# Patient Record
Sex: Female | Born: 1940 | Race: White | Hispanic: No | Marital: Single | Smoking: Former smoker
Health system: Southern US, Community
[De-identification: ages and names within clinical notes are randomized; demographics above are authoritative.]

## PROBLEM LIST (undated history)

## (undated) DIAGNOSIS — C801 Malignant (primary) neoplasm, unspecified: Secondary | ICD-10-CM

## (undated) DIAGNOSIS — J449 Chronic obstructive pulmonary disease, unspecified: Secondary | ICD-10-CM

## (undated) DIAGNOSIS — I10 Essential (primary) hypertension: Secondary | ICD-10-CM

## (undated) DIAGNOSIS — I509 Heart failure, unspecified: Secondary | ICD-10-CM

## (undated) DIAGNOSIS — F32A Depression, unspecified: Secondary | ICD-10-CM

## (undated) DIAGNOSIS — H9190 Unspecified hearing loss, unspecified ear: Secondary | ICD-10-CM

## (undated) DIAGNOSIS — F329 Major depressive disorder, single episode, unspecified: Secondary | ICD-10-CM

## (undated) DIAGNOSIS — E119 Type 2 diabetes mellitus without complications: Secondary | ICD-10-CM

## (undated) HISTORY — DX: Heart failure, unspecified: I50.9

## (undated) HISTORY — DX: Depression, unspecified: F32.A

## (undated) HISTORY — DX: Major depressive disorder, single episode, unspecified: F32.9

## (undated) HISTORY — DX: Unspecified hearing loss, unspecified ear: H91.90

---

## 2004-04-17 ENCOUNTER — Inpatient Hospital Stay: Payer: Self-pay | Admitting: Internal Medicine

## 2004-04-17 ENCOUNTER — Other Ambulatory Visit: Payer: Self-pay

## 2004-04-18 ENCOUNTER — Other Ambulatory Visit: Payer: Self-pay

## 2004-04-20 ENCOUNTER — Other Ambulatory Visit: Payer: Self-pay

## 2004-05-06 ENCOUNTER — Inpatient Hospital Stay: Payer: Self-pay | Admitting: Surgery

## 2008-09-02 ENCOUNTER — Emergency Department: Payer: Self-pay | Admitting: Emergency Medicine

## 2009-12-23 ENCOUNTER — Ambulatory Visit: Payer: Self-pay

## 2010-04-25 ENCOUNTER — Emergency Department: Payer: Self-pay | Admitting: Emergency Medicine

## 2010-04-27 ENCOUNTER — Inpatient Hospital Stay: Payer: Self-pay | Admitting: Internal Medicine

## 2010-06-10 ENCOUNTER — Ambulatory Visit: Payer: Self-pay | Admitting: Gastroenterology

## 2010-06-14 LAB — PATHOLOGY REPORT

## 2010-07-05 ENCOUNTER — Inpatient Hospital Stay: Payer: Self-pay | Admitting: Internal Medicine

## 2010-09-09 ENCOUNTER — Inpatient Hospital Stay: Payer: Self-pay | Admitting: Internal Medicine

## 2010-10-05 ENCOUNTER — Ambulatory Visit: Payer: Self-pay | Admitting: Internal Medicine

## 2011-03-22 ENCOUNTER — Emergency Department: Payer: Self-pay | Admitting: Emergency Medicine

## 2011-03-22 LAB — CBC
HCT: 35.4 % (ref 35.0–47.0)
HGB: 11.3 g/dL — ABNORMAL LOW (ref 12.0–16.0)
MCH: 29.2 pg (ref 26.0–34.0)
RBC: 3.87 10*6/uL (ref 3.80–5.20)
RDW: 15.4 % — ABNORMAL HIGH (ref 11.5–14.5)

## 2011-03-22 LAB — BASIC METABOLIC PANEL
BUN: 17 mg/dL (ref 7–18)
Calcium, Total: 9.4 mg/dL (ref 8.5–10.1)
Creatinine: 1.05 mg/dL (ref 0.60–1.30)
EGFR (African American): >60
EGFR (Non-African Amer.): 55 — ABNORMAL LOW
Glucose: 137 mg/dL — ABNORMAL HIGH (ref 65–99)
Osmolality: 291 (ref 275–301)
Potassium: 3.9 mmol/L (ref 3.5–5.1)

## 2012-01-04 ENCOUNTER — Ambulatory Visit: Payer: Self-pay | Admitting: Ophthalmology

## 2013-04-07 ENCOUNTER — Emergency Department: Payer: Self-pay | Admitting: Emergency Medicine

## 2014-01-29 ENCOUNTER — Emergency Department: Payer: Self-pay | Admitting: Internal Medicine

## 2014-01-29 LAB — CBC
HCT: 39.5 % (ref 35.0–47.0)
HGB: 12.4 g/dL (ref 12.0–16.0)
MCH: 29.7 pg (ref 26.0–34.0)
MCHC: 31.4 g/dL — AB (ref 32.0–36.0)
MCV: 95 fL (ref 80–100)
PLATELETS: 209 10*3/uL (ref 150–440)
RBC: 4.17 10*6/uL (ref 3.80–5.20)
RDW: 14.1 % (ref 11.5–14.5)
WBC: 6.3 10*3/uL (ref 3.6–11.0)

## 2014-01-29 LAB — COMPREHENSIVE METABOLIC PANEL
ALT: 36 U/L
Albumin: 3.6 g/dL (ref 3.4–5.0)
Alkaline Phosphatase: 105 U/L
Anion Gap: 5 — ABNORMAL LOW (ref 7–16)
BILIRUBIN TOTAL: 0.2 mg/dL (ref 0.2–1.0)
BUN: 10 mg/dL (ref 7–18)
Calcium, Total: 9.2 mg/dL (ref 8.5–10.1)
Chloride: 103 mmol/L (ref 98–107)
Co2: 33 mmol/L — ABNORMAL HIGH (ref 21–32)
Creatinine: 0.88 mg/dL (ref 0.60–1.30)
EGFR (Non-African Amer.): >60
Glucose: 132 mg/dL — ABNORMAL HIGH (ref 65–99)
Osmolality: 282 (ref 275–301)
Potassium: 3.8 mmol/L (ref 3.5–5.1)
SGOT(AST): 22 U/L (ref 15–37)
Sodium: 141 mmol/L (ref 136–145)
Total Protein: 7.3 g/dL (ref 6.4–8.2)

## 2014-01-29 LAB — TROPONIN I: Troponin-I: <0.02 ng/mL

## 2014-03-09 DIAGNOSIS — J449 Chronic obstructive pulmonary disease, unspecified: Secondary | ICD-10-CM | POA: Diagnosis not present

## 2014-03-09 DIAGNOSIS — J9610 Chronic respiratory failure, unspecified whether with hypoxia or hypercapnia: Secondary | ICD-10-CM | POA: Diagnosis not present

## 2014-03-09 DIAGNOSIS — M7989 Other specified soft tissue disorders: Secondary | ICD-10-CM | POA: Diagnosis not present

## 2014-03-09 DIAGNOSIS — R0902 Hypoxemia: Secondary | ICD-10-CM | POA: Diagnosis not present

## 2014-03-11 DIAGNOSIS — G4733 Obstructive sleep apnea (adult) (pediatric): Secondary | ICD-10-CM | POA: Diagnosis not present

## 2014-03-18 DIAGNOSIS — J449 Chronic obstructive pulmonary disease, unspecified: Secondary | ICD-10-CM | POA: Diagnosis not present

## 2014-03-18 DIAGNOSIS — J9610 Chronic respiratory failure, unspecified whether with hypoxia or hypercapnia: Secondary | ICD-10-CM | POA: Diagnosis not present

## 2014-03-18 DIAGNOSIS — R0902 Hypoxemia: Secondary | ICD-10-CM | POA: Diagnosis not present

## 2014-03-18 DIAGNOSIS — M7989 Other specified soft tissue disorders: Secondary | ICD-10-CM | POA: Diagnosis not present

## 2014-03-18 DIAGNOSIS — G4733 Obstructive sleep apnea (adult) (pediatric): Secondary | ICD-10-CM | POA: Diagnosis not present

## 2014-03-26 DIAGNOSIS — E039 Hypothyroidism, unspecified: Secondary | ICD-10-CM | POA: Diagnosis not present

## 2014-03-26 DIAGNOSIS — F411 Generalized anxiety disorder: Secondary | ICD-10-CM | POA: Diagnosis not present

## 2014-03-26 DIAGNOSIS — E782 Mixed hyperlipidemia: Secondary | ICD-10-CM | POA: Diagnosis not present

## 2014-03-26 DIAGNOSIS — N390 Urinary tract infection, site not specified: Secondary | ICD-10-CM | POA: Diagnosis not present

## 2014-03-26 DIAGNOSIS — E1165 Type 2 diabetes mellitus with hyperglycemia: Secondary | ICD-10-CM | POA: Diagnosis not present

## 2014-04-09 DIAGNOSIS — M7989 Other specified soft tissue disorders: Secondary | ICD-10-CM | POA: Diagnosis not present

## 2014-04-09 DIAGNOSIS — J9610 Chronic respiratory failure, unspecified whether with hypoxia or hypercapnia: Secondary | ICD-10-CM | POA: Diagnosis not present

## 2014-04-09 DIAGNOSIS — R0902 Hypoxemia: Secondary | ICD-10-CM | POA: Diagnosis not present

## 2014-04-09 DIAGNOSIS — J449 Chronic obstructive pulmonary disease, unspecified: Secondary | ICD-10-CM | POA: Diagnosis not present

## 2014-04-11 DIAGNOSIS — G4733 Obstructive sleep apnea (adult) (pediatric): Secondary | ICD-10-CM | POA: Diagnosis not present

## 2014-05-08 DIAGNOSIS — M7989 Other specified soft tissue disorders: Secondary | ICD-10-CM | POA: Diagnosis not present

## 2014-05-08 DIAGNOSIS — J449 Chronic obstructive pulmonary disease, unspecified: Secondary | ICD-10-CM | POA: Diagnosis not present

## 2014-05-08 DIAGNOSIS — R0902 Hypoxemia: Secondary | ICD-10-CM | POA: Diagnosis not present

## 2014-05-08 DIAGNOSIS — J9610 Chronic respiratory failure, unspecified whether with hypoxia or hypercapnia: Secondary | ICD-10-CM | POA: Diagnosis not present

## 2014-05-10 DIAGNOSIS — G4733 Obstructive sleep apnea (adult) (pediatric): Secondary | ICD-10-CM | POA: Diagnosis not present

## 2014-06-08 DIAGNOSIS — R0902 Hypoxemia: Secondary | ICD-10-CM | POA: Diagnosis not present

## 2014-06-08 DIAGNOSIS — J9610 Chronic respiratory failure, unspecified whether with hypoxia or hypercapnia: Secondary | ICD-10-CM | POA: Diagnosis not present

## 2014-06-08 DIAGNOSIS — J449 Chronic obstructive pulmonary disease, unspecified: Secondary | ICD-10-CM | POA: Diagnosis not present

## 2014-06-08 DIAGNOSIS — M7989 Other specified soft tissue disorders: Secondary | ICD-10-CM | POA: Diagnosis not present

## 2014-06-10 DIAGNOSIS — G4733 Obstructive sleep apnea (adult) (pediatric): Secondary | ICD-10-CM | POA: Diagnosis not present

## 2014-07-05 DIAGNOSIS — G4733 Obstructive sleep apnea (adult) (pediatric): Secondary | ICD-10-CM | POA: Diagnosis not present

## 2014-07-08 DIAGNOSIS — J9610 Chronic respiratory failure, unspecified whether with hypoxia or hypercapnia: Secondary | ICD-10-CM | POA: Diagnosis not present

## 2014-07-08 DIAGNOSIS — M7989 Other specified soft tissue disorders: Secondary | ICD-10-CM | POA: Diagnosis not present

## 2014-07-08 DIAGNOSIS — J449 Chronic obstructive pulmonary disease, unspecified: Secondary | ICD-10-CM | POA: Diagnosis not present

## 2014-07-08 DIAGNOSIS — R0902 Hypoxemia: Secondary | ICD-10-CM | POA: Diagnosis not present

## 2014-07-10 DIAGNOSIS — G4733 Obstructive sleep apnea (adult) (pediatric): Secondary | ICD-10-CM | POA: Diagnosis not present

## 2014-07-16 DIAGNOSIS — E119 Type 2 diabetes mellitus without complications: Secondary | ICD-10-CM | POA: Diagnosis not present

## 2014-07-16 DIAGNOSIS — Z0001 Encounter for general adult medical examination with abnormal findings: Secondary | ICD-10-CM | POA: Diagnosis not present

## 2014-07-16 DIAGNOSIS — E782 Mixed hyperlipidemia: Secondary | ICD-10-CM | POA: Diagnosis not present

## 2014-07-16 DIAGNOSIS — I10 Essential (primary) hypertension: Secondary | ICD-10-CM | POA: Diagnosis not present

## 2014-07-16 DIAGNOSIS — R300 Dysuria: Secondary | ICD-10-CM | POA: Diagnosis not present

## 2014-07-16 DIAGNOSIS — E039 Hypothyroidism, unspecified: Secondary | ICD-10-CM | POA: Diagnosis not present

## 2014-07-16 DIAGNOSIS — M150 Primary generalized (osteo)arthritis: Secondary | ICD-10-CM | POA: Diagnosis not present

## 2014-08-08 DIAGNOSIS — J449 Chronic obstructive pulmonary disease, unspecified: Secondary | ICD-10-CM | POA: Diagnosis not present

## 2014-08-08 DIAGNOSIS — J9610 Chronic respiratory failure, unspecified whether with hypoxia or hypercapnia: Secondary | ICD-10-CM | POA: Diagnosis not present

## 2014-08-08 DIAGNOSIS — R0902 Hypoxemia: Secondary | ICD-10-CM | POA: Diagnosis not present

## 2014-08-08 DIAGNOSIS — M7989 Other specified soft tissue disorders: Secondary | ICD-10-CM | POA: Diagnosis not present

## 2014-08-10 DIAGNOSIS — G4733 Obstructive sleep apnea (adult) (pediatric): Secondary | ICD-10-CM | POA: Diagnosis not present

## 2014-09-07 DIAGNOSIS — J9610 Chronic respiratory failure, unspecified whether with hypoxia or hypercapnia: Secondary | ICD-10-CM | POA: Diagnosis not present

## 2014-09-07 DIAGNOSIS — M7989 Other specified soft tissue disorders: Secondary | ICD-10-CM | POA: Diagnosis not present

## 2014-09-07 DIAGNOSIS — R0902 Hypoxemia: Secondary | ICD-10-CM | POA: Diagnosis not present

## 2014-09-07 DIAGNOSIS — J449 Chronic obstructive pulmonary disease, unspecified: Secondary | ICD-10-CM | POA: Diagnosis not present

## 2014-09-09 DIAGNOSIS — G4733 Obstructive sleep apnea (adult) (pediatric): Secondary | ICD-10-CM | POA: Diagnosis not present

## 2014-10-08 DIAGNOSIS — J449 Chronic obstructive pulmonary disease, unspecified: Secondary | ICD-10-CM | POA: Diagnosis not present

## 2014-10-08 DIAGNOSIS — M7989 Other specified soft tissue disorders: Secondary | ICD-10-CM | POA: Diagnosis not present

## 2014-10-08 DIAGNOSIS — J9610 Chronic respiratory failure, unspecified whether with hypoxia or hypercapnia: Secondary | ICD-10-CM | POA: Diagnosis not present

## 2014-10-08 DIAGNOSIS — R0902 Hypoxemia: Secondary | ICD-10-CM | POA: Diagnosis not present

## 2014-10-10 DIAGNOSIS — G4733 Obstructive sleep apnea (adult) (pediatric): Secondary | ICD-10-CM | POA: Diagnosis not present

## 2014-11-08 DIAGNOSIS — J449 Chronic obstructive pulmonary disease, unspecified: Secondary | ICD-10-CM | POA: Diagnosis not present

## 2014-11-08 DIAGNOSIS — R0902 Hypoxemia: Secondary | ICD-10-CM | POA: Diagnosis not present

## 2014-11-08 DIAGNOSIS — M7989 Other specified soft tissue disorders: Secondary | ICD-10-CM | POA: Diagnosis not present

## 2014-11-08 DIAGNOSIS — J9610 Chronic respiratory failure, unspecified whether with hypoxia or hypercapnia: Secondary | ICD-10-CM | POA: Diagnosis not present

## 2014-11-10 DIAGNOSIS — G4733 Obstructive sleep apnea (adult) (pediatric): Secondary | ICD-10-CM | POA: Diagnosis not present

## 2014-11-13 DIAGNOSIS — J309 Allergic rhinitis, unspecified: Secondary | ICD-10-CM | POA: Diagnosis not present

## 2014-11-13 DIAGNOSIS — I10 Essential (primary) hypertension: Secondary | ICD-10-CM | POA: Diagnosis not present

## 2014-11-13 DIAGNOSIS — H6693 Otitis media, unspecified, bilateral: Secondary | ICD-10-CM | POA: Diagnosis not present

## 2014-11-13 DIAGNOSIS — E119 Type 2 diabetes mellitus without complications: Secondary | ICD-10-CM | POA: Diagnosis not present

## 2014-11-13 DIAGNOSIS — J449 Chronic obstructive pulmonary disease, unspecified: Secondary | ICD-10-CM | POA: Diagnosis not present

## 2014-12-08 DIAGNOSIS — J449 Chronic obstructive pulmonary disease, unspecified: Secondary | ICD-10-CM | POA: Diagnosis not present

## 2014-12-08 DIAGNOSIS — M7989 Other specified soft tissue disorders: Secondary | ICD-10-CM | POA: Diagnosis not present

## 2014-12-08 DIAGNOSIS — R0902 Hypoxemia: Secondary | ICD-10-CM | POA: Diagnosis not present

## 2014-12-08 DIAGNOSIS — J9610 Chronic respiratory failure, unspecified whether with hypoxia or hypercapnia: Secondary | ICD-10-CM | POA: Diagnosis not present

## 2014-12-10 DIAGNOSIS — G4733 Obstructive sleep apnea (adult) (pediatric): Secondary | ICD-10-CM | POA: Diagnosis not present

## 2014-12-11 ENCOUNTER — Emergency Department
Admission: EM | Admit: 2014-12-11 | Discharge: 2014-12-11 | Disposition: A | Discharge status: Home / Self Care | Payer: Medicare Other | Attending: Emergency Medicine | Admitting: Emergency Medicine

## 2014-12-11 DIAGNOSIS — Z760 Encounter for issue of repeat prescription: Secondary | ICD-10-CM | POA: Diagnosis not present

## 2014-12-11 DIAGNOSIS — Z87891 Personal history of nicotine dependence: Secondary | ICD-10-CM | POA: Diagnosis not present

## 2014-12-11 DIAGNOSIS — F419 Anxiety disorder, unspecified: Secondary | ICD-10-CM | POA: Insufficient documentation

## 2014-12-11 DIAGNOSIS — R4182 Altered mental status, unspecified: Secondary | ICD-10-CM | POA: Diagnosis not present

## 2014-12-11 MED ORDER — CLONAZEPAM 1 MG PO TABS: 1.0000 mg | ORAL_TABLET | Freq: Two times a day (BID) | ORAL | Status: DC | PRN

## 2014-12-11 MED ORDER — LORAZEPAM 2 MG/ML IJ SOLN
1.0000 mg | Freq: Once | INTRAMUSCULAR | Status: AC
  Administered 2014-12-11: 1 mg via INTRAVENOUS
  Filled 2014-12-11: qty 1

## 2014-12-11 NOTE — ED Notes (Signed)
BEHAVIORAL HEALTH ROUNDING Patient sleeping: No. Patient alert and oriented: yes Behavior appropriate: Yes.  ; If no, describe:  Nutrition and fluids offered: yes Toileting and hygiene offered: Yes  Sitter present: q15 minute observations and security camera monitoring Law enforcement present: Yes  ODS  

## 2014-12-11 NOTE — ED Provider Notes (Signed)
[facility] Emergency Department Provider Note  ____________________________________________   I have reviewed the triage vital signs and the nursing notes.   HISTORY  Chief Complaint Medication Refill    HPI Lynn Robbins is a 74 y.o. female with a history of anxiety he states that she is at the end of her 90 day course of clonazepam. She cannot get it refilled until the 30th. Today's 26. She did not have her dose this morning. She did not have it last night. She states that she did not take extra, she states that sometimes she has difficulty with her pills and they called the floor. She believed her son may have actually vacuumed some up. The patient has had no withdrawal symptoms, she feels anxious but she does not feel tremulous or markedly tachycardic. No history of seizures.  No past medical history on file.  There are no active problems to display for this patient.   No past surgical history on file.  No current outpatient prescriptions on file.  Allergies Review of patient's allergies indicates not on file.  No family history on file.  Social History Social History  Substance Use Topics  . Smoking status: Former Research scientist (life sciences)  . Smokeless tobacco: Not on file  . Alcohol Use: Not on file    Review of Systems Constitutional: No fever/chills Eyes: No visual changes. ENT: No sore throat. No stiff neck no neck pain Cardiovascular: Denies chest pain. Respiratory: Denies shortness of breath. Gastrointestinal:   no vomiting.  No diarrhea.  No constipation. Genitourinary: Negative for dysuria. Musculoskeletal: Negative lower extremity swelling Skin: Negative for rash. Neurological: Negative for headaches, focal weakness or numbness. 10-point ROS otherwise negative.  ____________________________________________   PHYSICAL EXAM:  VITAL SIGNS: ED Triage Vitals  Enc Vitals Group     BP 12/11/14 1345 123/89 mmHg     Pulse Rate 12/11/14 1345 105      Resp 12/11/14 1345 20     Temp 12/11/14 1345 98.6 F (37 C)     Temp Source 12/11/14 1345 Oral     SpO2 12/11/14 1345 97 %     Weight 12/11/14 1340 196 lb (88.905 kg)     Height 12/11/14 1340 '5\' 2"'$  (1.575 m)     Head Cir --      Peak Flow --      Pain Score --      Pain Loc --      Pain Edu? --      Excl. in [place]? --     Constitutional: Alert and oriented. Well appearing and in no acute distress. Eyes: Conjunctivae are normal. PERRL. EOMI. Head: Atraumatic. Nose: No congestion/rhinnorhea. Mouth/Throat: Mucous membranes are moist.  Oropharynx non-erythematous. Neck: No stridor.   Nontender with no meningismus Cardiovascular: Normal rate, regular rhythm. Grossly normal heart sounds.  Good peripheral circulation. Respiratory: Normal respiratory effort.  No retractions. Lungs CTAB. Gastrointestinal: Soft and nontender. No distention. No guarding no rebound Back:  There is no focal tenderness or step off there is no midline tenderness there are no lesions noted. there is no CVA tenderness Musculoskeletal: No lower extremity tenderness. No joint effusions, no DVT signs strong distal pulses no edema Neurologic:  Normal speech and language. No gross focal neurologic deficits are appreciated. No tremor noted Skin:  Skin is warm, dry and intact. No rash noted. Psychiatric: Mood and affect are normal. Speech and behavior are normal.  ____________________________________________   LABS (all labs ordered are listed, but only abnormal results  are displayed)  Labs Reviewed - No data to display ____________________________________________  EKG   ____________________________________________  RADIOLOGY   ____________________________________________   PROCEDURES  Procedure(s) performed: None  Critical Care performed: None  ____________________________________________   INITIAL IMPRESSION / ASSESSMENT AND PLAN / ED COURSE  Pertinent labs & imaging results that were  available during my care of the patient were reviewed by me and considered in my medical decision making (see chart for details).  Patient here with a few days without her Klonopin. She is at risk for withdrawal, heart rate was initially 105 at this time it is in the high 90s, and she is quite anxious. There is no tremor or anything to suggest that the patient is hypertonic or in benzodiazepine withdrawal. We will give her a dose of Ativan here, and I will see if I can send her home with a replacement prescription for that which she is missing. Advised her that this is not a routine thing that we do in the emergency room, however, I do not do it there is a risk of withdrawal. ____________________________________________   FINAL CLINICAL IMPRESSION(S) / ED DIAGNOSES  Final diagnoses:  None     Schuyler Amor, MD 12/11/14 1443

## 2014-12-11 NOTE — Discharge Instructions (Signed)
Medicine Refill at the Emergency Department We have refilled your medicine today, but it is best for you to get refills through your primary health care provider's office. In the future, please plan ahead so you do not need to get refills from the emergency department. If the medicine we refilled was a maintenance medicine, you may have received only enough to get you by until you are able to see your regular health care provider.   This information is not intended to replace advice given to you by your health care provider. Make sure you discuss any questions you have with your health care provider.  If you have shakes or chills or tremors or you feel otherwise unwell including vomiting return to the emergency room   Document Released: 05/21/2003 Document Revised: 02/22/2014 Document Reviewed: 05/11/2013 Elsevier Interactive Patient Education 2016 [employer].

## 2014-12-11 NOTE — ED Notes (Signed)

## 2014-12-11 NOTE — ED Notes (Signed)
Pt here via EMS from home  Pt reports that she is out of clonazepam and she has been out for two days  "i need something to make me feel better - I have high anxiety - I can't get my medicine refilled until the 30th and my insurance will not pay - so i came here."   Pt with loud speech throughout triage

## 2015-01-08 DIAGNOSIS — J449 Chronic obstructive pulmonary disease, unspecified: Secondary | ICD-10-CM | POA: Diagnosis not present

## 2015-01-08 DIAGNOSIS — R0902 Hypoxemia: Secondary | ICD-10-CM | POA: Diagnosis not present

## 2015-01-08 DIAGNOSIS — M7989 Other specified soft tissue disorders: Secondary | ICD-10-CM | POA: Diagnosis not present

## 2015-01-08 DIAGNOSIS — J9610 Chronic respiratory failure, unspecified whether with hypoxia or hypercapnia: Secondary | ICD-10-CM | POA: Diagnosis not present

## 2015-01-10 DIAGNOSIS — G4733 Obstructive sleep apnea (adult) (pediatric): Secondary | ICD-10-CM | POA: Diagnosis not present

## 2015-02-07 DIAGNOSIS — R0902 Hypoxemia: Secondary | ICD-10-CM | POA: Diagnosis not present

## 2015-02-07 DIAGNOSIS — J9610 Chronic respiratory failure, unspecified whether with hypoxia or hypercapnia: Secondary | ICD-10-CM | POA: Diagnosis not present

## 2015-02-07 DIAGNOSIS — J449 Chronic obstructive pulmonary disease, unspecified: Secondary | ICD-10-CM | POA: Diagnosis not present

## 2015-02-07 DIAGNOSIS — M7989 Other specified soft tissue disorders: Secondary | ICD-10-CM | POA: Diagnosis not present

## 2015-02-09 DIAGNOSIS — G4733 Obstructive sleep apnea (adult) (pediatric): Secondary | ICD-10-CM | POA: Diagnosis not present

## 2015-02-13 DIAGNOSIS — F411 Generalized anxiety disorder: Secondary | ICD-10-CM | POA: Diagnosis not present

## 2015-02-13 DIAGNOSIS — I10 Essential (primary) hypertension: Secondary | ICD-10-CM | POA: Diagnosis not present

## 2015-02-13 DIAGNOSIS — M545 Low back pain: Secondary | ICD-10-CM | POA: Diagnosis not present

## 2015-02-13 DIAGNOSIS — G2581 Restless legs syndrome: Secondary | ICD-10-CM | POA: Diagnosis not present

## 2015-02-13 DIAGNOSIS — E1165 Type 2 diabetes mellitus with hyperglycemia: Secondary | ICD-10-CM | POA: Diagnosis not present

## 2015-02-24 DIAGNOSIS — G4733 Obstructive sleep apnea (adult) (pediatric): Secondary | ICD-10-CM | POA: Diagnosis not present

## 2015-03-10 DIAGNOSIS — M7989 Other specified soft tissue disorders: Secondary | ICD-10-CM | POA: Diagnosis not present

## 2015-03-10 DIAGNOSIS — J9610 Chronic respiratory failure, unspecified whether with hypoxia or hypercapnia: Secondary | ICD-10-CM | POA: Diagnosis not present

## 2015-03-10 DIAGNOSIS — R0902 Hypoxemia: Secondary | ICD-10-CM | POA: Diagnosis not present

## 2015-03-10 DIAGNOSIS — J449 Chronic obstructive pulmonary disease, unspecified: Secondary | ICD-10-CM | POA: Diagnosis not present

## 2015-03-12 DIAGNOSIS — G4733 Obstructive sleep apnea (adult) (pediatric): Secondary | ICD-10-CM | POA: Diagnosis not present

## 2015-04-10 DIAGNOSIS — J449 Chronic obstructive pulmonary disease, unspecified: Secondary | ICD-10-CM | POA: Diagnosis not present

## 2015-04-10 DIAGNOSIS — R0902 Hypoxemia: Secondary | ICD-10-CM | POA: Diagnosis not present

## 2015-04-10 DIAGNOSIS — M7989 Other specified soft tissue disorders: Secondary | ICD-10-CM | POA: Diagnosis not present

## 2015-04-10 DIAGNOSIS — J9610 Chronic respiratory failure, unspecified whether with hypoxia or hypercapnia: Secondary | ICD-10-CM | POA: Diagnosis not present

## 2015-04-12 DIAGNOSIS — G4733 Obstructive sleep apnea (adult) (pediatric): Secondary | ICD-10-CM | POA: Diagnosis not present

## 2015-05-08 DIAGNOSIS — R0902 Hypoxemia: Secondary | ICD-10-CM | POA: Diagnosis not present

## 2015-05-08 DIAGNOSIS — M7989 Other specified soft tissue disorders: Secondary | ICD-10-CM | POA: Diagnosis not present

## 2015-05-08 DIAGNOSIS — J449 Chronic obstructive pulmonary disease, unspecified: Secondary | ICD-10-CM | POA: Diagnosis not present

## 2015-05-08 DIAGNOSIS — J9610 Chronic respiratory failure, unspecified whether with hypoxia or hypercapnia: Secondary | ICD-10-CM | POA: Diagnosis not present

## 2015-05-10 DIAGNOSIS — G4733 Obstructive sleep apnea (adult) (pediatric): Secondary | ICD-10-CM | POA: Diagnosis not present

## 2015-05-14 DIAGNOSIS — E1165 Type 2 diabetes mellitus with hyperglycemia: Secondary | ICD-10-CM | POA: Diagnosis not present

## 2015-05-28 DIAGNOSIS — G4733 Obstructive sleep apnea (adult) (pediatric): Secondary | ICD-10-CM | POA: Diagnosis not present

## 2015-06-08 DIAGNOSIS — R0902 Hypoxemia: Secondary | ICD-10-CM | POA: Diagnosis not present

## 2015-06-08 DIAGNOSIS — J9610 Chronic respiratory failure, unspecified whether with hypoxia or hypercapnia: Secondary | ICD-10-CM | POA: Diagnosis not present

## 2015-06-08 DIAGNOSIS — M7989 Other specified soft tissue disorders: Secondary | ICD-10-CM | POA: Diagnosis not present

## 2015-06-08 DIAGNOSIS — J449 Chronic obstructive pulmonary disease, unspecified: Secondary | ICD-10-CM | POA: Diagnosis not present

## 2015-06-10 DIAGNOSIS — G4733 Obstructive sleep apnea (adult) (pediatric): Secondary | ICD-10-CM | POA: Diagnosis not present

## 2015-07-08 DIAGNOSIS — M7989 Other specified soft tissue disorders: Secondary | ICD-10-CM | POA: Diagnosis not present

## 2015-07-08 DIAGNOSIS — R0902 Hypoxemia: Secondary | ICD-10-CM | POA: Diagnosis not present

## 2015-07-08 DIAGNOSIS — J449 Chronic obstructive pulmonary disease, unspecified: Secondary | ICD-10-CM | POA: Diagnosis not present

## 2015-07-08 DIAGNOSIS — J9610 Chronic respiratory failure, unspecified whether with hypoxia or hypercapnia: Secondary | ICD-10-CM | POA: Diagnosis not present

## 2015-07-10 DIAGNOSIS — G4733 Obstructive sleep apnea (adult) (pediatric): Secondary | ICD-10-CM | POA: Diagnosis not present

## 2015-07-18 DIAGNOSIS — I10 Essential (primary) hypertension: Secondary | ICD-10-CM | POA: Diagnosis not present

## 2015-07-18 DIAGNOSIS — E119 Type 2 diabetes mellitus without complications: Secondary | ICD-10-CM | POA: Diagnosis not present

## 2015-07-18 DIAGNOSIS — M545 Low back pain: Secondary | ICD-10-CM | POA: Diagnosis not present

## 2015-07-18 DIAGNOSIS — Z0001 Encounter for general adult medical examination with abnormal findings: Secondary | ICD-10-CM | POA: Diagnosis not present

## 2015-07-18 DIAGNOSIS — J449 Chronic obstructive pulmonary disease, unspecified: Secondary | ICD-10-CM | POA: Diagnosis not present

## 2015-07-18 DIAGNOSIS — Z0000 Encounter for general adult medical examination without abnormal findings: Secondary | ICD-10-CM | POA: Diagnosis not present

## 2015-07-18 DIAGNOSIS — Z9981 Dependence on supplemental oxygen: Secondary | ICD-10-CM | POA: Diagnosis not present

## 2015-08-08 DIAGNOSIS — J449 Chronic obstructive pulmonary disease, unspecified: Secondary | ICD-10-CM | POA: Diagnosis not present

## 2015-08-08 DIAGNOSIS — J9610 Chronic respiratory failure, unspecified whether with hypoxia or hypercapnia: Secondary | ICD-10-CM | POA: Diagnosis not present

## 2015-08-08 DIAGNOSIS — M7989 Other specified soft tissue disorders: Secondary | ICD-10-CM | POA: Diagnosis not present

## 2015-08-08 DIAGNOSIS — R0902 Hypoxemia: Secondary | ICD-10-CM | POA: Diagnosis not present

## 2015-08-10 DIAGNOSIS — G4733 Obstructive sleep apnea (adult) (pediatric): Secondary | ICD-10-CM | POA: Diagnosis not present

## 2015-08-12 DIAGNOSIS — J449 Chronic obstructive pulmonary disease, unspecified: Secondary | ICD-10-CM | POA: Diagnosis not present

## 2015-08-12 DIAGNOSIS — E119 Type 2 diabetes mellitus without complications: Secondary | ICD-10-CM | POA: Diagnosis not present

## 2015-08-12 DIAGNOSIS — Z9981 Dependence on supplemental oxygen: Secondary | ICD-10-CM | POA: Diagnosis not present

## 2015-08-12 DIAGNOSIS — I10 Essential (primary) hypertension: Secondary | ICD-10-CM | POA: Diagnosis not present

## 2015-08-12 DIAGNOSIS — M150 Primary generalized (osteo)arthritis: Secondary | ICD-10-CM | POA: Diagnosis not present

## 2015-08-15 ENCOUNTER — Encounter: Payer: Self-pay | Admitting: Emergency Medicine

## 2015-08-15 ENCOUNTER — Emergency Department
Admission: EM | Admit: 2015-08-15 | Discharge: 2015-08-15 | Disposition: A | Discharge status: Home / Self Care | Payer: Medicare Other | Attending: Emergency Medicine | Admitting: Emergency Medicine

## 2015-08-15 ENCOUNTER — Emergency Department: Payer: Medicare Other

## 2015-08-15 DIAGNOSIS — J449 Chronic obstructive pulmonary disease, unspecified: Secondary | ICD-10-CM | POA: Insufficient documentation

## 2015-08-15 DIAGNOSIS — E119 Type 2 diabetes mellitus without complications: Secondary | ICD-10-CM | POA: Diagnosis not present

## 2015-08-15 DIAGNOSIS — Z87891 Personal history of nicotine dependence: Secondary | ICD-10-CM | POA: Insufficient documentation

## 2015-08-15 DIAGNOSIS — M79602 Pain in left arm: Secondary | ICD-10-CM

## 2015-08-15 DIAGNOSIS — R079 Chest pain, unspecified: Secondary | ICD-10-CM

## 2015-08-15 DIAGNOSIS — R0789 Other chest pain: Secondary | ICD-10-CM | POA: Diagnosis not present

## 2015-08-15 DIAGNOSIS — M79622 Pain in left upper arm: Secondary | ICD-10-CM | POA: Diagnosis not present

## 2015-08-15 DIAGNOSIS — I10 Essential (primary) hypertension: Secondary | ICD-10-CM | POA: Diagnosis not present

## 2015-08-15 HISTORY — DX: Type 2 diabetes mellitus without complications: E11.9

## 2015-08-15 HISTORY — DX: Chronic obstructive pulmonary disease, unspecified: J44.9

## 2015-08-15 HISTORY — DX: Essential (primary) hypertension: I10

## 2015-08-15 LAB — BASIC METABOLIC PANEL
ANION GAP: 7 (ref 5–15)
BUN: 19 mg/dL (ref 6–20)
CALCIUM: 9.3 mg/dL (ref 8.9–10.3)
CO2: 35 mmol/L — AB (ref 22–32)
Chloride: 99 mmol/L — ABNORMAL LOW (ref 101–111)
Creatinine, Ser: 0.76 mg/dL (ref 0.44–1.00)
GFR calc Af Amer: >60 mL/min (ref >60)
GLUCOSE: 121 mg/dL — AB (ref 65–99)
Potassium: 3.5 mmol/L (ref 3.5–5.1)
Sodium: 141 mmol/L (ref 135–145)

## 2015-08-15 LAB — CBC
HEMATOCRIT: 36 % (ref 35.0–47.0)
HEMOGLOBIN: 11.7 g/dL — AB (ref 12.0–16.0)
MCH: 30.2 pg (ref 26.0–34.0)
MCHC: 32.4 g/dL (ref 32.0–36.0)
MCV: 93.1 fL (ref 80.0–100.0)
Platelets: 217 10*3/uL (ref 150–440)
RBC: 3.87 MIL/uL (ref 3.80–5.20)
RDW: 14.3 % (ref 11.5–14.5)
WBC: 9 10*3/uL (ref 3.6–11.0)

## 2015-08-15 LAB — TROPONIN I: Troponin I: <0.03 ng/mL (ref <0.03)

## 2015-08-15 MED ORDER — OXYCODONE-ACETAMINOPHEN 5-325 MG PO TABS
1.0000 | ORAL_TABLET | Freq: Once | ORAL | Status: AC
  Administered 2015-08-15: 1 via ORAL
  Filled 2015-08-15: qty 1

## 2015-08-15 NOTE — ED Provider Notes (Addendum)
[facility] Emergency Department Provider Note  ____________________________________________   I have reviewed the triage vital signs and the nursing notes.   HISTORY  Chief Complaint Chest Pain    HPI Lynn Robbins is a 75 y.o. female with a history of hypertension COPD home oxygen diabetes, patient has left arm pain which is been there for 2 weeks. Worse when she moves the arm when she touches ER. Is in the left upper humerus region. Patient has also some shoulder pain. This is also reproducible with touch and motion. She states she's had pain before in this area multiple times with this time is lasted for 2 weeks. His been there on intractably. Nothing makes it better. It's worse when she moves her arm or lies on that side. She denies any numbness or weakness. She denies any neck pain or other radicular symptoms. Patient also says that she sometimes has pain in her left chest wall and she can very precisely designate the area of discomfort. There is about a 2 cm area of pain, is worse when she moves her arm and worse when she touches that area. It is nonradiating. Nonexertional. She denies any calf pain or leg swelling. She is at her baseline as far as her shortness of breath is concerned, she is not having a cough or any other acute complaints. Given that she has been having these 2 pains for the last 2 weeks she was sent into the emergency room for further evaluation. She denies any history of PE or DVT leg swelling or recent travel.     Past Medical History  Diagnosis Date  . Diabetes mellitus without complication ([place])   . Hypertension   . COPD (chronic obstructive pulmonary disease) (HCC)     There are no active problems to display for this patient.   Past Surgical History  Procedure Laterality Date  . Cesarean section      No current outpatient prescriptions on file.  Allergies Review of patient's allergies indicates no known allergies.  No  family history on file.  Social History Social History  Substance Use Topics  . Smoking status: Former Research scientist (life sciences)  . Smokeless tobacco: None  . Alcohol Use: No    Review of Systems Constitutional: No fever/chills Eyes: No visual changes. ENT: No sore throat. No stiff neck no neck pain Cardiovascular: Positive chest pain. Respiratory: Denies shortness of breath. Gastrointestinal:   no vomiting.  No diarrhea.  No constipation. Genitourinary: Negative for dysuria. Musculoskeletal: Negative lower extremity swelling Skin: Negative for rash. Neurological: Negative for headaches, focal weakness or numbness. 10-point ROS otherwise negative.  ____________________________________________   PHYSICAL EXAM:  VITAL SIGNS: ED Triage Vitals  Enc Vitals Group     BP 08/15/15 1129 138/69 mmHg     Pulse Rate 08/15/15 1127 62     Resp 08/15/15 1127 16     Temp --      Temp src --      SpO2 08/15/15 1122 98 %     Weight 08/15/15 1127 215 lb 4.8 oz (97.659 kg)     Height 08/15/15 1127 '5\' 1"'$  (1.549 m)     Head Cir --      Peak Flow --      Pain Score 08/15/15 1128 9     Pain Loc --      Pain Edu? --      Excl. in [place]? --     Constitutional: Alert and oriented. Well appearing and in no acute  distress. Eyes: Conjunctivae are normal. PERRL. EOMI. Head: Atraumatic. Nose: No congestion/rhinnorhea. Mouth/Throat: Mucous membranes are moist.  Oropharynx non-erythematous. Neck: No stridor.   Nontender with no meningismus Cardiovascular: Normal rate, regular rhythm. Grossly normal heart sounds.  Good peripheral circulation. Chest: Tender to palpation left chest wall costochondral margin and also on the left pectoralis muscle. Palpation of this area reproduces the patient's pain. Patient states "ouch that's the pain right there" and pulls back. Also discomfort when I raise the arm. There is no shingles there is no erythema there is no obvious lesion there is no abscess or cellulitic changes. No flail  chest crepitus. Respiratory: Normal respiratory effort.  No retractions. Lungs CTAB. Abdominal: Soft and nontender. No distention. No guarding no rebound Back:  There is no focal tenderness or step off there is no midline tenderness there are no lesions noted. there is no CVA tenderness Musculoskeletal: Mellitus palpation in the left upper shoulder region, pain when I range the arm which reproduces all of her discomfort. Strong distal pulses compartment soft. Neurovascular intact. No lower extremity tenderness. No joint effusions, no DVT signs strong distal pulses no edema Neurologic:  Normal speech and language. No gross focal neurologic deficits are appreciated.  Skin:  Skin is warm, dry and intact. No rash noted. Psychiatric: Mood and affect are normal. Speech and behavior are normal.  ____________________________________________   LABS (all labs ordered are listed, but only abnormal results are displayed)  Labs Reviewed  BASIC METABOLIC PANEL - Abnormal; Notable for the following:    Chloride 99 (*)    CO2 35 (*)    Glucose, Bld 121 (*)    All other components within normal limits  CBC - Abnormal; Notable for the following:    Hemoglobin 11.7 (*)    All other components within normal limits  TROPONIN I   ____________________________________________  EKG  I personally interpreted any EKGs ordered by me or triage Sinus rhythm rate 65 beats per min no acute ST elevation or acute ST depression normal axis unremarkable EKG ____________________________________________  RADIOLOGY  I reviewed any imaging ordered by me or triage that were performed during my shift and, if possible, patient and/or family made aware of any abnormal findings. ____________________________________________   PROCEDURES  Procedure(s) performed: None  Critical Care performed: None  ____________________________________________   INITIAL IMPRESSION / ASSESSMENT AND PLAN / ED COURSE  Pertinent labs  & imaging results that were available during my care of the patient were reviewed by me and considered in my medical decision making (see chart for details).  Patient with very reproducible arm and chest pain, this is been there for 2 weeks. Troponin is negative EKG is reassuring vital signs are reassuring.  Cardiac enzyme is her negative despite 2 weeks of discomfort. At this time, there does not appear to be clinical evidence to support the diagnosis of pulmonary embolus, dissection, myocarditis, endocarditis, pericarditis, pericardial tamponade, acute coronary syndrome, pneumothorax, pneumonia, or any other acute intrathoracic pathology that will require admission or acute intervention. Nor is there evidence of any significant intra-abdominal pathology causing this discomfort. We have discussed also the possibility of cervical radiculopathy. At this time there is no numbness or weakness however and this will need to be worked up further as an outpatient. We'll give the patient pain medication send a second cardiac enzyme given her age and risk factors as a precaution, although again very low suspicion reassess.  ----------------------------------------- 4:27 PM on 08/15/2015 -----------------------------------------  Chest x-ray, or EKG, vital signs,  serial cardiac enzymes serial abdominal exams all reassuring. Patient pain free after a single Percocet has had uninterrupted the reproducible pain for the last 2 weeks. We will refer her back to her primary care doctor but this time she does not meet criteria for emergent admission to the hospital and she very much prefer to go home. We did discuss admission however patient prefers discharge which is not unreasonable. She will follow up close with her outpatient physician and come back for new or worrisome symptoms. ____________________________________________   FINAL CLINICAL IMPRESSION(S) / ED DIAGNOSES  Final diagnoses:  Chest pain      This  chart was dictated using voice recognition software.  Despite best efforts to proofread,  errors can occur which can change meaning.     Schuyler Amor, MD 08/15/15 [place], MD 08/15/15 2105

## 2015-08-15 NOTE — Discharge Instructions (Signed)
Chest Wall Pain °Chest wall pain is pain in or around the bones and muscles of your chest. Sometimes, an injury causes this pain. Sometimes, the cause may not be known. This pain may take several weeks or longer to get better. °HOME CARE °Pay attention to any changes in your symptoms. Take these actions to help with your pain: °· Rest as told by your doctor. °· Avoid activities that cause pain. Try not to use your chest, belly (abdominal), or side muscles to lift heavy things. °· If directed, apply ice to the painful area: °¨ Put ice in a plastic bag. °¨ Place a towel between your skin and the bag. °¨ Leave the ice on for 20 minutes, 2-3 times per day. °· Take over-the-counter and prescription medicines only as told by your doctor. °· Do not use tobacco products, including cigarettes, chewing tobacco, and e-cigarettes. If you need help quitting, ask your doctor. °· Keep all follow-up visits as told by your doctor. This is important. °GET HELP IF: °· You have a fever. °· Your chest pain gets worse. °· You have new symptoms. °GET HELP RIGHT AWAY IF: °· You feel sick to your stomach (nauseous) or you throw up (vomit). °· You feel sweaty or light-headed. °· You have a cough with phlegm (sputum) or you cough up blood. °· You are short of breath. °  °This information is not intended to replace advice given to you by your health care provider. Make sure you discuss any questions you have with your health care provider. °  °Document Released: 07/21/2007 Document Revised: 10/23/2014 Document Reviewed: 04/29/2014 °Elsevier Interactive Patient Education ©2016 Else[employer] Inc[employer] ° °

## 2015-08-15 NOTE — ED Notes (Signed)
Patient brought in by [facility] from home for chest pain that radiates into the left arm for the past 2 weeks. Patient was seen by her PCP and was given medication but does not remember what it was. Patient is not currently in any distress but appears to be anxious.

## 2015-08-29 DIAGNOSIS — G4733 Obstructive sleep apnea (adult) (pediatric): Secondary | ICD-10-CM | POA: Diagnosis not present

## 2015-09-07 DIAGNOSIS — J9610 Chronic respiratory failure, unspecified whether with hypoxia or hypercapnia: Secondary | ICD-10-CM | POA: Diagnosis not present

## 2015-09-07 DIAGNOSIS — J449 Chronic obstructive pulmonary disease, unspecified: Secondary | ICD-10-CM | POA: Diagnosis not present

## 2015-09-07 DIAGNOSIS — R0902 Hypoxemia: Secondary | ICD-10-CM | POA: Diagnosis not present

## 2015-09-07 DIAGNOSIS — M7989 Other specified soft tissue disorders: Secondary | ICD-10-CM | POA: Diagnosis not present

## 2015-09-09 DIAGNOSIS — G4733 Obstructive sleep apnea (adult) (pediatric): Secondary | ICD-10-CM | POA: Diagnosis not present

## 2015-10-08 DIAGNOSIS — J9610 Chronic respiratory failure, unspecified whether with hypoxia or hypercapnia: Secondary | ICD-10-CM | POA: Diagnosis not present

## 2015-10-08 DIAGNOSIS — J449 Chronic obstructive pulmonary disease, unspecified: Secondary | ICD-10-CM | POA: Diagnosis not present

## 2015-10-08 DIAGNOSIS — M7989 Other specified soft tissue disorders: Secondary | ICD-10-CM | POA: Diagnosis not present

## 2015-10-08 DIAGNOSIS — R0902 Hypoxemia: Secondary | ICD-10-CM | POA: Diagnosis not present

## 2015-10-10 DIAGNOSIS — G4733 Obstructive sleep apnea (adult) (pediatric): Secondary | ICD-10-CM | POA: Diagnosis not present

## 2015-10-24 DIAGNOSIS — J449 Chronic obstructive pulmonary disease, unspecified: Secondary | ICD-10-CM | POA: Diagnosis not present

## 2015-10-24 DIAGNOSIS — I10 Essential (primary) hypertension: Secondary | ICD-10-CM | POA: Diagnosis not present

## 2015-10-24 DIAGNOSIS — E119 Type 2 diabetes mellitus without complications: Secondary | ICD-10-CM | POA: Diagnosis not present

## 2015-10-24 DIAGNOSIS — M150 Primary generalized (osteo)arthritis: Secondary | ICD-10-CM | POA: Diagnosis not present

## 2015-11-08 DIAGNOSIS — J449 Chronic obstructive pulmonary disease, unspecified: Secondary | ICD-10-CM | POA: Diagnosis not present

## 2015-11-08 DIAGNOSIS — R0902 Hypoxemia: Secondary | ICD-10-CM | POA: Diagnosis not present

## 2015-11-08 DIAGNOSIS — M7989 Other specified soft tissue disorders: Secondary | ICD-10-CM | POA: Diagnosis not present

## 2015-11-08 DIAGNOSIS — J9610 Chronic respiratory failure, unspecified whether with hypoxia or hypercapnia: Secondary | ICD-10-CM | POA: Diagnosis not present

## 2015-11-10 DIAGNOSIS — G4733 Obstructive sleep apnea (adult) (pediatric): Secondary | ICD-10-CM | POA: Diagnosis not present

## 2015-12-06 DIAGNOSIS — G4733 Obstructive sleep apnea (adult) (pediatric): Secondary | ICD-10-CM | POA: Diagnosis not present

## 2015-12-08 DIAGNOSIS — R0902 Hypoxemia: Secondary | ICD-10-CM | POA: Diagnosis not present

## 2015-12-08 DIAGNOSIS — M7989 Other specified soft tissue disorders: Secondary | ICD-10-CM | POA: Diagnosis not present

## 2015-12-08 DIAGNOSIS — J449 Chronic obstructive pulmonary disease, unspecified: Secondary | ICD-10-CM | POA: Diagnosis not present

## 2015-12-08 DIAGNOSIS — J9610 Chronic respiratory failure, unspecified whether with hypoxia or hypercapnia: Secondary | ICD-10-CM | POA: Diagnosis not present

## 2015-12-10 DIAGNOSIS — G4733 Obstructive sleep apnea (adult) (pediatric): Secondary | ICD-10-CM | POA: Diagnosis not present

## 2015-12-29 DIAGNOSIS — E1165 Type 2 diabetes mellitus with hyperglycemia: Secondary | ICD-10-CM | POA: Diagnosis not present

## 2015-12-29 DIAGNOSIS — I10 Essential (primary) hypertension: Secondary | ICD-10-CM | POA: Diagnosis not present

## 2015-12-29 DIAGNOSIS — Z23 Encounter for immunization: Secondary | ICD-10-CM | POA: Diagnosis not present

## 2015-12-29 DIAGNOSIS — E039 Hypothyroidism, unspecified: Secondary | ICD-10-CM | POA: Diagnosis not present

## 2015-12-29 DIAGNOSIS — E782 Mixed hyperlipidemia: Secondary | ICD-10-CM | POA: Diagnosis not present

## 2016-01-05 ENCOUNTER — Other Ambulatory Visit: Payer: Self-pay

## 2016-01-05 ENCOUNTER — Ambulatory Visit: Payer: Self-pay | Admitting: Surgery

## 2016-01-08 DIAGNOSIS — R0902 Hypoxemia: Secondary | ICD-10-CM | POA: Diagnosis not present

## 2016-01-08 DIAGNOSIS — J9610 Chronic respiratory failure, unspecified whether with hypoxia or hypercapnia: Secondary | ICD-10-CM | POA: Diagnosis not present

## 2016-01-08 DIAGNOSIS — M7989 Other specified soft tissue disorders: Secondary | ICD-10-CM | POA: Diagnosis not present

## 2016-01-08 DIAGNOSIS — J449 Chronic obstructive pulmonary disease, unspecified: Secondary | ICD-10-CM | POA: Diagnosis not present

## 2016-01-10 DIAGNOSIS — G4733 Obstructive sleep apnea (adult) (pediatric): Secondary | ICD-10-CM | POA: Diagnosis not present

## 2016-02-07 DIAGNOSIS — J449 Chronic obstructive pulmonary disease, unspecified: Secondary | ICD-10-CM | POA: Diagnosis not present

## 2016-02-07 DIAGNOSIS — M7989 Other specified soft tissue disorders: Secondary | ICD-10-CM | POA: Diagnosis not present

## 2016-02-07 DIAGNOSIS — R0902 Hypoxemia: Secondary | ICD-10-CM | POA: Diagnosis not present

## 2016-02-07 DIAGNOSIS — J9610 Chronic respiratory failure, unspecified whether with hypoxia or hypercapnia: Secondary | ICD-10-CM | POA: Diagnosis not present

## 2016-02-09 DIAGNOSIS — G4733 Obstructive sleep apnea (adult) (pediatric): Secondary | ICD-10-CM | POA: Diagnosis not present

## 2016-03-08 DIAGNOSIS — G4733 Obstructive sleep apnea (adult) (pediatric): Secondary | ICD-10-CM | POA: Diagnosis not present

## 2016-03-09 DIAGNOSIS — J449 Chronic obstructive pulmonary disease, unspecified: Secondary | ICD-10-CM | POA: Diagnosis not present

## 2016-03-09 DIAGNOSIS — R0902 Hypoxemia: Secondary | ICD-10-CM | POA: Diagnosis not present

## 2016-03-09 DIAGNOSIS — M7989 Other specified soft tissue disorders: Secondary | ICD-10-CM | POA: Diagnosis not present

## 2016-03-09 DIAGNOSIS — J9610 Chronic respiratory failure, unspecified whether with hypoxia or hypercapnia: Secondary | ICD-10-CM | POA: Diagnosis not present

## 2016-03-11 DIAGNOSIS — G4733 Obstructive sleep apnea (adult) (pediatric): Secondary | ICD-10-CM | POA: Diagnosis not present

## 2016-04-09 DIAGNOSIS — R0902 Hypoxemia: Secondary | ICD-10-CM | POA: Diagnosis not present

## 2016-04-09 DIAGNOSIS — M7989 Other specified soft tissue disorders: Secondary | ICD-10-CM | POA: Diagnosis not present

## 2016-04-09 DIAGNOSIS — J9610 Chronic respiratory failure, unspecified whether with hypoxia or hypercapnia: Secondary | ICD-10-CM | POA: Diagnosis not present

## 2016-04-09 DIAGNOSIS — J449 Chronic obstructive pulmonary disease, unspecified: Secondary | ICD-10-CM | POA: Diagnosis not present

## 2016-04-11 DIAGNOSIS — G4733 Obstructive sleep apnea (adult) (pediatric): Secondary | ICD-10-CM | POA: Diagnosis not present

## 2016-05-07 DIAGNOSIS — J9610 Chronic respiratory failure, unspecified whether with hypoxia or hypercapnia: Secondary | ICD-10-CM | POA: Diagnosis not present

## 2016-05-07 DIAGNOSIS — R0902 Hypoxemia: Secondary | ICD-10-CM | POA: Diagnosis not present

## 2016-05-07 DIAGNOSIS — M7989 Other specified soft tissue disorders: Secondary | ICD-10-CM | POA: Diagnosis not present

## 2016-05-07 DIAGNOSIS — J449 Chronic obstructive pulmonary disease, unspecified: Secondary | ICD-10-CM | POA: Diagnosis not present

## 2016-05-09 DIAGNOSIS — G4733 Obstructive sleep apnea (adult) (pediatric): Secondary | ICD-10-CM | POA: Diagnosis not present

## 2016-05-18 DIAGNOSIS — M545 Low back pain: Secondary | ICD-10-CM | POA: Diagnosis not present

## 2016-05-18 DIAGNOSIS — E1165 Type 2 diabetes mellitus with hyperglycemia: Secondary | ICD-10-CM | POA: Diagnosis not present

## 2016-05-18 DIAGNOSIS — J449 Chronic obstructive pulmonary disease, unspecified: Secondary | ICD-10-CM | POA: Diagnosis not present

## 2016-05-18 DIAGNOSIS — Z9981 Dependence on supplemental oxygen: Secondary | ICD-10-CM | POA: Diagnosis not present

## 2016-05-18 DIAGNOSIS — I10 Essential (primary) hypertension: Secondary | ICD-10-CM | POA: Diagnosis not present

## 2016-06-07 DIAGNOSIS — R0902 Hypoxemia: Secondary | ICD-10-CM | POA: Diagnosis not present

## 2016-06-07 DIAGNOSIS — J449 Chronic obstructive pulmonary disease, unspecified: Secondary | ICD-10-CM | POA: Diagnosis not present

## 2016-06-07 DIAGNOSIS — J9610 Chronic respiratory failure, unspecified whether with hypoxia or hypercapnia: Secondary | ICD-10-CM | POA: Diagnosis not present

## 2016-06-07 DIAGNOSIS — M7989 Other specified soft tissue disorders: Secondary | ICD-10-CM | POA: Diagnosis not present

## 2016-06-09 DIAGNOSIS — G4733 Obstructive sleep apnea (adult) (pediatric): Secondary | ICD-10-CM | POA: Diagnosis not present

## 2016-07-07 DIAGNOSIS — R0902 Hypoxemia: Secondary | ICD-10-CM | POA: Diagnosis not present

## 2016-07-07 DIAGNOSIS — J9610 Chronic respiratory failure, unspecified whether with hypoxia or hypercapnia: Secondary | ICD-10-CM | POA: Diagnosis not present

## 2016-07-07 DIAGNOSIS — J449 Chronic obstructive pulmonary disease, unspecified: Secondary | ICD-10-CM | POA: Diagnosis not present

## 2016-07-07 DIAGNOSIS — M7989 Other specified soft tissue disorders: Secondary | ICD-10-CM | POA: Diagnosis not present

## 2016-07-09 DIAGNOSIS — G4733 Obstructive sleep apnea (adult) (pediatric): Secondary | ICD-10-CM | POA: Diagnosis not present

## 2016-08-03 DIAGNOSIS — J449 Chronic obstructive pulmonary disease, unspecified: Secondary | ICD-10-CM | POA: Diagnosis not present

## 2016-08-03 DIAGNOSIS — G2581 Restless legs syndrome: Secondary | ICD-10-CM | POA: Diagnosis not present

## 2016-08-03 DIAGNOSIS — Z0001 Encounter for general adult medical examination with abnormal findings: Secondary | ICD-10-CM | POA: Diagnosis not present

## 2016-08-03 DIAGNOSIS — E1165 Type 2 diabetes mellitus with hyperglycemia: Secondary | ICD-10-CM | POA: Diagnosis not present

## 2016-08-03 DIAGNOSIS — I10 Essential (primary) hypertension: Secondary | ICD-10-CM | POA: Diagnosis not present

## 2016-08-03 DIAGNOSIS — Z9981 Dependence on supplemental oxygen: Secondary | ICD-10-CM | POA: Diagnosis not present

## 2016-08-07 DIAGNOSIS — J449 Chronic obstructive pulmonary disease, unspecified: Secondary | ICD-10-CM | POA: Diagnosis not present

## 2016-08-07 DIAGNOSIS — R0902 Hypoxemia: Secondary | ICD-10-CM | POA: Diagnosis not present

## 2016-08-07 DIAGNOSIS — M7989 Other specified soft tissue disorders: Secondary | ICD-10-CM | POA: Diagnosis not present

## 2016-08-07 DIAGNOSIS — J9610 Chronic respiratory failure, unspecified whether with hypoxia or hypercapnia: Secondary | ICD-10-CM | POA: Diagnosis not present

## 2016-08-09 DIAGNOSIS — G4733 Obstructive sleep apnea (adult) (pediatric): Secondary | ICD-10-CM | POA: Diagnosis not present

## 2016-09-06 DIAGNOSIS — J9610 Chronic respiratory failure, unspecified whether with hypoxia or hypercapnia: Secondary | ICD-10-CM | POA: Diagnosis not present

## 2016-09-06 DIAGNOSIS — J449 Chronic obstructive pulmonary disease, unspecified: Secondary | ICD-10-CM | POA: Diagnosis not present

## 2016-09-06 DIAGNOSIS — M7989 Other specified soft tissue disorders: Secondary | ICD-10-CM | POA: Diagnosis not present

## 2016-09-06 DIAGNOSIS — R0902 Hypoxemia: Secondary | ICD-10-CM | POA: Diagnosis not present

## 2016-09-08 DIAGNOSIS — G4733 Obstructive sleep apnea (adult) (pediatric): Secondary | ICD-10-CM | POA: Diagnosis not present

## 2016-09-10 DIAGNOSIS — G4733 Obstructive sleep apnea (adult) (pediatric): Secondary | ICD-10-CM | POA: Diagnosis not present

## 2016-10-07 DIAGNOSIS — R0902 Hypoxemia: Secondary | ICD-10-CM | POA: Diagnosis not present

## 2016-10-07 DIAGNOSIS — J9610 Chronic respiratory failure, unspecified whether with hypoxia or hypercapnia: Secondary | ICD-10-CM | POA: Diagnosis not present

## 2016-10-07 DIAGNOSIS — J449 Chronic obstructive pulmonary disease, unspecified: Secondary | ICD-10-CM | POA: Diagnosis not present

## 2016-10-07 DIAGNOSIS — M7989 Other specified soft tissue disorders: Secondary | ICD-10-CM | POA: Diagnosis not present

## 2016-10-09 DIAGNOSIS — G4733 Obstructive sleep apnea (adult) (pediatric): Secondary | ICD-10-CM | POA: Diagnosis not present

## 2016-11-05 DIAGNOSIS — Z9981 Dependence on supplemental oxygen: Secondary | ICD-10-CM | POA: Diagnosis not present

## 2016-11-05 DIAGNOSIS — J449 Chronic obstructive pulmonary disease, unspecified: Secondary | ICD-10-CM | POA: Diagnosis not present

## 2016-11-05 DIAGNOSIS — I10 Essential (primary) hypertension: Secondary | ICD-10-CM | POA: Diagnosis not present

## 2016-11-05 DIAGNOSIS — E119 Type 2 diabetes mellitus without complications: Secondary | ICD-10-CM | POA: Diagnosis not present

## 2016-11-07 DIAGNOSIS — J449 Chronic obstructive pulmonary disease, unspecified: Secondary | ICD-10-CM | POA: Diagnosis not present

## 2016-11-07 DIAGNOSIS — R0902 Hypoxemia: Secondary | ICD-10-CM | POA: Diagnosis not present

## 2016-11-07 DIAGNOSIS — M7989 Other specified soft tissue disorders: Secondary | ICD-10-CM | POA: Diagnosis not present

## 2016-11-07 DIAGNOSIS — J9610 Chronic respiratory failure, unspecified whether with hypoxia or hypercapnia: Secondary | ICD-10-CM | POA: Diagnosis not present

## 2016-11-09 DIAGNOSIS — G4733 Obstructive sleep apnea (adult) (pediatric): Secondary | ICD-10-CM | POA: Diagnosis not present

## 2016-11-25 DIAGNOSIS — R0600 Dyspnea, unspecified: Secondary | ICD-10-CM | POA: Diagnosis not present

## 2016-11-25 DIAGNOSIS — J449 Chronic obstructive pulmonary disease, unspecified: Secondary | ICD-10-CM | POA: Diagnosis not present

## 2016-12-07 DIAGNOSIS — R0902 Hypoxemia: Secondary | ICD-10-CM | POA: Diagnosis not present

## 2016-12-07 DIAGNOSIS — J449 Chronic obstructive pulmonary disease, unspecified: Secondary | ICD-10-CM | POA: Diagnosis not present

## 2016-12-07 DIAGNOSIS — J9610 Chronic respiratory failure, unspecified whether with hypoxia or hypercapnia: Secondary | ICD-10-CM | POA: Diagnosis not present

## 2016-12-07 DIAGNOSIS — M7989 Other specified soft tissue disorders: Secondary | ICD-10-CM | POA: Diagnosis not present

## 2016-12-09 DIAGNOSIS — G4733 Obstructive sleep apnea (adult) (pediatric): Secondary | ICD-10-CM | POA: Diagnosis not present

## 2016-12-14 DIAGNOSIS — G4733 Obstructive sleep apnea (adult) (pediatric): Secondary | ICD-10-CM | POA: Diagnosis not present

## 2017-01-07 DIAGNOSIS — R0902 Hypoxemia: Secondary | ICD-10-CM | POA: Diagnosis not present

## 2017-01-07 DIAGNOSIS — M7989 Other specified soft tissue disorders: Secondary | ICD-10-CM | POA: Diagnosis not present

## 2017-01-07 DIAGNOSIS — J449 Chronic obstructive pulmonary disease, unspecified: Secondary | ICD-10-CM | POA: Diagnosis not present

## 2017-01-07 DIAGNOSIS — J9610 Chronic respiratory failure, unspecified whether with hypoxia or hypercapnia: Secondary | ICD-10-CM | POA: Diagnosis not present

## 2017-01-09 DIAGNOSIS — G4733 Obstructive sleep apnea (adult) (pediatric): Secondary | ICD-10-CM | POA: Diagnosis not present

## 2017-02-03 ENCOUNTER — Ambulatory Visit (INDEPENDENT_AMBULATORY_CARE_PROVIDER_SITE_OTHER): Payer: Medicare Other | Admitting: Nurse Practitioner

## 2017-02-03 ENCOUNTER — Encounter: Payer: Self-pay | Admitting: Nurse Practitioner

## 2017-02-03 VITALS — BP 148/91 | HR 80 | Ht 60.0 in | Wt 194.0 lb

## 2017-02-03 DIAGNOSIS — E782 Mixed hyperlipidemia: Secondary | ICD-10-CM

## 2017-02-03 DIAGNOSIS — E039 Hypothyroidism, unspecified: Secondary | ICD-10-CM | POA: Diagnosis not present

## 2017-02-03 DIAGNOSIS — G894 Chronic pain syndrome: Secondary | ICD-10-CM

## 2017-02-03 DIAGNOSIS — E1165 Type 2 diabetes mellitus with hyperglycemia: Secondary | ICD-10-CM

## 2017-02-03 DIAGNOSIS — F411 Generalized anxiety disorder: Secondary | ICD-10-CM

## 2017-02-03 DIAGNOSIS — R300 Dysuria: Secondary | ICD-10-CM | POA: Insufficient documentation

## 2017-02-03 LAB — POCT GLYCOSYLATED HEMOGLOBIN (HGB A1C): Hemoglobin A1C: 6.3

## 2017-02-03 MED ORDER — HYDROCODONE-ACETAMINOPHEN 5-325 MG PO TABS: 1.0000 | ORAL_TABLET | Freq: Two times a day (BID) | ORAL | 0 refills | Status: DC

## 2017-02-03 MED ORDER — CLONAZEPAM 1 MG PO TABS: 1.0000 mg | ORAL_TABLET | Freq: Two times a day (BID) | ORAL | 2 refills | Status: DC | PRN

## 2017-02-03 NOTE — Progress Notes (Signed)
Subjective:     Patient ID: Lynn Robbins, female   DOB: 1940-09-23, 76 y.o.   MRN: [id number]  Skin lesion to right outer ear due to oxygen cannula rbbing on skin. Tried to use a band-aid to help, but this prevented her from being able to tighten the cannula. Tried to put pandaid on the ear to relieve pressure, but this made her cannula more uncomfortable.  Blood sugars doing well.  She suffers from chronic low back and hip pain. For this she does take hydrocodone/APAP 5/325mg  tablets, which improves her pain from 8/10 in severity to 4/10 in severity. This allows her to continue to participate in normal daily activities.    Current Outpatient Medications:  .  bisoprolol (ZEBETA) 5 MG tablet, Take 1 tablet by mouth daily., Disp: , Rfl: 2 .  glimepiride (AMARYL) 2 MG tablet, Take 2 mg by mouth daily with breakfast., Disp: , Rfl: 2 .  HYDROcodone-acetaminophen (NORCO/VICODIN) 5-325 MG tablet, Take 1 tablet by mouth 2 (two) times daily., Disp: 60 tablet, Rfl: 0 .  JANUMET XR 50-500 MG TB24, Take 1 tablet by mouth daily., Disp: , Rfl: 3 .  losartan-hydrochlorothiazide (HYZAAR) 50-12.5 MG tablet, Take 1 tablet by mouth daily., Disp: , Rfl: 0 .  naproxen (NAPROSYN) 500 MG tablet, Take 1 tablet by mouth 2 (two) times daily., Disp: , Rfl: 1 .  PROAIR HFA 108 (90 Base) MCG/ACT inhaler, Inhale 2 puffs into the lungs every 4 (four) hours as needed., Disp: , Rfl: 2 .  SPIRIVA HANDIHALER 18 MCG inhalation capsule, Take 1 capsule by mouth daily., Disp: , Rfl: 2 .  clonazePAM (KLONOPIN) 1 MG tablet, Take 1 tablet (1 mg total) by mouth 2 (two) times daily as needed for anxiety., Disp: 60 tablet, Rfl: 2   Review of Systems  Constitutional: Negative.   HENT: Negative.   Respiratory: Positive for shortness of breath.        Uses nasal cannula oxygen at all times   Cardiovascular: Negative for chest pain and palpitations.  Gastrointestinal: Negative.  Negative for abdominal pain, constipation and diarrhea.    Endocrine:       Blood sugars are doing well.   Genitourinary: Negative.   Musculoskeletal: Positive for arthralgias and back pain.  Skin:       Skin lesion along the upper right ear, she thinks due to he ocygen cannula rubbing against skin.   Allergic/Immunologic: Negative.   Neurological: Negative.   Hematological: Negative.   Psychiatric/Behavioral: The patient is nervous/anxious.    Today's Vitals   02/03/17 1143  BP: (!) 148/91  Pulse: 80  SpO2: 92%  Weight: 194 lb (88 kg)  Height: 5' (1.524 m)      Objective:   Physical Exam  Constitutional: She is oriented to person, place, and time. Vital signs are normal. She appears well-developed and well-nourished.  HENT:  Head: Normocephalic.  Right Ear: Right ear exhibits lacerations.  Ears:  Eyes: Pupils are equal, round, and reactive to light.  Neck: Normal range of motion. Neck supple.  Cardiovascular: Normal rate and regular rhythm.  Pulmonary/Chest: Effort normal and breath sounds normal. No respiratory distress. She has no wheezes.  She is using 2 liters ocygen via nasal cannula at all times.   Abdominal: Soft. There is no tenderness.  Musculoskeletal:  She has chronic lower back pain, which radiates into both hips. Pain is more severe when changing positions, especially when going from seated to standing position. There are no visible or palpable  abnormalities noted at this time. She uses a walker to help with ambulating.   Neurological: She is alert and oriented to person, place, and time.  Skin: Skin is warm and dry.  Psychiatric: Her speech is normal. Judgment and thought content normal. Her mood appears anxious. She is hyperactive. Cognition and memory are normal.  Nursing note and vitals reviewed.      Assessment:     Type 2 diabetes mellitus with hyperglycemia, without long-term current use of insulin (HCC) - Plan: POCT HgB A1C  Chronic pain disorder - Plan: HYDROcodone-acetaminophen (NORCO/VICODIN) 5-325 MG  tablet, DISCONTINUED: HYDROcodone-acetaminophen (NORCO/VICODIN) 5-325 MG tablet, DISCONTINUED: HYDROcodone-acetaminophen (NORCO/VICODIN) 5-325 MG tablet  Hypothyroidism, unspecified type  Generalized anxiety disorder - Plan: clonazePAM (KLONOPIN) 1 MG tablet  Mixed hyperlipidemia     Plan:     1. DM2 - HgbA1c 6.3 today. Continue all diabetic medications as prescribed.  2. Chronic pain - renew hydrocodone/APAP 5/325mg  tablets twice daily prn pain. Three 30 day prescriptions provided. Dates were 02/19/2017, 03/20/2017, and 04/17/2017 3. Hypothyroid - stable. Continue levothyroxine as prescribed  4. GAD - continue clonazepam as prescribed. New prescription sent to pharmacy.  5. hyperlipideima - continue cholesterol medication as prescribed   Follow up 3-4 months and sooner if needed

## 2017-02-06 DIAGNOSIS — J9610 Chronic respiratory failure, unspecified whether with hypoxia or hypercapnia: Secondary | ICD-10-CM | POA: Diagnosis not present

## 2017-02-06 DIAGNOSIS — R0902 Hypoxemia: Secondary | ICD-10-CM | POA: Diagnosis not present

## 2017-02-06 DIAGNOSIS — M7989 Other specified soft tissue disorders: Secondary | ICD-10-CM | POA: Diagnosis not present

## 2017-02-06 DIAGNOSIS — J449 Chronic obstructive pulmonary disease, unspecified: Secondary | ICD-10-CM | POA: Diagnosis not present

## 2017-02-08 DIAGNOSIS — G4733 Obstructive sleep apnea (adult) (pediatric): Secondary | ICD-10-CM | POA: Diagnosis not present

## 2017-02-23 ENCOUNTER — Other Ambulatory Visit: Payer: Self-pay

## 2017-02-23 MED ORDER — BISOPROLOL FUMARATE 5 MG PO TABS: 5.0000 mg | ORAL_TABLET | Freq: Every day | ORAL | 0 refills | Status: DC

## 2017-02-25 ENCOUNTER — Other Ambulatory Visit: Payer: Self-pay

## 2017-02-25 MED ORDER — ATENOLOL 25 MG PO TABS: 25.0000 mg | ORAL_TABLET | Freq: Every day | ORAL | 1 refills | Status: DC

## 2017-03-09 DIAGNOSIS — M7989 Other specified soft tissue disorders: Secondary | ICD-10-CM | POA: Diagnosis not present

## 2017-03-09 DIAGNOSIS — R0902 Hypoxemia: Secondary | ICD-10-CM | POA: Diagnosis not present

## 2017-03-09 DIAGNOSIS — J9610 Chronic respiratory failure, unspecified whether with hypoxia or hypercapnia: Secondary | ICD-10-CM | POA: Diagnosis not present

## 2017-03-09 DIAGNOSIS — J449 Chronic obstructive pulmonary disease, unspecified: Secondary | ICD-10-CM | POA: Diagnosis not present

## 2017-03-11 DIAGNOSIS — G4733 Obstructive sleep apnea (adult) (pediatric): Secondary | ICD-10-CM | POA: Diagnosis not present

## 2017-03-17 DIAGNOSIS — G4733 Obstructive sleep apnea (adult) (pediatric): Secondary | ICD-10-CM | POA: Diagnosis not present

## 2017-03-24 ENCOUNTER — Telehealth: Payer: Self-pay | Admitting: Internal Medicine

## 2017-03-24 ENCOUNTER — Other Ambulatory Visit: Payer: Self-pay

## 2017-03-24 NOTE — Telephone Encounter (Signed)
-----   Message from Waynard Edwards sent at 03/23/2017  4:33 PM EST ----- Regarding: controlled rx [facility] sent a request for this pt for clonazepam 1mg  tab sig: take 1 tab by mouth twice a day.

## 2017-03-25 ENCOUNTER — Other Ambulatory Visit: Payer: Self-pay | Admitting: Nurse Practitioner

## 2017-03-25 ENCOUNTER — Other Ambulatory Visit: Payer: Self-pay | Admitting: Internal Medicine

## 2017-03-25 DIAGNOSIS — F411 Generalized anxiety disorder: Secondary | ICD-10-CM

## 2017-03-25 MED ORDER — CLONAZEPAM 1 MG PO TABS: 1.0000 mg | ORAL_TABLET | Freq: Two times a day (BID) | ORAL | 2 refills | Status: DC | PRN

## 2017-03-25 NOTE — Telephone Encounter (Signed)
-----   Message from Edd Arbour, [place] sent at 03/25/2017 10:54 AM EST ----- Regarding: refill needed Refill on clonazepam needed

## 2017-04-09 DIAGNOSIS — M7989 Other specified soft tissue disorders: Secondary | ICD-10-CM | POA: Diagnosis not present

## 2017-04-09 DIAGNOSIS — J449 Chronic obstructive pulmonary disease, unspecified: Secondary | ICD-10-CM | POA: Diagnosis not present

## 2017-04-09 DIAGNOSIS — R0902 Hypoxemia: Secondary | ICD-10-CM | POA: Diagnosis not present

## 2017-04-09 DIAGNOSIS — J9610 Chronic respiratory failure, unspecified whether with hypoxia or hypercapnia: Secondary | ICD-10-CM | POA: Diagnosis not present

## 2017-04-11 DIAGNOSIS — G4733 Obstructive sleep apnea (adult) (pediatric): Secondary | ICD-10-CM | POA: Diagnosis not present

## 2017-05-05 ENCOUNTER — Ambulatory Visit: Payer: Self-pay | Admitting: Nurse Practitioner

## 2017-05-07 DIAGNOSIS — R0902 Hypoxemia: Secondary | ICD-10-CM | POA: Diagnosis not present

## 2017-05-07 DIAGNOSIS — J9610 Chronic respiratory failure, unspecified whether with hypoxia or hypercapnia: Secondary | ICD-10-CM | POA: Diagnosis not present

## 2017-05-07 DIAGNOSIS — J449 Chronic obstructive pulmonary disease, unspecified: Secondary | ICD-10-CM | POA: Diagnosis not present

## 2017-05-07 DIAGNOSIS — M7989 Other specified soft tissue disorders: Secondary | ICD-10-CM | POA: Diagnosis not present

## 2017-05-09 DIAGNOSIS — G4733 Obstructive sleep apnea (adult) (pediatric): Secondary | ICD-10-CM | POA: Diagnosis not present

## 2017-05-16 ENCOUNTER — Other Ambulatory Visit: Payer: Self-pay

## 2017-05-16 ENCOUNTER — Telehealth: Payer: Self-pay

## 2017-05-16 MED ORDER — ATENOLOL 25 MG PO TABS: 25.0000 mg | ORAL_TABLET | Freq: Every day | ORAL | 1 refills | Status: DC

## 2017-05-16 MED ORDER — LOSARTAN POTASSIUM-HCTZ 50-12.5 MG PO TABS
1.0000 | ORAL_TABLET | Freq: Every day | ORAL | 5 refills | Status: DC
Start: 2017-05-16 — End: 2017-09-20

## 2017-05-17 ENCOUNTER — Telehealth: Payer: Self-pay

## 2017-05-17 ENCOUNTER — Other Ambulatory Visit: Payer: Self-pay | Admitting: Nurse Practitioner

## 2017-05-17 DIAGNOSIS — G894 Chronic pain syndrome: Secondary | ICD-10-CM

## 2017-05-17 MED ORDER — HYDROCODONE-ACETAMINOPHEN 5-325 MG PO TABS: 1.0000 | ORAL_TABLET | Freq: Two times a day (BID) | ORAL | 0 refills | Status: DC

## 2017-05-17 NOTE — Telephone Encounter (Signed)
Pt informed of all medications sent to pharmacy.  dbs

## 2017-05-17 NOTE — Telephone Encounter (Signed)
Renewed hydrocodone/APAP 5/325mg  twice daily as needed.

## 2017-05-17 NOTE — Progress Notes (Signed)
Renewed hydrocodone/APAP 5/325mg  twice daily as needed.

## 2017-06-07 ENCOUNTER — Ambulatory Visit (INDEPENDENT_AMBULATORY_CARE_PROVIDER_SITE_OTHER): Payer: Medicare Other | Admitting: Nurse Practitioner

## 2017-06-07 ENCOUNTER — Encounter: Payer: Self-pay | Admitting: Nurse Practitioner

## 2017-06-07 VITALS — BP 148/80 | HR 78 | Resp 16 | Ht 61.0 in | Wt 184.0 lb

## 2017-06-07 DIAGNOSIS — J449 Chronic obstructive pulmonary disease, unspecified: Secondary | ICD-10-CM | POA: Diagnosis not present

## 2017-06-07 DIAGNOSIS — J9610 Chronic respiratory failure, unspecified whether with hypoxia or hypercapnia: Secondary | ICD-10-CM | POA: Diagnosis not present

## 2017-06-07 DIAGNOSIS — R0902 Hypoxemia: Secondary | ICD-10-CM | POA: Diagnosis not present

## 2017-06-07 DIAGNOSIS — E1165 Type 2 diabetes mellitus with hyperglycemia: Secondary | ICD-10-CM

## 2017-06-07 DIAGNOSIS — E039 Hypothyroidism, unspecified: Secondary | ICD-10-CM

## 2017-06-07 DIAGNOSIS — G894 Chronic pain syndrome: Secondary | ICD-10-CM

## 2017-06-07 DIAGNOSIS — J4520 Mild intermittent asthma, uncomplicated: Secondary | ICD-10-CM

## 2017-06-07 DIAGNOSIS — I10 Essential (primary) hypertension: Secondary | ICD-10-CM | POA: Insufficient documentation

## 2017-06-07 DIAGNOSIS — M7989 Other specified soft tissue disorders: Secondary | ICD-10-CM | POA: Diagnosis not present

## 2017-06-07 LAB — POCT GLYCOSYLATED HEMOGLOBIN (HGB A1C): HEMOGLOBIN A1C: 6.6

## 2017-06-07 MED ORDER — HYDROCODONE-ACETAMINOPHEN 5-325 MG PO TABS: 1.0000 | ORAL_TABLET | Freq: Two times a day (BID) | ORAL | 0 refills | Status: DC

## 2017-06-07 MED ORDER — PROAIR HFA 108 (90 BASE) MCG/ACT IN AERS: 2.0000 | INHALATION_SPRAY | RESPIRATORY_TRACT | 2 refills | Status: DC | PRN

## 2017-06-07 NOTE — Progress Notes (Signed)
[facility] [place], [place] [address]  Internal MEDICINE  Office Visit Note  Patient Name: Lynn Robbins  [id number]  [id number]  Date of Service: 06/07/2017  Chief Complaint  Patient presents with  . Diabetes  . Hypertension    The patient is here for routine follow up visit. She has well controlled diabetes. HgbA1c 6.6 today. Tolerating medications well. Using oxygen 2.5 liters per minute. Still has shortness of breath with exertion. Blood pressure well controlled, generally. She suffers from chronic low back and hip pain. For this she does take hydrocodone/APAP 5/325mg  tablets, which improves her pain from 8/10 in severity to 4/10 in severity. This allows her to continue to participate in normal daily activities.   Diabetes  Hypoglycemia symptoms include nervousness/anxiousness. Pertinent negatives for hypoglycemia include no headaches. Pertinent negatives for diabetes include no chest pain, no fatigue and no weakness.  Hypertension  Associated symptoms include shortness of breath. Pertinent negatives include no chest pain, headaches or palpitations.    Pt is here for routine follow up.    Current Medication: Outpatient Encounter Medications as of 06/07/2017  Medication Sig Note  . atenolol (TENORMIN) 25 MG tablet Take 1 tablet (25 mg total) by mouth daily.   . clonazePAM (KLONOPIN) 1 MG tablet Take 1 tablet (1 mg total) by mouth 2 (two) times daily as needed for anxiety.   Marland Kitchen glimepiride (AMARYL) 2 MG tablet Take 2 mg by mouth daily with breakfast. 01/05/2016: Received from: External Pharmacy Received Sig: TAKE 1 TABLET BY MOUTH EVERY MORNING WITH BREAKFAST  . HYDROcodone-acetaminophen (NORCO/VICODIN) 5-325 MG tablet Take 1 tablet by mouth 2 (two) times daily.   Marland Kitchen JANUMET XR 50-500 MG TB24 Take 1 tablet by mouth daily. 01/05/2016: Received from: External Pharmacy Received Sig: TAKE ONE TAB ADAY WITH FOOD FOR DIABETES  . losartan-hydrochlorothiazide  (HYZAAR) 50-12.5 MG tablet Take 1 tablet by mouth daily.   . naproxen (NAPROSYN) 500 MG tablet Take 1 tablet by mouth 2 (two) times daily. 01/05/2016: Received from: External Pharmacy Received Sig: TAKE 1 TABLET(S) BY MOUTH TWICE A DAY AS NEEDED FOR PAIN AND INFMALLATION  . PROAIR HFA 108 (90 Base) MCG/ACT inhaler Inhale 2 puffs into the lungs every 4 (four) hours as needed for wheezing.   Marland Kitchen SPIRIVA HANDIHALER 18 MCG inhalation capsule Take 1 capsule by mouth daily. 01/05/2016: Received from: External Pharmacy Received Sig: TAKE 1 CAPSULE EVERY DAY  . [DISCONTINUED] HYDROcodone-acetaminophen (NORCO/VICODIN) 5-325 MG tablet Take 1 tablet by mouth 2 (two) times daily.   . [DISCONTINUED] HYDROcodone-acetaminophen (NORCO/VICODIN) 5-325 MG tablet Take 1 tablet by mouth 2 (two) times daily.   . [DISCONTINUED] HYDROcodone-acetaminophen (NORCO/VICODIN) 5-325 MG tablet Take 1 tablet by mouth 2 (two) times daily.   . [DISCONTINUED] PROAIR HFA 108 (90 Base) MCG/ACT inhaler Inhale 2 puffs into the lungs every 4 (four) hours as needed. 01/05/2016: Received from: External Pharmacy Received Sig: INHALE TWO PUFFS EVERY 4 HOURS AS NEEDED   No facility-administered encounter medications on file as of 06/07/2017.     Surgical History: Past Surgical History:  Procedure Laterality Date  . CESAREAN SECTION      Medical History: Past Medical History:  Diagnosis Date  . CHF (congestive heart failure) ([place])   . COPD (chronic obstructive pulmonary disease) ([place])   . Depression   . Diabetes mellitus without complication ([place])   . Hypertension     Family History: Family History  Problem Relation Age of Onset  . Diabetes Father  Social History   Socioeconomic History  . Marital status: Single    Spouse name: Not on file  . Number of children: Not on file  . Years of education: Not on file  . Highest education level: Not on file  Occupational History  . Not on file  Social Needs  . Financial  resource strain: Not on file  . Food insecurity:    Worry: Not on file    Inability: Not on file  . Transportation needs:    Medical: Not on file    Non-medical: Not on file  Tobacco Use  . Smoking status: Former Research scientist (life sciences)  . Smokeless tobacco: Never Used  Substance and Sexual Activity  . Alcohol use: No  . Drug use: No  . Sexual activity: Never  Lifestyle  . Physical activity:    Days per week: Not on file    Minutes per session: Not on file  . Stress: Not on file  Relationships  . Social connections:    Talks on phone: Not on file    Gets together: Not on file    Attends religious service: Not on file    Active member of club or organization: Not on file    Attends meetings of clubs or organizations: Not on file    Relationship status: Not on file  . Intimate partner violence:    Fear of current or ex partner: Not on file    Emotionally abused: Not on file    Physically abused: Not on file    Forced sexual activity: Not on file  Other Topics Concern  . Not on file  Social History Narrative   ** Merged History Encounter **          Review of Systems  Constitutional: Negative for activity change, chills, diaphoresis, fatigue and unexpected weight change.  HENT: Negative for congestion, postnasal drip, rhinorrhea and voice change.   Eyes: Negative.   Respiratory: Positive for shortness of breath.        Uses nasal cannula oxygen at all times   Cardiovascular: Negative for chest pain and palpitations.  Gastrointestinal: Negative for abdominal pain, constipation, diarrhea and vomiting.  Endocrine:       Blood sugars are doing well.   Genitourinary: Negative for difficulty urinating, dysuria and frequency.  Musculoskeletal: Positive for arthralgias, back pain and myalgias.  Skin: Negative for rash.  Allergic/Immunologic: Negative for environmental allergies and food allergies.  Neurological: Negative for weakness and headaches.  Hematological: Negative.  Negative for  adenopathy.  Psychiatric/Behavioral: The patient is nervous/anxious.     Today's Vitals   06/07/17 0948  BP: (!) 148/80  Pulse: 78  Resp: 16  SpO2: 96%  Weight: 184 lb (83.5 kg)  Height: 5\' 1"  (1.549 m)    Physical Exam  Constitutional: She is oriented to person, place, and time. Vital signs are normal. She appears well-developed and well-nourished.  HENT:  Head: Normocephalic and atraumatic.  Right Ear: Right ear exhibits lacerations.  Ears:  Eyes: Pupils are equal, round, and reactive to light. EOM are normal.  Neck: Normal range of motion. Neck supple. Carotid bruit is not present.  Cardiovascular: Normal rate, regular rhythm and normal heart sounds.  Pulmonary/Chest: Effort normal and breath sounds normal. No respiratory distress. She has no wheezes.  She is using 2 liters ocygen via nasal cannula at all times.   Abdominal: Soft. There is no tenderness.  Musculoskeletal:  She has chronic lower back pain, which radiates into both hips. Pain  is more severe when changing positions, especially when going from seated to standing position. There are no visible or palpable abnormalities noted at this time. She uses a walker to help with ambulating.   Neurological: She is alert and oriented to person, place, and time.  Skin: Skin is warm and dry.  Psychiatric: Her speech is normal. Judgment and thought content normal. Her mood appears anxious. She is hyperactive. Cognition and memory are normal.  Nursing note and vitals reviewed.  Assessment/Plan: 1. Type 2 diabetes mellitus with hyperglycemia, without long-term current use of insulin (HCC) - POCT HgB A1C 6.6 today. Continue diabetic medications as prescribed.   2. Intermittent asthma without complication, unspecified asthma severity - PROAIR HFA 108 (90 Base) MCG/ACT inhaler; Inhale 2 puffs into the lungs every 4 (four) hours as needed for wheezing.  Dispense: 1 Inhaler; Refill: 2  3. Chronic pain disorder Ok to take  hydrocodone/APAP 5/325mg  twice daily if needed for moderate to severe pain. Three 30 day prescriptions sent to pharmacy. Dates are 06/07/2017, 07/05/2017, and 08/03/2017. - HYDROcodone-acetaminophen (NORCO/VICODIN) 5-325 MG tablet; Take 1 tablet by mouth 2 (two) times daily.  Dispense: 60 tablet; Refill: 0  4. Essential hypertension Stable. Continue bp medication as prescribed.   5. Hypothyroidism, unspecified type Check thyroid panel at next visit. Adjust levothyroxine as indicated  General Counseling: aliea bobe understanding of the findings of todays visit and agrees with plan of treatment. I have discussed any further diagnostic evaluation that may be needed or ordered today. We also reviewed her medications today. she has been encouraged to call the office with any questions or concerns that should arise related to todays visit.   Reviewed risks and possible side effects associated with taking opiates and benzodiazepines. Combination of these could cause dizziness and drowsiness. Advised him not to drive or operate machinery when taking these medications, as he could put his life and the lives of others at risk. He voiced understanding.   This patient was seen by Leretha Pol, FNP- C in Collaboration with Dr [name] as a part of collaborative care agreement    Orders Placed This Encounter  Procedures  . POCT HgB A1C    Meds ordered this encounter  Medications  . PROAIR HFA 108 (90 Base) MCG/ACT inhaler    Sig: Inhale 2 puffs into the lungs every 4 (four) hours as needed for wheezing.    Dispense:  1 Inhaler    Refill:  2    Order Specific Question:   Supervising Provider    Answer:   [name] [[id number]]  . DISCONTD: HYDROcodone-acetaminophen (NORCO/VICODIN) 5-325 MG tablet    Sig: Take 1 tablet by mouth 2 (two) times daily.    Dispense:  60 tablet    Refill:  0    Order Specific Question:   Supervising Provider    Answer:   [name] [[id number]]  . DISCONTD:  HYDROcodone-acetaminophen (NORCO/VICODIN) 5-325 MG tablet    Sig: Take 1 tablet by mouth 2 (two) times daily.    Dispense:  60 tablet    Refill:  0    Fill after 07/05/2017    Order Specific Question:   Supervising Provider    Answer:   [name] [[id number]]  . HYDROcodone-acetaminophen (NORCO/VICODIN) 5-325 MG tablet    Sig: Take 1 tablet by mouth 2 (two) times daily.    Dispense:  60 tablet    Refill:  0    Fill after 08/03/2017  Order Specific Question:   Supervising Provider    Answer:   [name] [[id number]]    Time spent: 41 Minutes          Dr [name] Internal medicine

## 2017-06-09 DIAGNOSIS — G4733 Obstructive sleep apnea (adult) (pediatric): Secondary | ICD-10-CM | POA: Diagnosis not present

## 2017-07-04 ENCOUNTER — Other Ambulatory Visit: Payer: Self-pay

## 2017-07-04 DIAGNOSIS — F411 Generalized anxiety disorder: Secondary | ICD-10-CM

## 2017-07-04 IMAGING — DX DG HUMERUS LEFT
2 series · 2 of 2 positions shown · non-contrast
Comparison: None.

CLINICAL DATA: Left arm pain for 2 weeks, no known injury, initial
encounter

EXAM:
LEFT HUMERUS - 2+ VIEW

[humerus lat]
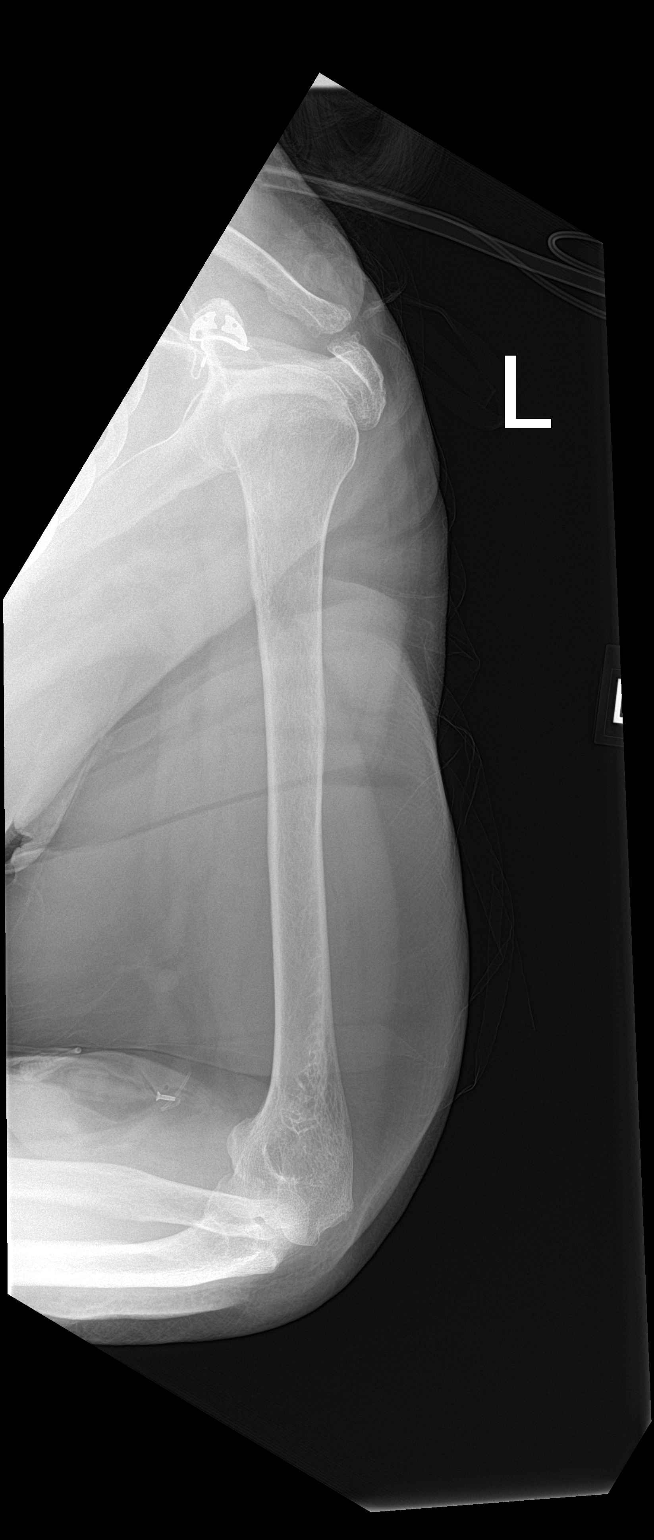

[humerus ap]
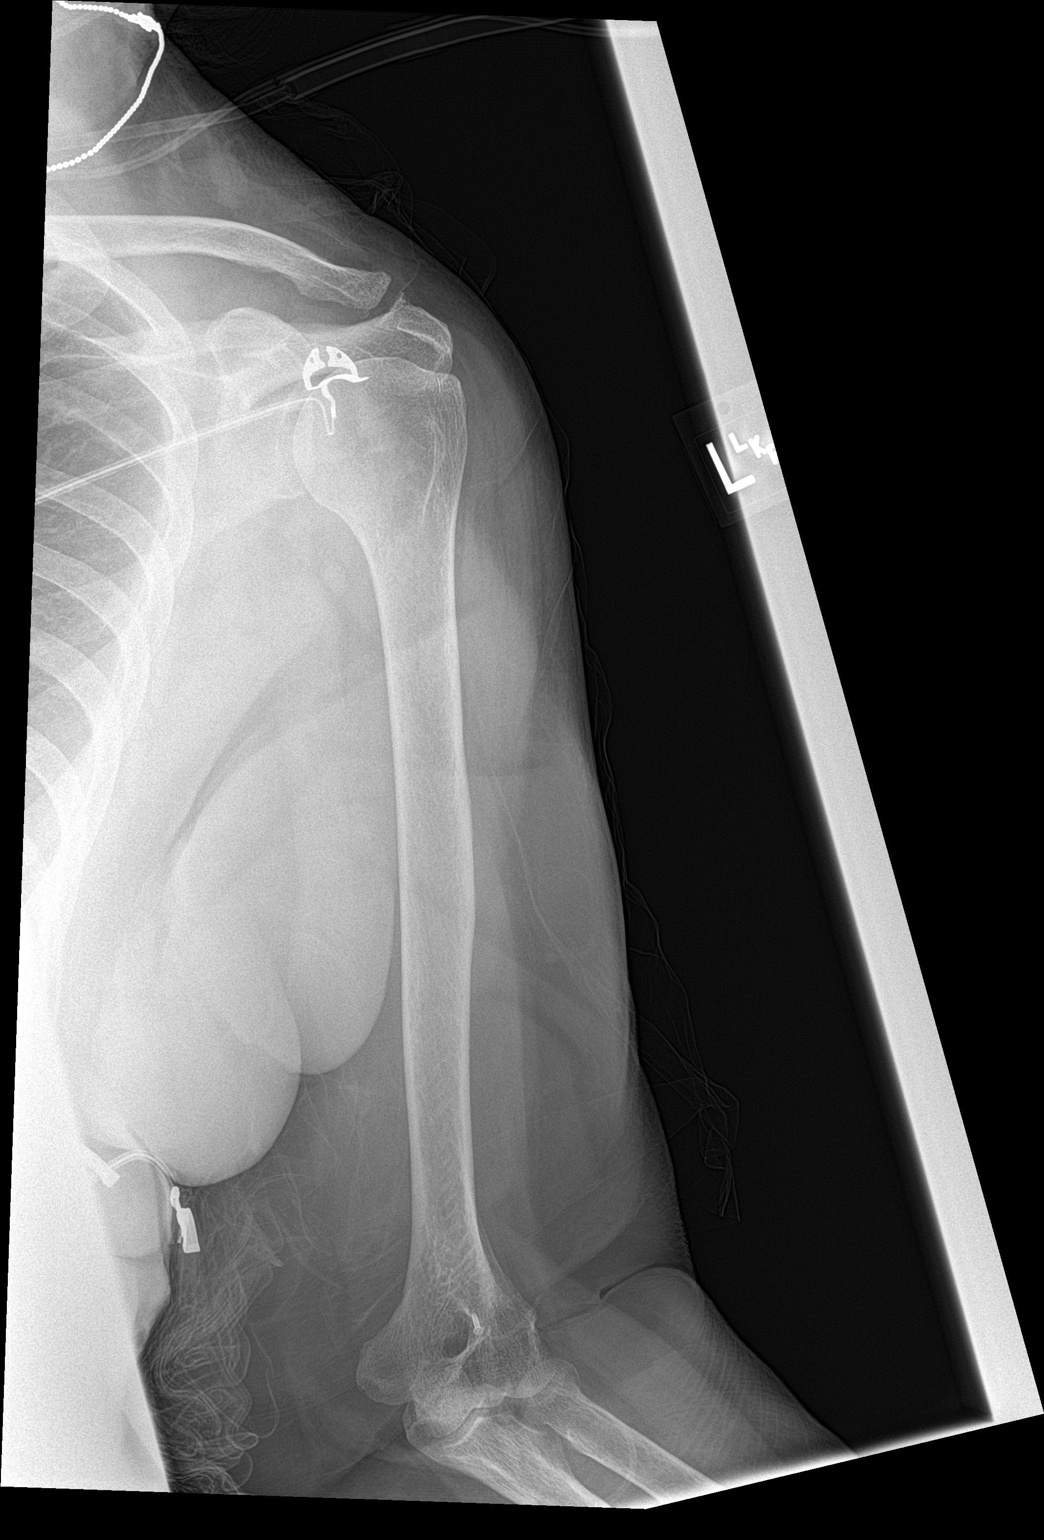

[2 of 2 positions shown; findings below may reference images not displayed]

FINDINGS: Mild degenerative changes of the acromioclavicular joint are seen.
No acute fracture or dislocation is noted. No gross soft tissue
abnormality is noted.
IMPRESSION: No acute abnormality seen.

## 2017-07-04 MED ORDER — CLONAZEPAM 1 MG PO TABS: 1.0000 mg | ORAL_TABLET | Freq: Two times a day (BID) | ORAL | 3 refills | Status: DC | PRN

## 2017-07-04 NOTE — Telephone Encounter (Signed)
Pt called need a refills for clonazepam as per heather called in clonazepam 1 mg twice a day 60 with 3 refills

## 2017-07-07 DIAGNOSIS — J449 Chronic obstructive pulmonary disease, unspecified: Secondary | ICD-10-CM | POA: Diagnosis not present

## 2017-07-07 DIAGNOSIS — J9610 Chronic respiratory failure, unspecified whether with hypoxia or hypercapnia: Secondary | ICD-10-CM | POA: Diagnosis not present

## 2017-07-07 DIAGNOSIS — R0902 Hypoxemia: Secondary | ICD-10-CM | POA: Diagnosis not present

## 2017-07-07 DIAGNOSIS — M7989 Other specified soft tissue disorders: Secondary | ICD-10-CM | POA: Diagnosis not present

## 2017-07-09 DIAGNOSIS — G4733 Obstructive sleep apnea (adult) (pediatric): Secondary | ICD-10-CM | POA: Diagnosis not present

## 2017-07-10 ENCOUNTER — Other Ambulatory Visit: Payer: Self-pay | Admitting: Internal Medicine

## 2017-08-07 DIAGNOSIS — J9610 Chronic respiratory failure, unspecified whether with hypoxia or hypercapnia: Secondary | ICD-10-CM | POA: Diagnosis not present

## 2017-08-07 DIAGNOSIS — M7989 Other specified soft tissue disorders: Secondary | ICD-10-CM | POA: Diagnosis not present

## 2017-08-07 DIAGNOSIS — J449 Chronic obstructive pulmonary disease, unspecified: Secondary | ICD-10-CM | POA: Diagnosis not present

## 2017-08-07 DIAGNOSIS — R0902 Hypoxemia: Secondary | ICD-10-CM | POA: Diagnosis not present

## 2017-08-09 DIAGNOSIS — G4733 Obstructive sleep apnea (adult) (pediatric): Secondary | ICD-10-CM | POA: Diagnosis not present

## 2017-09-06 DIAGNOSIS — J9610 Chronic respiratory failure, unspecified whether with hypoxia or hypercapnia: Secondary | ICD-10-CM | POA: Diagnosis not present

## 2017-09-06 DIAGNOSIS — J449 Chronic obstructive pulmonary disease, unspecified: Secondary | ICD-10-CM | POA: Diagnosis not present

## 2017-09-06 DIAGNOSIS — M7989 Other specified soft tissue disorders: Secondary | ICD-10-CM | POA: Diagnosis not present

## 2017-09-06 DIAGNOSIS — R0902 Hypoxemia: Secondary | ICD-10-CM | POA: Diagnosis not present

## 2017-09-08 ENCOUNTER — Ambulatory Visit: Payer: Self-pay | Admitting: Nurse Practitioner

## 2017-09-08 DIAGNOSIS — G4733 Obstructive sleep apnea (adult) (pediatric): Secondary | ICD-10-CM | POA: Diagnosis not present

## 2017-09-19 ENCOUNTER — Other Ambulatory Visit: Payer: Self-pay

## 2017-09-19 MED ORDER — SITAGLIP PHOS-METFORMIN HCL ER 50-500 MG PO TB24: ORAL_TABLET | ORAL | 5 refills | Status: DC

## 2017-09-20 ENCOUNTER — Encounter: Payer: Self-pay | Admitting: Adult Health

## 2017-09-20 ENCOUNTER — Ambulatory Visit (INDEPENDENT_AMBULATORY_CARE_PROVIDER_SITE_OTHER): Payer: Medicare Other | Admitting: Adult Health

## 2017-09-20 VITALS — BP 102/66 | HR 78 | Resp 16 | Ht 60.0 in | Wt 188.4 lb

## 2017-09-20 DIAGNOSIS — J449 Chronic obstructive pulmonary disease, unspecified: Secondary | ICD-10-CM

## 2017-09-20 DIAGNOSIS — F411 Generalized anxiety disorder: Secondary | ICD-10-CM

## 2017-09-20 DIAGNOSIS — E1165 Type 2 diabetes mellitus with hyperglycemia: Secondary | ICD-10-CM | POA: Diagnosis not present

## 2017-09-20 DIAGNOSIS — I10 Essential (primary) hypertension: Secondary | ICD-10-CM | POA: Diagnosis not present

## 2017-09-20 DIAGNOSIS — G894 Chronic pain syndrome: Secondary | ICD-10-CM | POA: Diagnosis not present

## 2017-09-20 LAB — POCT GLYCOSYLATED HEMOGLOBIN (HGB A1C): HEMOGLOBIN A1C: 6.5 % — AB (ref 4.0–5.6)

## 2017-09-20 MED ORDER — CLONAZEPAM 1 MG PO TABS
1.0000 mg | ORAL_TABLET | Freq: Two times a day (BID) | ORAL | 2 refills | Status: DC | PRN
Start: 2017-09-20 — End: 2017-10-17

## 2017-09-20 MED ORDER — ATENOLOL 25 MG PO TABS: 25.0000 mg | ORAL_TABLET | Freq: Every day | ORAL | 1 refills | Status: DC

## 2017-09-20 MED ORDER — LOSARTAN POTASSIUM-HCTZ 50-12.5 MG PO TABS: 1.0000 | ORAL_TABLET | Freq: Every day | ORAL | 5 refills | Status: DC

## 2017-09-20 MED ORDER — HYDROCODONE-ACETAMINOPHEN 5-325 MG PO TABS: 1.0000 | ORAL_TABLET | Freq: Two times a day (BID) | ORAL | 0 refills | Status: DC

## 2017-09-20 MED ORDER — GLIMEPIRIDE 2 MG PO TABS: 2.0000 mg | ORAL_TABLET | Freq: Every day | ORAL | 2 refills | Status: DC

## 2017-09-20 NOTE — Patient Instructions (Signed)
Diabetes Mellitus and Nutrition When you have diabetes (diabetes mellitus), it is very important to have healthy eating habits because your blood sugar (glucose) levels are greatly affected by what you eat and drink. Eating healthy foods in the appropriate amounts, at about the same times every day, can help you:  Control your blood glucose.  Lower your risk of heart disease.  Improve your blood pressure.  Reach or maintain a healthy weight.  Every person with diabetes is different, and each person has different needs for a meal plan. Your health care provider may recommend that you work with a diet and nutrition specialist (dietitian) to make a meal plan that is best for you. Your meal plan may vary depending on factors such as:  The calories you need.  The medicines you take.  Your weight.  Your blood glucose, blood pressure, and cholesterol levels.  Your activity level.  Other health conditions you have, such as heart or kidney disease.  How do carbohydrates affect me? Carbohydrates affect your blood glucose level more than any other type of food. Eating carbohydrates naturally increases the amount of glucose in your blood. Carbohydrate counting is a method for keeping track of how many carbohydrates you eat. Counting carbohydrates is important to keep your blood glucose at a healthy level, especially if you use insulin or take certain oral diabetes medicines. It is important to know how many carbohydrates you can safely have in each meal. This is different for every person. Your dietitian can help you calculate how many carbohydrates you should have at each meal and for snack. Foods that contain carbohydrates include:  Bread, cereal, rice, pasta, and crackers.  Potatoes and corn.  Peas, beans, and lentils.  Milk and yogurt.  Fruit and juice.  Desserts, such as cakes, cookies, ice cream, and candy.  How does alcohol affect me? Alcohol can cause a sudden decrease in blood  glucose (hypoglycemia), especially if you use insulin or take certain oral diabetes medicines. Hypoglycemia can be a life-threatening condition. Symptoms of hypoglycemia (sleepiness, dizziness, and confusion) are similar to symptoms of having too much alcohol. If your health care provider says that alcohol is safe for you, follow these guidelines:  Limit alcohol intake to no more than 1 drink per day for nonpregnant women and 2 drinks per day for men. One drink equals 12 oz of beer, 5 oz of wine, or 1 oz of hard liquor.  Do not drink on an empty stomach.  Keep yourself hydrated with water, diet soda, or unsweetened iced tea.  Keep in mind that regular soda, juice, and other mixers may contain a lot of sugar and must be counted as carbohydrates.  What are tips for following this plan? Reading food labels  Start by checking the serving size on the label. The amount of calories, carbohydrates, fats, and other nutrients listed on the label are based on one serving of the food. Many foods contain more than one serving per package.  Check the total grams (g) of carbohydrates in one serving. You can calculate the number of servings of carbohydrates in one serving by dividing the total carbohydrates by 15. For example, if a food has 30 g of total carbohydrates, it would be equal to 2 servings of carbohydrates.  Check the number of grams (g) of saturated and trans fats in one serving. Choose foods that have low or no amount of these fats.  Check the number of milligrams (mg) of sodium in one serving. Most people   should limit total sodium intake to less than 2,300 mg per day.  Always check the nutrition information of foods labeled as "low-fat" or "nonfat". These foods may be higher in added sugar or refined carbohydrates and should be avoided.  Talk to your dietitian to identify your daily goals for nutrients listed on the label. Shopping  Avoid buying canned, premade, or processed foods. These  foods tend to be high in fat, sodium, and added sugar.  Shop around the outside edge of the grocery store. This includes fresh fruits and vegetables, bulk grains, fresh meats, and fresh dairy. Cooking  Use low-heat cooking methods, such as baking, instead of high-heat cooking methods like deep frying.  Cook using healthy oils, such as olive, canola, or sunflower oil.  Avoid cooking with butter, cream, or high-fat meats. Meal planning  Eat meals and snacks regularly, preferably at the same times every day. Avoid going long periods of time without eating.  Eat foods high in fiber, such as fresh fruits, vegetables, beans, and whole grains. Talk to your dietitian about how many servings of carbohydrates you can eat at each meal.  Eat 4-6 ounces of lean protein each day, such as lean meat, chicken, fish, eggs, or tofu. 1 ounce is equal to 1 ounce of meat, chicken, or fish, 1 egg, or 1/4 cup of tofu.  Eat some foods each day that contain healthy fats, such as avocado, nuts, seeds, and fish. Lifestyle   Check your blood glucose regularly.  Exercise at least 30 minutes 5 or more days each week, or as told by your health care provider.  Take medicines as told by your health care provider.  Do not use any products that contain nicotine or tobacco, such as cigarettes and e-cigarettes. If you need help quitting, ask your health care provider.  Work with a counselor or diabetes educator to identify strategies to manage stress and any emotional and social challenges. What are some questions to ask my health care provider?  Do I need to meet with a diabetes educator?  Do I need to meet with a dietitian?  What number can I call if I have questions?  When are the best times to check my blood glucose? Where to find more information:  American Diabetes Association: diabetes.org/food-and-fitness/food  Academy of Nutrition and Dietetics:  www.eatright.org/resources/health/diseases-and-conditions/diabetes  National Institute of Diabetes and Digestive and Kidney Diseases (NIH): www.niddk.nih.gov/health-information/diabetes/overview/diet-eating-physical-activity Summary  A healthy meal plan will help you control your blood glucose and maintain a healthy lifestyle.  Working with a diet and nutrition specialist (dietitian) can help you make a meal plan that is best for you.  Keep in mind that carbohydrates and alcohol have immediate effects on your blood glucose levels. It is important to count carbohydrates and to use alcohol carefully. This information is not intended to replace advice given to you by your health care provider. Make sure you discuss any questions you have with your health care provider. Document Released: 10/29/2004 Document Revised: 03/08/2016 Document Reviewed: 03/08/2016 Elsevier Interactive Patient Education  2018 [employer]  

## 2017-09-20 NOTE — Progress Notes (Signed)
[facility] [place], [place] [address]  Internal MEDICINE  Office Visit Note  Patient Name: Lynn Robbins  [id number]  [id number]  Date of Service: 09/20/2017  Chief Complaint  Patient presents with  . Diabetes  . Hypertension  . Depression  . COPD    HPI Pt here for follow up on DM, HTN and copd.  She needs medication refills.  She denies need at this time.  She apparently ran out of her hydrocodone on July 30th.  Her diabetes is well controlled.  She is able to take her medications without issues.  She has HTN that is generally controlled with medications, and chronic low back paint that she takes hydrocodone for.     Current Medication: Outpatient Encounter Medications as of 09/20/2017  Medication Sig Note  . atenolol (TENORMIN) 25 MG tablet Take 1 tablet (25 mg total) by mouth daily.   Marland Kitchen glimepiride (AMARYL) 2 MG tablet Take 2 mg by mouth daily with breakfast. 01/05/2016: Received from: External Pharmacy Received Sig: TAKE 1 TABLET BY MOUTH EVERY MORNING WITH BREAKFAST  . HYDROcodone-acetaminophen (NORCO/VICODIN) 5-325 MG tablet Take 1 tablet by mouth 2 (two) times daily.   Marland Kitchen losartan-hydrochlorothiazide (HYZAAR) 50-12.5 MG tablet Take 1 tablet by mouth daily.   . naproxen (NAPROSYN) 500 MG tablet Take 1 tablet by mouth 2 (two) times daily. 01/05/2016: Received from: External Pharmacy Received Sig: TAKE 1 TABLET(S) BY MOUTH TWICE A DAY AS NEEDED FOR PAIN AND INFMALLATION  . OXYGEN Inhale into the lungs.   Marland Kitchen PROAIR HFA 108 (90 Base) MCG/ACT inhaler Inhale 2 puffs into the lungs every 4 (four) hours as needed for wheezing.   . SitaGLIPtin-MetFORMIN HCl (JANUMET XR) 50-500 MG TB24 TAKE ONE TAB A DAY WITH FOOD FOR DIABETES   . SPIRIVA HANDIHALER 18 MCG inhalation capsule Take 1 capsule by mouth daily. 01/05/2016: Received from: External Pharmacy Received Sig: TAKE 1 CAPSULE EVERY DAY  . clonazePAM (KLONOPIN) 1 MG tablet Take 1 tablet (1 mg total) by mouth 2  (two) times daily as needed for anxiety.    No facility-administered encounter medications on file as of 09/20/2017.     Surgical History: Past Surgical History:  Procedure Laterality Date  . CESAREAN SECTION      Medical History: Past Medical History:  Diagnosis Date  . CHF (congestive heart failure) ([place])   . COPD (chronic obstructive pulmonary disease) ([place])   . Depression   . Diabetes mellitus without complication ([place])   . Hypertension     Family History: Family History  Problem Relation Age of Onset  . Diabetes Father     Social History   Socioeconomic History  . Marital status: Single    Spouse name: Not on file  . Number of children: Not on file  . Years of education: Not on file  . Highest education level: Not on file  Occupational History  . Not on file  Social Needs  . Financial resource strain: Not on file  . Food insecurity:    Worry: Not on file    Inability: Not on file  . Transportation needs:    Medical: Not on file    Non-medical: Not on file  Tobacco Use  . Smoking status: Former Research scientist (life sciences)  . Smokeless tobacco: Never Used  Substance and Sexual Activity  . Alcohol use: No  . Drug use: No  . Sexual activity: Never  Lifestyle  . Physical activity:    Days per week: Not on  file    Minutes per session: Not on file  . Stress: Not on file  Relationships  . Social connections:    Talks on phone: Not on file    Gets together: Not on file    Attends religious service: Not on file    Active member of club or organization: Not on file    Attends meetings of clubs or organizations: Not on file    Relationship status: Not on file  . Intimate partner violence:    Fear of current or ex partner: Not on file    Emotionally abused: Not on file    Physically abused: Not on file    Forced sexual activity: Not on file  Other Topics Concern  . Not on file  Social History Narrative   ** Merged History Encounter **          Review of Systems   Constitutional: Negative for chills, fatigue and unexpected weight change.  HENT: Negative for congestion, rhinorrhea, sneezing and sore throat.   Eyes: Negative for photophobia, pain and redness.  Respiratory: Negative for cough, chest tightness and shortness of breath.   Cardiovascular: Negative for chest pain and palpitations.  Gastrointestinal: Negative for abdominal pain, constipation, diarrhea, nausea and vomiting.  Endocrine: Negative.   Genitourinary: Negative for dysuria and frequency.  Musculoskeletal: Negative for arthralgias, back pain, joint swelling and neck pain.  Skin: Negative for rash.  Allergic/Immunologic: Negative.   Neurological: Negative for tremors and numbness.  Hematological: Negative for adenopathy. Does not bruise/bleed easily.  Psychiatric/Behavioral: Negative for behavioral problems and sleep disturbance. The patient is not nervous/anxious.     Vital Signs: BP 102/66   Pulse 78   Resp 16   Ht 5' (1.524 m)   Wt 188 lb 6.4 oz (85.5 kg)   SpO2 97%   BMI 36.79 kg/m    Physical Exam  Constitutional: She is oriented to person, place, and time. She appears well-developed and well-nourished. No distress.  HENT:  Head: Normocephalic and atraumatic.  Mouth/Throat: Oropharynx is clear and moist. No oropharyngeal exudate.  Eyes: Pupils are equal, round, and reactive to light. EOM are normal.  Neck: Normal range of motion. Neck supple. No JVD present. No tracheal deviation present. No thyromegaly present.  Cardiovascular: Normal rate, regular rhythm and normal heart sounds. Exam reveals no gallop and no friction rub.  No murmur heard. Pulmonary/Chest: Effort normal and breath sounds normal. No respiratory distress. She has no wheezes. She has no rales. She exhibits no tenderness.  Abdominal: Soft. There is no tenderness. There is no guarding.  Musculoskeletal: Normal range of motion.  Lymphadenopathy:    She has no cervical adenopathy.  Neurological: She is  alert and oriented to person, place, and time. No cranial nerve deficit.  Skin: Skin is warm and dry. She is not diaphoretic.  Psychiatric: She has a normal mood and affect. Her behavior is normal. Judgment and thought content normal.  Nursing note and vitals reviewed.   Assessment/Plan: 1. Type 2 diabetes mellitus with hyperglycemia, without long-term current use of insulin (HCC) A1C stable at this time. Continue medications as prescribed.  - glimepiride (AMARYL) 2 MG tablet; Take 1 tablet (2 mg total) by mouth daily with breakfast.  Dispense: 30 tablet; Refill: 2  2. Chronic obstructive pulmonary disease, unspecified COPD type ([place]) Pt on continuous oxygen.  - OXYGEN; Inhale into the lungs.  3. Chronic pain disorder - HYDROcodone-acetaminophen (NORCO/VICODIN) 5-325 MG tablet; Take 1 tablet by mouth 2 (two) times  daily.  Dispense: 60 tablet; Refill: 0  4. Uncontrolled type 2 diabetes mellitus with hyperglycemia (HCC) - POCT HgB A1C  5. Essential hypertension Stable at this time.  Continue medications.  - atenolol (TENORMIN) 25 MG tablet; Take 1 tablet (25 mg total) by mouth daily.  Dispense: 90 tablet; Refill: 1 - losartan-hydrochlorothiazide (HYZAAR) 50-12.5 MG tablet; Take 1 tablet by mouth daily.  Dispense: 30 tablet; Refill: 5  General Counseling: joselyne spake understanding of the findings of todays visit and agrees with plan of treatment. I have discussed any further diagnostic evaluation that may be needed or ordered today. We also reviewed her medications today. she has been encouraged to call the office with any questions or concerns that should arise related to todays visit.    Orders Placed This Encounter  Procedures  . POCT HgB A1C    No orders of the defined types were placed in this encounter.   Time spent: 25 Minutes   This patient was seen by Orson Gear AGNP-C in Collaboration with Dr [name] as a part of collaborative care agreement    Dr [name] Internal medicine

## 2017-09-22 ENCOUNTER — Encounter: Payer: Self-pay | Admitting: Adult Health

## 2017-09-30 ENCOUNTER — Other Ambulatory Visit: Payer: Self-pay | Admitting: Adult Health

## 2017-09-30 DIAGNOSIS — I10 Essential (primary) hypertension: Secondary | ICD-10-CM

## 2017-09-30 MED ORDER — SITAGLIP PHOS-METFORMIN HCL ER 50-500 MG PO TB24: ORAL_TABLET | ORAL | 2 refills | Status: DC

## 2017-09-30 MED ORDER — LOSARTAN POTASSIUM-HCTZ 50-12.5 MG PO TABS: 1.0000 | ORAL_TABLET | Freq: Every day | ORAL | 2 refills | Status: DC

## 2017-09-30 MED ORDER — ATENOLOL 25 MG PO TABS: 25.0000 mg | ORAL_TABLET | Freq: Every day | ORAL | 2 refills | Status: DC

## 2017-10-07 DIAGNOSIS — R0902 Hypoxemia: Secondary | ICD-10-CM | POA: Diagnosis not present

## 2017-10-07 DIAGNOSIS — M7989 Other specified soft tissue disorders: Secondary | ICD-10-CM | POA: Diagnosis not present

## 2017-10-07 DIAGNOSIS — J9610 Chronic respiratory failure, unspecified whether with hypoxia or hypercapnia: Secondary | ICD-10-CM | POA: Diagnosis not present

## 2017-10-07 DIAGNOSIS — J449 Chronic obstructive pulmonary disease, unspecified: Secondary | ICD-10-CM | POA: Diagnosis not present

## 2017-10-09 DIAGNOSIS — G4733 Obstructive sleep apnea (adult) (pediatric): Secondary | ICD-10-CM | POA: Diagnosis not present

## 2017-10-12 ENCOUNTER — Other Ambulatory Visit: Payer: Self-pay

## 2017-10-12 DIAGNOSIS — E1165 Type 2 diabetes mellitus with hyperglycemia: Secondary | ICD-10-CM

## 2017-10-12 MED ORDER — GLIMEPIRIDE 2 MG PO TABS: 2.0000 mg | ORAL_TABLET | Freq: Every day | ORAL | 3 refills | Status: DC

## 2017-10-14 ENCOUNTER — Telehealth: Payer: Self-pay | Admitting: Nurse Practitioner

## 2017-10-17 ENCOUNTER — Other Ambulatory Visit: Payer: Self-pay | Admitting: Nurse Practitioner

## 2017-10-17 DIAGNOSIS — F411 Generalized anxiety disorder: Secondary | ICD-10-CM

## 2017-10-17 MED ORDER — CLONAZEPAM 1 MG PO TABS: 1.0000 mg | ORAL_TABLET | Freq: Two times a day (BID) | ORAL | 2 refills | Status: DC | PRN

## 2017-10-17 MED ORDER — CLONAZEPAM 1 MG PO TABS
1.0000 mg | ORAL_TABLET | Freq: Two times a day (BID) | ORAL | 2 refills | Status: DC | PRN
Start: 2017-10-17 — End: 2017-10-19

## 2017-10-17 NOTE — Progress Notes (Signed)
Renewed current prescription for clonazapam 1mg  twice daily as needed for acute anxiety. New rx sent to [facility] in [place].

## 2017-10-17 NOTE — Telephone Encounter (Signed)
Renewed current prescription for clonazapam 1mg  twice daily as needed for acute anxiety. New rx [facility] [place].

## 2017-10-19 ENCOUNTER — Other Ambulatory Visit: Payer: Self-pay | Admitting: Nurse Practitioner

## 2017-10-19 ENCOUNTER — Telehealth: Payer: Self-pay | Admitting: Nurse Practitioner

## 2017-10-19 DIAGNOSIS — F411 Generalized anxiety disorder: Secondary | ICD-10-CM

## 2017-10-19 MED ORDER — CLONAZEPAM 1 MG PO TABS: 1.0000 mg | ORAL_TABLET | Freq: Two times a day (BID) | ORAL | 1 refills | Status: DC | PRN

## 2017-10-19 NOTE — Telephone Encounter (Signed)
[facility]

## 2017-10-19 NOTE — Progress Notes (Signed)
Sent new rx for [facility]epam to [facility]. Prior prescription [facility] was cancelled.

## 2017-10-19 NOTE — Telephone Encounter (Signed)
Sent new rx for clonazepam to [facility]. Prior prescription to [facility] was cancelled.

## 2017-10-24 ENCOUNTER — Other Ambulatory Visit: Payer: Self-pay | Admitting: Nurse Practitioner

## 2017-10-24 DIAGNOSIS — G894 Chronic pain syndrome: Secondary | ICD-10-CM

## 2017-10-24 MED ORDER — HYDROCODONE-ACETAMINOPHEN 5-325 MG PO TABS: 1.0000 | ORAL_TABLET | Freq: Two times a day (BID) | ORAL | 0 refills | Status: DC

## 2017-10-24 NOTE — Progress Notes (Signed)
Refilled hydrocodone/APAP 5/325mg  bid prn per pharmacy request. Single prescription for #60 tablets was sent to [facility]

## 2017-11-07 DIAGNOSIS — J449 Chronic obstructive pulmonary disease, unspecified: Secondary | ICD-10-CM | POA: Diagnosis not present

## 2017-11-07 DIAGNOSIS — J9610 Chronic respiratory failure, unspecified whether with hypoxia or hypercapnia: Secondary | ICD-10-CM | POA: Diagnosis not present

## 2017-11-07 DIAGNOSIS — R0902 Hypoxemia: Secondary | ICD-10-CM | POA: Diagnosis not present

## 2017-11-07 DIAGNOSIS — M7989 Other specified soft tissue disorders: Secondary | ICD-10-CM | POA: Diagnosis not present

## 2017-11-09 DIAGNOSIS — G4733 Obstructive sleep apnea (adult) (pediatric): Secondary | ICD-10-CM | POA: Diagnosis not present

## 2017-11-20 ENCOUNTER — Other Ambulatory Visit: Payer: Self-pay | Admitting: Internal Medicine

## 2017-11-21 ENCOUNTER — Telehealth: Payer: Self-pay

## 2017-11-21 ENCOUNTER — Other Ambulatory Visit: Payer: Self-pay | Admitting: Adult Health

## 2017-11-21 DIAGNOSIS — G894 Chronic pain syndrome: Secondary | ICD-10-CM

## 2017-11-21 MED ORDER — HYDROCODONE-ACETAMINOPHEN 5-325 MG PO TABS: 1.0000 | ORAL_TABLET | Freq: Two times a day (BID) | ORAL | 0 refills | Status: DC

## 2017-11-21 NOTE — Telephone Encounter (Signed)
Sent to Pharmacy. 

## 2017-11-21 NOTE — Progress Notes (Signed)
Sent Refill for Hydrocodone 60 tablets no refills.

## 2017-11-21 NOTE — Telephone Encounter (Signed)
90 days supply

## 2017-11-22 NOTE — Telephone Encounter (Signed)
Pt son advised med sent to phar

## 2017-12-07 DIAGNOSIS — R0902 Hypoxemia: Secondary | ICD-10-CM | POA: Diagnosis not present

## 2017-12-07 DIAGNOSIS — J449 Chronic obstructive pulmonary disease, unspecified: Secondary | ICD-10-CM | POA: Diagnosis not present

## 2017-12-07 DIAGNOSIS — M7989 Other specified soft tissue disorders: Secondary | ICD-10-CM | POA: Diagnosis not present

## 2017-12-07 DIAGNOSIS — J9610 Chronic respiratory failure, unspecified whether with hypoxia or hypercapnia: Secondary | ICD-10-CM | POA: Diagnosis not present

## 2017-12-09 DIAGNOSIS — G4733 Obstructive sleep apnea (adult) (pediatric): Secondary | ICD-10-CM | POA: Diagnosis not present

## 2017-12-23 ENCOUNTER — Other Ambulatory Visit: Payer: Self-pay | Admitting: Adult Health

## 2017-12-23 ENCOUNTER — Ambulatory Visit (INDEPENDENT_AMBULATORY_CARE_PROVIDER_SITE_OTHER): Payer: Medicare Other | Admitting: Adult Health

## 2017-12-23 ENCOUNTER — Encounter: Payer: Self-pay | Admitting: Adult Health

## 2017-12-23 VITALS — BP 132/72 | HR 78 | Resp 16 | Ht 61.0 in | Wt 189.0 lb

## 2017-12-23 DIAGNOSIS — J449 Chronic obstructive pulmonary disease, unspecified: Secondary | ICD-10-CM

## 2017-12-23 DIAGNOSIS — R300 Dysuria: Secondary | ICD-10-CM | POA: Diagnosis not present

## 2017-12-23 DIAGNOSIS — I10 Essential (primary) hypertension: Secondary | ICD-10-CM

## 2017-12-23 DIAGNOSIS — E782 Mixed hyperlipidemia: Secondary | ICD-10-CM

## 2017-12-23 DIAGNOSIS — Z6835 Body mass index (BMI) 35.0-35.9, adult: Secondary | ICD-10-CM

## 2017-12-23 DIAGNOSIS — E079 Disorder of thyroid, unspecified: Secondary | ICD-10-CM | POA: Diagnosis not present

## 2017-12-23 DIAGNOSIS — Z0001 Encounter for general adult medical examination with abnormal findings: Secondary | ICD-10-CM | POA: Diagnosis not present

## 2017-12-23 DIAGNOSIS — R5383 Other fatigue: Secondary | ICD-10-CM | POA: Diagnosis not present

## 2017-12-23 DIAGNOSIS — Z13220 Encounter for screening for lipoid disorders: Secondary | ICD-10-CM | POA: Diagnosis not present

## 2017-12-23 DIAGNOSIS — E039 Hypothyroidism, unspecified: Secondary | ICD-10-CM | POA: Diagnosis not present

## 2017-12-23 DIAGNOSIS — E0781 Sick-euthyroid syndrome: Secondary | ICD-10-CM | POA: Diagnosis not present

## 2017-12-23 DIAGNOSIS — R5381 Other malaise: Secondary | ICD-10-CM | POA: Diagnosis not present

## 2017-12-23 DIAGNOSIS — E1165 Type 2 diabetes mellitus with hyperglycemia: Secondary | ICD-10-CM | POA: Diagnosis not present

## 2017-12-23 DIAGNOSIS — Z9981 Dependence on supplemental oxygen: Secondary | ICD-10-CM

## 2017-12-23 DIAGNOSIS — G894 Chronic pain syndrome: Secondary | ICD-10-CM

## 2017-12-23 DIAGNOSIS — J4520 Mild intermittent asthma, uncomplicated: Secondary | ICD-10-CM

## 2017-12-23 LAB — POCT GLYCOSYLATED HEMOGLOBIN (HGB A1C): HEMOGLOBIN A1C: 6.1 % — AB (ref 4.0–5.6)

## 2017-12-23 MED ORDER — PROAIR HFA 108 (90 BASE) MCG/ACT IN AERS: 2.0000 | INHALATION_SPRAY | RESPIRATORY_TRACT | 2 refills | Status: DC | PRN

## 2017-12-23 MED ORDER — ACCU-CHEK FASTCLIX LANCETS MISC: 2 refills | Status: DC

## 2017-12-23 MED ORDER — HYDROCODONE-ACETAMINOPHEN 5-325 MG PO TABS: 1.0000 | ORAL_TABLET | Freq: Two times a day (BID) | ORAL | 0 refills | Status: DC

## 2017-12-23 NOTE — Patient Instructions (Signed)

## 2017-12-23 NOTE — Progress Notes (Signed)
[facility] [place], [place] [address]  Internal MEDICINE  Office Visit Note  Patient Name: Lynn Robbins  [id number]  [id number]  Date of Service: 12/23/2017  Chief Complaint  Patient presents with  . Annual Exam    medicare well visit  , medication refills   . Diabetes  . Hypertension  . Depression  . Quality Metric Gaps    eye exam     HPI Pt is here for routine health maintenance examination.  Patient is a frail 77 year old Caucasian female.  She is here today for her annual wellness visit and medication refills.  She is currently being treated for diabetes, hypertension, and depression.  Her diabetes is well controlled her A1c is 6.1 at this visit.  I encouraged patient to continue to make good choices with her diet.  Her blood pressure is well controlled at this time on current medications, and I encouraged continued compliance with that.  Patient denies any current depression and is smiling and laughing in the room.  Current Medication: Outpatient Encounter Medications as of 12/23/2017  Medication Sig Note  . atenolol (TENORMIN) 25 MG tablet Take 1 tablet (25 mg total) by mouth daily.   . clonazePAM (KLONOPIN) 1 MG tablet Take 1 tablet (1 mg total) by mouth 2 (two) times daily as needed for anxiety.   Marland Kitchen glimepiride (AMARYL) 2 MG tablet Take 1 tablet (2 mg total) by mouth daily with breakfast.   . HYDROcodone-acetaminophen (NORCO/VICODIN) 5-325 MG tablet Take 1 tablet by mouth 2 (two) times daily.   Marland Kitchen losartan-hydrochlorothiazide (HYZAAR) 50-12.5 MG tablet Take 1 tablet by mouth daily.   . naproxen (NAPROSYN) 500 MG tablet Take 1 tablet by mouth 2 (two) times daily. 01/05/2016: Received from: External Pharmacy Received Sig: TAKE 1 TABLET(S) BY MOUTH TWICE A DAY AS NEEDED FOR PAIN AND INFMALLATION  . OXYGEN Inhale into the lungs.   Marland Kitchen PROAIR HFA 108 (90 Base) MCG/ACT inhaler Inhale 2 puffs into the lungs every 4 (four) hours as needed for wheezing.    . SitaGLIPtin-MetFORMIN HCl (JANUMET XR) 50-500 MG TB24 TAKE ONE TAB A DAY WITH FOOD FOR DIABETES   . SPIRIVA HANDIHALER 18 MCG inhalation capsule INHALE 1 CAPSULE VIA HANDIHALER ONCE DAILY AT THE SAME TIME EVERY DAY   . [DISCONTINUED] HYDROcodone-acetaminophen (NORCO/VICODIN) 5-325 MG tablet Take 1 tablet by mouth 2 (two) times daily.   . [DISCONTINUED] PROAIR HFA 108 (90 Base) MCG/ACT inhaler Inhale 2 puffs into the lungs every 4 (four) hours as needed for wheezing.    No facility-administered encounter medications on file as of 12/23/2017.     Surgical History: Past Surgical History:  Procedure Laterality Date  . CESAREAN SECTION      Medical History: Past Medical History:  Diagnosis Date  . CHF (congestive heart failure) ([place])   . COPD (chronic obstructive pulmonary disease) ([place])   . Depression   . Diabetes mellitus without complication ([place])   . Hypertension     Family History: Family History  Problem Relation Age of Onset  . Diabetes Father    Depression screen [facility] 2/9 12/23/2017 09/20/2017 02/03/2017  Decreased Interest 0 0 1  Down, Depressed, Hopeless 0 0 0  PHQ - 2 Score 0 0 1    Functional Status Survey: Is the patient deaf or have difficulty hearing?: Yes Does the patient have difficulty seeing, even when wearing glasses/contacts?: No Does the patient have difficulty concentrating, remembering, or making decisions?: Yes Does the patient have difficulty  walking or climbing stairs?: Yes Does the patient have difficulty dressing or bathing?: No Does the patient have difficulty doing errands alone such as visiting a doctor's office or shopping?: Yes  MMSE - [facility] Exam 12/23/2017  Orientation to time 5  Orientation to Place 5  Registration 3  Attention/ Calculation 5  Recall 3  Language- name 2 objects 2  Language- repeat 1  Language- follow 3 step command 3  Language- read & follow direction 1  Write a sentence 1  Copy design 1  Total score 30     Fall Risk  12/23/2017 09/20/2017 02/03/2017  Falls in the past year? 0 No No      Review of Systems  Constitutional: Negative for chills, fatigue and unexpected weight change.  HENT: Negative for congestion, rhinorrhea, sneezing and sore throat.   Eyes: Negative for photophobia, pain and redness.  Respiratory: Negative for cough, chest tightness and shortness of breath.   Cardiovascular: Negative for chest pain and palpitations.  Gastrointestinal: Negative for abdominal pain, constipation, diarrhea, nausea and vomiting.  Endocrine: Negative.   Genitourinary: Negative for dysuria and frequency.  Musculoskeletal: Negative for arthralgias, back pain, joint swelling and neck pain.  Skin: Negative for rash.  Allergic/Immunologic: Negative.   Neurological: Negative for tremors and numbness.  Hematological: Negative for adenopathy. Does not bruise/bleed easily.  Psychiatric/Behavioral: Negative for behavioral problems and sleep disturbance. The patient is not nervous/anxious.      Vital Signs: BP 132/72 (BP Location: Left Arm, Patient Position: Sitting, Cuff Size: Normal)   Pulse 78   Resp 16   Ht 5\' 1"  (1.549 m)   Wt 189 lb (85.7 kg)   SpO2 96% Comment: 2.5 liters  BMI 35.71 kg/m    Physical Exam  Constitutional: She is oriented to person, place, and time. She appears well-developed and well-nourished. No distress.  HENT:  Head: Normocephalic and atraumatic.  Mouth/Throat: Oropharynx is clear and moist. No oropharyngeal exudate.  Eyes: Pupils are equal, round, and reactive to light. EOM are normal.  Neck: Normal range of motion. Neck supple. No JVD present. No tracheal deviation present. No thyromegaly present.  Cardiovascular: Normal rate, regular rhythm and normal heart sounds. Exam reveals no gallop and no friction rub.  No murmur heard. Pulmonary/Chest: Effort normal and breath sounds normal. No respiratory distress. She has no wheezes. She has no rales. She exhibits no  tenderness.  Abdominal: Soft. There is no tenderness. There is no guarding.  Musculoskeletal: Normal range of motion.  Lymphadenopathy:    She has no cervical adenopathy.  Neurological: She is alert and oriented to person, place, and time. No cranial nerve deficit.  Skin: Skin is warm and dry. She is not diaphoretic.  Psychiatric: She has a normal mood and affect. Her behavior is normal. Judgment and thought content normal.  Nursing note and vitals reviewed.    LABS: Recent Results (from the past 2160 hour(s))  POCT HgB A1C     Status: Abnormal   Collection Time: 12/23/17 11:35 AM  Result Value Ref Range   Hemoglobin A1C 6.1 (A) 4.0 - 5.6 %   HbA1c POC (<> result, manual entry)     HbA1c, POC (prediabetic range)     HbA1c, POC (controlled diabetic range)      Assessment/Plan: 1. Encounter for general adult medical examination with abnormal findings Patient is up-to-date on preventative health maintenance except for eye exam.  She does not have a ride and is not interested in getting an eye  exam at this time.  Will postpone for 1 year. - CBC with Differential/Platelet - Comprehensive metabolic panel - Lipid Panel With LDL/HDL Ratio - T4, free - TSH  2. Uncontrolled type 2 diabetes mellitus with hyperglycemia ([place]) Patient's A1c 6.1 today down from 6.5 in August.  Patient should continue current medication regimen as directed. - POCT HgB A1C  3. Essential hypertension Patient's blood pressure well controlled at this time continue current medication.  4. Chronic obstructive pulmonary disease, unspecified COPD type ([place]) Stable at this time continue using inhalers as directed patient also uses oxygen 0.5 L/min day and night.  She should continue using oxygen as directed.  5. Hypothyroidism, unspecified type We will evaluate thyroid with current lab work.  Once results are available will treat as needed  6. Mixed hyperlipidemia No recent lipid panel on patient.  Ordered and  drawn today will evaluate and treat as indicated.  7. Chronic pain disorder Refilled patient's Norco. - HYDROcodone-acetaminophen (NORCO/VICODIN) 5-325 MG tablet; Take 1 tablet by mouth 2 (two) times daily.  Dispense: 60 tablet; Refill: 0  8. Intermittent asthma without complication, unspecified asthma severity Patient's breathing is doing well she continue use oxygen at 2.5 L/min via nasal cannula.  Pro-air refilled. - PROAIR HFA 108 (90 Base) MCG/ACT inhaler; Inhale 2 puffs into the lungs every 4 (four) hours as needed for wheezing.  Dispense: 1 Inhaler; Refill: 2  9. Oxygen dependent Patient remains dependent on oxygen 2.5 L via cannula she wears oxygen day and night and reports good resolution of symptoms with oxygen supplementation.  10. Dysuria - UA/M w/rflx Culture, Routine  11. Class 2 severe obesity due to excess calories with serious comorbidity and body mass index (BMI) of 35.0 to 35.9 in adult [facility]) Obesity Counseling: Risk Assessment: An assessment of behavioral risk factors was made today and includes lack of exercise sedentary lifestyle, lack of portion control and poor dietary habits.  Risk Modification Advice: She was counseled on portion control guidelines. Restricting daily caloric intake to. . The detrimental long term effects of obesity on her health and ongoing poor compliance was also discussed with the patient.   General Counseling: Lynn Robbins understanding of the findings of todays visit and agrees with plan of treatment. I have discussed any further diagnostic evaluation that may be needed or ordered today. We also reviewed her medications today. she has been encouraged to call the office with any questions or concerns that should arise related to todays visit.   Orders Placed This Encounter  Procedures  . UA/M w/rflx Culture, Routine  . CBC with Differential/Platelet  . Comprehensive metabolic panel  . Lipid Panel With LDL/HDL Ratio  . T4, free  . TSH   . POCT HgB A1C    Meds ordered this encounter  Medications  . HYDROcodone-acetaminophen (NORCO/VICODIN) 5-325 MG tablet    Sig: Take 1 tablet by mouth 2 (two) times daily.    Dispense:  60 tablet    Refill:  0  . PROAIR HFA 108 (90 Base) MCG/ACT inhaler    Sig: Inhale 2 puffs into the lungs every 4 (four) hours as needed for wheezing.    Dispense:  1 Inhaler    Refill:  2    Time spent: 30 Minutes   This patient was seen by Orson Gear AGNP-C in Collaboration with Dr [name] as a part of collaborative care agreement    [name] AGNP-C Internal Medicine

## 2017-12-25 LAB — COMPREHENSIVE METABOLIC PANEL
A/G RATIO: 1.5 (ref 1.2–2.2)
ALT: 7 IU/L (ref 0–32)
AST: 15 IU/L (ref 0–40)
Albumin: 3.9 g/dL (ref 3.5–4.8)
Alkaline Phosphatase: 92 IU/L (ref 39–117)
BUN / CREAT RATIO: 24 (ref 12–28)
BUN: 16 mg/dL (ref 8–27)
CHLORIDE: 98 mmol/L (ref 96–106)
CO2: 32 mmol/L — ABNORMAL HIGH (ref 20–29)
Calcium: 9.6 mg/dL (ref 8.7–10.3)
Creatinine, Ser: 0.67 mg/dL (ref 0.57–1.00)
GFR calc non Af Amer: 86 mL/min/{1.73_m2} (ref >59)
GFR, EST AFRICAN AMERICAN: 99 mL/min/{1.73_m2} (ref >59)
Globulin, Total: 2.6 g/dL (ref 1.5–4.5)
Glucose: 160 mg/dL — ABNORMAL HIGH (ref 65–99)
POTASSIUM: 4.7 mmol/L (ref 3.5–5.2)
SODIUM: 146 mmol/L — AB (ref 134–144)
TOTAL PROTEIN: 6.5 g/dL (ref 6.0–8.5)

## 2017-12-25 LAB — CBC WITH DIFFERENTIAL/PLATELET
Basophils Absolute: 0 x10E3/uL (ref 0.0–0.2)
Basos: 1 %
EOS (ABSOLUTE): 0.2 x10E3/uL (ref 0.0–0.4)
Eos: 3 %
Hematocrit: 34.3 % (ref 34.0–46.6)
Hemoglobin: 11 g/dL — ABNORMAL LOW (ref 11.1–15.9)
Immature Grans (Abs): 0 x10E3/uL (ref 0.0–0.1)
Immature Granulocytes: 0 %
Lymphocytes Absolute: 1.8 x10E3/uL (ref 0.7–3.1)
Lymphs: 23 %
MCH: 29.5 pg (ref 26.6–33.0)
MCHC: 32.1 g/dL (ref 31.5–35.7)
MCV: 92 fL (ref 79–97)
Monocytes Absolute: 0.6 x10E3/uL (ref 0.1–0.9)
Monocytes: 8 %
Neutrophils Absolute: 5 x10E3/uL (ref 1.4–7.0)
Neutrophils: 65 %
Platelets: 207 x10E3/uL (ref 150–450)
RBC: 3.73 x10E6/uL — ABNORMAL LOW (ref 3.77–5.28)
RDW: 13.2 % (ref 12.3–15.4)
WBC: 7.7 x10E3/uL (ref 3.4–10.8)

## 2017-12-25 LAB — LIPID PANEL WITH LDL/HDL RATIO
Cholesterol, Total: 171 mg/dL (ref 100–199)
HDL: 54 mg/dL (ref >39)
LDL Calculated: 82 mg/dL (ref 0–99)
LDl/HDL Ratio: 1.5 ratio (ref 0.0–3.2)
Triglycerides: 175 mg/dL — ABNORMAL HIGH (ref 0–149)
VLDL Cholesterol Cal: 35 mg/dL (ref 5–40)

## 2017-12-25 LAB — TSH: TSH: 2.13 u[IU]/mL (ref 0.450–4.500)

## 2017-12-25 LAB — T4, FREE: FREE T4: 1.04 ng/dL (ref 0.82–1.77)

## 2017-12-26 ENCOUNTER — Telehealth: Payer: Self-pay

## 2017-12-26 ENCOUNTER — Other Ambulatory Visit: Payer: Self-pay | Admitting: Adult Health

## 2017-12-26 LAB — UA/M W/RFLX CULTURE, ROUTINE
Bilirubin, UA: NEGATIVE
Glucose, UA: NEGATIVE
Nitrite, UA: NEGATIVE
Protein, UA: NEGATIVE
RBC, UA: NEGATIVE
Specific Gravity, UA: 1.024 (ref 1.005–1.030)
Urobilinogen, Ur: 0.2 mg/dL (ref 0.2–1.0)
pH, UA: 5 (ref 5.0–7.5)

## 2017-12-26 LAB — MICROSCOPIC EXAMINATION
Epithelial Cells (non renal): >10 /hpf — AB (ref 0–10)
WBC, UA: >30 /hpf — AB (ref 0–5)

## 2017-12-26 LAB — URINE CULTURE, REFLEX

## 2017-12-26 MED ORDER — AMOXICILLIN-POT CLAVULANATE 875-125 MG PO TABS: 1.0000 | ORAL_TABLET | Freq: Two times a day (BID) | ORAL | 0 refills | Status: DC

## 2017-12-26 NOTE — Telephone Encounter (Signed)
-----   Message from [name], NP sent at 12/26/2017  2:46 PM EST ----- Pt has UTI from UA.  Sent Augmentin to Lynn Robbins for patient.  Please let her know.

## 2017-12-26 NOTE — Telephone Encounter (Signed)
Informed pt of urine results and let her know that her rx was at the pharmacy

## 2017-12-26 NOTE — Progress Notes (Signed)
Urine culture reveals UTI.  Sending Augmentin to pharmacy.

## 2018-01-07 DIAGNOSIS — R0902 Hypoxemia: Secondary | ICD-10-CM | POA: Diagnosis not present

## 2018-01-07 DIAGNOSIS — J449 Chronic obstructive pulmonary disease, unspecified: Secondary | ICD-10-CM | POA: Diagnosis not present

## 2018-01-07 DIAGNOSIS — M7989 Other specified soft tissue disorders: Secondary | ICD-10-CM | POA: Diagnosis not present

## 2018-01-07 DIAGNOSIS — J9610 Chronic respiratory failure, unspecified whether with hypoxia or hypercapnia: Secondary | ICD-10-CM | POA: Diagnosis not present

## 2018-01-09 DIAGNOSIS — G4733 Obstructive sleep apnea (adult) (pediatric): Secondary | ICD-10-CM | POA: Diagnosis not present

## 2018-01-10 ENCOUNTER — Telehealth: Payer: Self-pay

## 2018-01-10 ENCOUNTER — Other Ambulatory Visit: Payer: Self-pay

## 2018-01-10 MED ORDER — SULFAMETHOXAZOLE-TRIMETHOPRIM 800-160 MG PO TABS: 1.0000 | ORAL_TABLET | Freq: Two times a day (BID) | ORAL | 0 refills | Status: DC

## 2018-01-10 NOTE — Telephone Encounter (Signed)
Pt son advised we send bactrim she don;t feel better need to been seen

## 2018-01-23 ENCOUNTER — Other Ambulatory Visit: Payer: Self-pay | Admitting: Adult Health

## 2018-01-23 ENCOUNTER — Telehealth: Payer: Self-pay

## 2018-01-23 DIAGNOSIS — G894 Chronic pain syndrome: Secondary | ICD-10-CM

## 2018-01-23 MED ORDER — HYDROCODONE-ACETAMINOPHEN 5-325 MG PO TABS: 1.0000 | ORAL_TABLET | Freq: Two times a day (BID) | ORAL | 0 refills | Status: DC

## 2018-01-23 NOTE — Progress Notes (Signed)
Refilled patients Norco for 2 more months, as the RX on 12/23/2017 was only for one month and should have been for 3 months.

## 2018-01-23 NOTE — Telephone Encounter (Signed)
Done

## 2018-01-23 NOTE — Telephone Encounter (Signed)
Pt advised we send med  

## 2018-02-06 DIAGNOSIS — J449 Chronic obstructive pulmonary disease, unspecified: Secondary | ICD-10-CM | POA: Diagnosis not present

## 2018-02-06 DIAGNOSIS — M7989 Other specified soft tissue disorders: Secondary | ICD-10-CM | POA: Diagnosis not present

## 2018-02-06 DIAGNOSIS — R0902 Hypoxemia: Secondary | ICD-10-CM | POA: Diagnosis not present

## 2018-02-06 DIAGNOSIS — J9610 Chronic respiratory failure, unspecified whether with hypoxia or hypercapnia: Secondary | ICD-10-CM | POA: Diagnosis not present

## 2018-02-08 DIAGNOSIS — G4733 Obstructive sleep apnea (adult) (pediatric): Secondary | ICD-10-CM | POA: Diagnosis not present

## 2018-02-19 ENCOUNTER — Other Ambulatory Visit: Payer: Self-pay | Admitting: Internal Medicine

## 2018-02-19 DIAGNOSIS — J4520 Mild intermittent asthma, uncomplicated: Secondary | ICD-10-CM

## 2018-02-23 ENCOUNTER — Telehealth: Payer: Self-pay | Admitting: Internal Medicine

## 2018-02-23 NOTE — Telephone Encounter (Signed)
Faxed oxygen order to [facility] and put into [email or web address]

## 2018-03-09 DIAGNOSIS — J449 Chronic obstructive pulmonary disease, unspecified: Secondary | ICD-10-CM | POA: Diagnosis not present

## 2018-03-09 DIAGNOSIS — M7989 Other specified soft tissue disorders: Secondary | ICD-10-CM | POA: Diagnosis not present

## 2018-03-09 DIAGNOSIS — R0902 Hypoxemia: Secondary | ICD-10-CM | POA: Diagnosis not present

## 2018-03-09 DIAGNOSIS — J9610 Chronic respiratory failure, unspecified whether with hypoxia or hypercapnia: Secondary | ICD-10-CM | POA: Diagnosis not present

## 2018-03-11 DIAGNOSIS — G4733 Obstructive sleep apnea (adult) (pediatric): Secondary | ICD-10-CM | POA: Diagnosis not present

## 2018-03-23 ENCOUNTER — Other Ambulatory Visit: Payer: Self-pay | Admitting: Adult Health

## 2018-03-23 DIAGNOSIS — G894 Chronic pain syndrome: Secondary | ICD-10-CM

## 2018-03-23 MED ORDER — HYDROCODONE-ACETAMINOPHEN 5-325 MG PO TABS: 1.0000 | ORAL_TABLET | Freq: Two times a day (BID) | ORAL | 0 refills | Status: DC

## 2018-03-28 ENCOUNTER — Ambulatory Visit: Payer: Self-pay | Admitting: Adult Health

## 2018-04-03 ENCOUNTER — Ambulatory Visit: Payer: Self-pay | Admitting: Adult Health

## 2018-04-03 ENCOUNTER — Ambulatory Visit (INDEPENDENT_AMBULATORY_CARE_PROVIDER_SITE_OTHER): Payer: Medicare Other | Admitting: Adult Health

## 2018-04-03 VITALS — BP 132/80 | HR 83 | Resp 16 | Ht 60.0 in | Wt 187.0 lb

## 2018-04-03 DIAGNOSIS — J449 Chronic obstructive pulmonary disease, unspecified: Secondary | ICD-10-CM

## 2018-04-03 DIAGNOSIS — G894 Chronic pain syndrome: Secondary | ICD-10-CM | POA: Diagnosis not present

## 2018-04-03 DIAGNOSIS — E1165 Type 2 diabetes mellitus with hyperglycemia: Secondary | ICD-10-CM

## 2018-04-03 DIAGNOSIS — I10 Essential (primary) hypertension: Secondary | ICD-10-CM

## 2018-04-03 DIAGNOSIS — Z9981 Dependence on supplemental oxygen: Secondary | ICD-10-CM

## 2018-04-03 LAB — POCT GLYCOSYLATED HEMOGLOBIN (HGB A1C): HEMOGLOBIN A1C: 6.2 % — AB (ref 4.0–5.6)

## 2018-04-03 MED ORDER — HYDROCODONE-ACETAMINOPHEN 5-325 MG PO TABS: 1.0000 | ORAL_TABLET | Freq: Two times a day (BID) | ORAL | 0 refills | Status: DC

## 2018-04-03 NOTE — Patient Instructions (Signed)
Hypoxia Hypoxia is a condition that happens when there is a lack of oxygen in the body's tissues and organs. When there is not enough oxygen, organs cannot work as they should. This causes serious problems throughout the body and in the brain. What are the causes? This condition may be caused by:  Exposure to high altitude.  A collapsed lung (pneumothorax).  Lung infection (pneumonia).  Lung injury.  Long-term (chronic) lung disease, such as COPD (chronic obstructive pulmonary disease).  Blood collecting in the chest cavity (hemothorax).  Food, saliva, or vomit getting into the airway (aspiration).  Reduced blood flow (ischemia).  Severe blood loss.  Slow or shallow breathing (hypoventilation).  Blood disorders, such as anemia.  Carbon monoxide poisoning.  The heart suddenly stopping (cardiac arrest).  Anesthetic medicines.  Drowning.  Choking. What are the signs or symptoms? Symptoms of this condition include:  Headache.  Fatigue.  Drowsiness.  Forgetfulness.  Nausea.  Confusion.  Shortness of breath.  Dizziness.  Bluish color of the skin, lips, or nail beds (cyanosis).  Change in consciousness or awareness. If hypoxia is not treated, it can lead to convulsions, loss of consciousness (coma), or brain damage. How is this diagnosed? This condition may be diagnosed based on:  A physical exam.  Blood tests.  A test that measures how much oxygen is in your blood (pulse oximetry). This is done with a sensor that is placed on your finger, toe, or earlobe.  Chest X-ray.  Tests to check your lung function (pulmonary function tests).  A test to check the electrical activity of your heart (electrocardiogram, ECG). You may have other tests to determine the cause of your hypoxia. How is this treated?  Treatment for this condition depends on what is causing the hypoxia. You will likely be treated with oxygen therapy. This may be done by giving you oxygen  through a face mask or through tubes in your nose. Your health care provider may also recommend other therapies to treat the underlying cause of your hypoxia. Follow these instructions at home:  Take over-the-counter and prescription medicines only as told by your health care provider.  Do not use any products that contain nicotine or tobacco, such as cigarettes and e-cigarettes. If you need help quitting, ask your health care provider.  Avoid secondhand smoke.  Work with your health care provider to manage any chronic conditions you have that may be causing hypoxia, such as COPD.  Keep all follow-up visits as told by your health care provider. This is important. Contact a health care provider if:  You have a fever.  You have trouble breathing, even after treatment.  You become extremely short of breath when you exercise. Get help right away if:  Your shortness of breath gets worse, especially with normal or very little activity.  Your skin, lips, or nail beds have a bluish color.  You become confused or you cannot think properly.  You have chest pain. Summary  Hypoxia is a condition that happens when there is a lack of oxygen in the body's tissues and organs.  If hypoxia is not treated, it can lead to convulsions, loss of consciousness (coma), or brain damage.  Symptoms of hypoxia can include a headache, shortness of breath, confusion, nausea, and a bluish skin color.  Hypoxia has many possible causes, including exposure to high altitude, carbon monoxide poisoning, or other health issues, such as blood disorders or cardiac arrest.  Hypoxia is usually treated with oxygen therapy. This information is not   intended to replace advice given to you by your health care provider. Make sure you discuss any questions you have with your health care provider. Document Released: 03/22/2016 Document Revised: 03/22/2016 Document Reviewed: 03/22/2016 Elsevier Interactive Patient Education   2019 [employer].

## 2018-04-03 NOTE — Progress Notes (Signed)
[facility] [place], [place] [address]  Internal MEDICINE  Office Visit Note  Patient Name: Lynn Robbins  [id number]  [id number]  Date of Service: 06/27/2018  Chief Complaint  Patient presents with  . COPD    patient is needing new o2 company  ,pt cn not walk  . Diabetes  . Hypertension    HPI Pt is here for follow up on COPD, DM and HTN.  Pt reports her current company is no longer supplying liquid O2.  They new system they replaced her oxygen with is too heavy for her to carry.  She has difficulty walking. Her room air oxygen saturation 88%. She is currently using a borrowed [employer], and can carry that.  She needs a light weight concentrator or bottle that she can manage with her walker. She denies chest pain, sob or headaches.  She denies issues with her BP or DM.      Current Medication: Outpatient Encounter Medications as of 04/03/2018  Medication Sig Note  . ACCU-CHEK FASTCLIX LANCETS MISC Use as directed to check sugars. DX E11.65   . glimepiride (AMARYL) 2 MG tablet Take 1 tablet (2 mg total) by mouth daily with breakfast.   . HYDROcodone-acetaminophen (NORCO/VICODIN) 5-325 MG tablet Take 1 tablet by mouth 2 (two) times daily.   . naproxen (NAPROSYN) 500 MG tablet Take 1 tablet by mouth 2 (two) times daily. 01/05/2016: Received from: External Pharmacy Received Sig: TAKE 1 TABLET(S) BY MOUTH TWICE A DAY AS NEEDED FOR PAIN AND INFMALLATION  . OXYGEN Inhale into the lungs.   . SPIRIVA HANDIHALER 18 MCG inhalation capsule INHALE 1 CAPSULE VIA HANDIHALER ONCE DAILY AT THE SAME TIME EVERY DAY   . [DISCONTINUED] atenolol (TENORMIN) 25 MG tablet Take 1 tablet (25 mg total) by mouth daily.   . [DISCONTINUED] clonazePAM (KLONOPIN) 1 MG tablet Take 1 tablet (1 mg total) by mouth 2 (two) times daily as needed for anxiety.   . [DISCONTINUED] losartan-hydrochlorothiazide (HYZAAR) 50-12.5 MG tablet Take 1 tablet by mouth daily.   . [DISCONTINUED] PROAIR HFA 108  (90 Base) MCG/ACT inhaler 2 PUFFS EVERY 4 HOURS AS NEEDED   . [DISCONTINUED] SitaGLIPtin-MetFORMIN HCl (JANUMET XR) 50-500 MG TB24 TAKE ONE TAB A DAY WITH FOOD FOR DIABETES   . HYDROcodone-acetaminophen (NORCO/VICODIN) 5-325 MG tablet Take 1 tablet by mouth 2 (two) times daily.   . [DISCONTINUED] amoxicillin-clavulanate (AUGMENTIN) 875-125 MG tablet Take 1 tablet by mouth 2 (two) times daily. (Patient not taking: Reported on 04/03/2018)   . [DISCONTINUED] HYDROcodone-acetaminophen (NORCO/VICODIN) 5-325 MG tablet Take 1 tablet by mouth 2 (two) times daily.   . [DISCONTINUED] HYDROcodone-acetaminophen (NORCO/VICODIN) 5-325 MG tablet Take 1 tablet by mouth 2 (two) times daily.   . [DISCONTINUED] sulfamethoxazole-trimethoprim (BACTRIM DS,SEPTRA DS) 800-160 MG tablet Take 1 tablet by mouth 2 (two) times daily. (Patient not taking: Reported on 04/03/2018)    No facility-administered encounter medications on file as of 04/03/2018.     Surgical History: Past Surgical History:  Procedure Laterality Date  . CESAREAN SECTION      Medical History: Past Medical History:  Diagnosis Date  . CHF (congestive heart failure) ([place])   . COPD (chronic obstructive pulmonary disease) ([place])   . Depression   . Diabetes mellitus without complication ([place])   . HOH (hard of hearing)   . Hypertension     Family History: Family History  Problem Relation Age of Onset  . Diabetes Father   . Colon cancer Father   .  Colon cancer Mother   . Aneurysm Mother 5       brain  . Lung cancer Sister   . Breast cancer Sister   . Lung cancer Brother   . Lung cancer Sister   . Bone cancer Brother   . Lung cancer Brother   . Cancer Sister        type unknown    Social History   Socioeconomic History  . Marital status: Single    Spouse name: Not on file  . Number of children: 5  . Years of education: Not on file  . Highest education level: Not on file  Occupational History  . Not on file  Social Needs  .  Financial resource strain: Not on file  . Food insecurity:    Worry: Not on file    Inability: Not on file  . Transportation needs:    Medical: Not on file    Non-medical: Not on file  Tobacco Use  . Smoking status: Former Smoker    Packs/day: 3.00    Years: 57.00    Pack years: 171.00    Types: Cigarettes    Last attempt to quit: 04/17/1998    Years since quitting: 20.2  . Smokeless tobacco: Current User    Types: Snuff  Substance and Sexual Activity  . Alcohol use: No  . Drug use: No  . Sexual activity: Never  Lifestyle  . Physical activity:    Days per week: Not on file    Minutes per session: Not on file  . Stress: Not on file  Relationships  . Social connections:    Talks on phone: Not on file    Gets together: Not on file    Attends religious service: Not on file    Active member of club or organization: Not on file    Attends meetings of clubs or organizations: Not on file    Relationship status: Not on file  . Intimate partner violence:    Fear of current or ex partner: Not on file    Emotionally abused: Not on file    Physically abused: Not on file    Forced sexual activity: Not on file  Other Topics Concern  . Not on file  Social History Narrative   ** Merged History Encounter **          Review of Systems  Constitutional: Negative for chills, fatigue and unexpected weight change.  HENT: Negative for congestion, rhinorrhea, sneezing and sore throat.   Eyes: Negative for photophobia, pain and redness.  Respiratory: Positive for shortness of breath. Negative for cough and chest tightness.   Cardiovascular: Negative for chest pain and palpitations.  Gastrointestinal: Negative for abdominal pain, constipation, diarrhea, nausea and vomiting.  Endocrine: Negative.   Genitourinary: Negative for dysuria and frequency.  Musculoskeletal: Negative for arthralgias, back pain, joint swelling and neck pain.  Skin: Negative for rash.  Allergic/Immunologic:  Negative.   Neurological: Negative for tremors and numbness.  Hematological: Negative for adenopathy. Does not bruise/bleed easily.  Psychiatric/Behavioral: Negative for behavioral problems and sleep disturbance. The patient is not nervous/anxious.     Vital Signs: BP 132/80   Pulse 83   Resp 16   Ht 5' (1.524 m)   Wt 187 lb (84.8 kg)   SpO2 93% Comment: 2 liter oxygen  BMI 36.52 kg/m    Physical Exam Vitals signs and nursing note reviewed.  Constitutional:      General: She is not in acute distress.  Appearance: She is well-developed. She is not diaphoretic.  HENT:     Head: Normocephalic and atraumatic.     Mouth/Throat:     Pharynx: No oropharyngeal exudate.  Eyes:     Pupils: Pupils are equal, round, and reactive to light.  Neck:     Musculoskeletal: Normal range of motion and neck supple.     Thyroid: No thyromegaly.     Vascular: No JVD.     Trachea: No tracheal deviation.  Cardiovascular:     Rate and Rhythm: Normal rate and regular rhythm.     Heart sounds: Normal heart sounds. No murmur. No friction rub. No gallop.   Pulmonary:     Effort: No respiratory distress.     Breath sounds: Rhonchi present. No wheezing or rales.     Comments: Increased effort, equal expansion. Chest:     Chest wall: No tenderness.  Abdominal:     Palpations: Abdomen is soft.     Tenderness: There is no abdominal tenderness. There is no guarding.  Musculoskeletal: Normal range of motion.  Lymphadenopathy:     Cervical: No cervical adenopathy.  Skin:    General: Skin is warm and dry.  Neurological:     Mental Status: She is alert and oriented to person, place, and time.     Cranial Nerves: No cranial nerve deficit.  Psychiatric:        Behavior: Behavior normal.        Thought Content: Thought content normal.        Judgment: Judgment normal.     Assessment/Plan: 1. Uncontrolled type 2 diabetes mellitus with hyperglycemia (HCC) A1C remains stable at 6.2.  Once again  discussed dietary changes to help patient keep A1C lower.  - POCT HgB A1C  2. Essential hypertension Stable, continue current medication therapy.    3. Chronic pain disorder Refilled patient Norco. Reviewed risks and possible side effects associated with taking opiates, benzodiazepines and other CNS depressants. Combination of these could cause dizziness and drowsiness. Advised patient not to drive or operate machinery when taking these medications, as patient's and other's life can be at risk and will have consequences. Patient verbalized understanding in this matter. Dependence and abuse for these drugs will be monitored closely. A Controlled substance policy and procedure is on file which allows [facility] to order a urine drug screen test at any visit. Patient understands and agrees with the plan - HYDROcodone-acetaminophen (NORCO/VICODIN) 5-325 MG tablet; Take 1 tablet by mouth 2 (two) times daily.  Dispense: 60 tablet; Refill: 0 - HYDROcodone-acetaminophen (NORCO/VICODIN) 5-325 MG tablet; Take 1 tablet by mouth 2 (two) times daily.  Dispense: 60 tablet; Refill: 0 - HYDROcodone-acetaminophen (NORCO/VICODIN) 5-325 MG tablet; Take 1 tablet by mouth 2 (two) times daily.  Dispense: 60 tablet; Refill: 0  4. Chronic obstructive pulmonary disease, unspecified COPD type ([place]) PT would benefit from a light weight portable oxygen concentrator.  Her current oxygen concentrator is too heavy.  - PR PORTABLE OXYGEN CONCENTRATOR  5. Oxygen dependent - PR PORTABLE OXYGEN CONCENTRATOR  General Counseling: maryln eastham understanding of the findings of todays visit and agrees with plan of treatment. I have discussed any further diagnostic evaluation that may be needed or ordered today. We also reviewed her medications today. she has been encouraged to call the office with any questions or concerns that should arise related to todays visit.    Orders Placed This Encounter  Procedures  .  POCT HgB A1C  . PR PORTABLE  OXYGEN CONCENTRATOR    Meds ordered this encounter  Medications  . HYDROcodone-acetaminophen (NORCO/VICODIN) 5-325 MG tablet    Sig: Take 1 tablet by mouth 2 (two) times daily.    Dispense:  60 tablet    Refill:  0  . DISCONTD: HYDROcodone-acetaminophen (NORCO/VICODIN) 5-325 MG tablet    Sig: Take 1 tablet by mouth 2 (two) times daily.    Dispense:  60 tablet    Refill:  0    Do not fill before 05/22/2018  . DISCONTD: HYDROcodone-acetaminophen (NORCO/VICODIN) 5-325 MG tablet    Sig: Take 1 tablet by mouth 2 (two) times daily.    Dispense:  60 tablet    Refill:  0    Do not fill before 06/21/2018    Time spent: 25 Minutes   This patient was seen by Orson Gear AGNP-C in Collaboration with Dr [name] as a part of collaborative care agreement     [name] AGNP-C Internal medicine

## 2018-04-07 DIAGNOSIS — J449 Chronic obstructive pulmonary disease, unspecified: Secondary | ICD-10-CM | POA: Diagnosis not present

## 2018-04-09 DIAGNOSIS — J9610 Chronic respiratory failure, unspecified whether with hypoxia or hypercapnia: Secondary | ICD-10-CM | POA: Diagnosis not present

## 2018-04-09 DIAGNOSIS — R0902 Hypoxemia: Secondary | ICD-10-CM | POA: Diagnosis not present

## 2018-04-09 DIAGNOSIS — M7989 Other specified soft tissue disorders: Secondary | ICD-10-CM | POA: Diagnosis not present

## 2018-04-09 DIAGNOSIS — J449 Chronic obstructive pulmonary disease, unspecified: Secondary | ICD-10-CM | POA: Diagnosis not present

## 2018-04-09 DIAGNOSIS — G4733 Obstructive sleep apnea (adult) (pediatric): Secondary | ICD-10-CM | POA: Diagnosis not present

## 2018-04-10 ENCOUNTER — Emergency Department: Payer: Medicare Other

## 2018-04-10 ENCOUNTER — Telehealth: Payer: Self-pay | Admitting: Oncology

## 2018-04-10 ENCOUNTER — Encounter: Payer: Self-pay | Admitting: *Deleted

## 2018-04-10 ENCOUNTER — Encounter: Payer: Self-pay | Admitting: Emergency Medicine

## 2018-04-10 ENCOUNTER — Other Ambulatory Visit: Payer: Self-pay

## 2018-04-10 ENCOUNTER — Emergency Department
Admission: EM | Admit: 2018-04-10 | Discharge: 2018-04-10 | Disposition: A | Discharge status: Home / Self Care | Payer: Medicare Other | Attending: Emergency Medicine | Admitting: Emergency Medicine

## 2018-04-10 DIAGNOSIS — I110 Hypertensive heart disease with heart failure: Secondary | ICD-10-CM | POA: Diagnosis not present

## 2018-04-10 DIAGNOSIS — E1165 Type 2 diabetes mellitus with hyperglycemia: Secondary | ICD-10-CM | POA: Diagnosis not present

## 2018-04-10 DIAGNOSIS — N200 Calculus of kidney: Secondary | ICD-10-CM | POA: Diagnosis not present

## 2018-04-10 DIAGNOSIS — R1010 Upper abdominal pain, unspecified: Secondary | ICD-10-CM | POA: Diagnosis not present

## 2018-04-10 DIAGNOSIS — I509 Heart failure, unspecified: Secondary | ICD-10-CM | POA: Insufficient documentation

## 2018-04-10 DIAGNOSIS — Z87891 Personal history of nicotine dependence: Secondary | ICD-10-CM | POA: Insufficient documentation

## 2018-04-10 DIAGNOSIS — R918 Other nonspecific abnormal finding of lung field: Secondary | ICD-10-CM

## 2018-04-10 DIAGNOSIS — J439 Emphysema, unspecified: Secondary | ICD-10-CM | POA: Diagnosis not present

## 2018-04-10 DIAGNOSIS — R079 Chest pain, unspecified: Secondary | ICD-10-CM | POA: Diagnosis not present

## 2018-04-10 DIAGNOSIS — E039 Hypothyroidism, unspecified: Secondary | ICD-10-CM | POA: Diagnosis not present

## 2018-04-10 DIAGNOSIS — E279 Disorder of adrenal gland, unspecified: Secondary | ICD-10-CM | POA: Diagnosis not present

## 2018-04-10 DIAGNOSIS — J45909 Unspecified asthma, uncomplicated: Secondary | ICD-10-CM | POA: Diagnosis not present

## 2018-04-10 DIAGNOSIS — J984 Other disorders of lung: Secondary | ICD-10-CM | POA: Diagnosis not present

## 2018-04-10 DIAGNOSIS — I10 Essential (primary) hypertension: Secondary | ICD-10-CM | POA: Diagnosis not present

## 2018-04-10 DIAGNOSIS — R000 Tachycardia, unspecified: Secondary | ICD-10-CM | POA: Diagnosis not present

## 2018-04-10 DIAGNOSIS — R52 Pain, unspecified: Secondary | ICD-10-CM | POA: Diagnosis not present

## 2018-04-10 LAB — COMPREHENSIVE METABOLIC PANEL
ALT: 15 U/L (ref 0–44)
AST: 19 U/L (ref 15–41)
Albumin: 3.3 g/dL — ABNORMAL LOW (ref 3.5–5.0)
Alkaline Phosphatase: 76 U/L (ref 38–126)
Anion gap: 9 (ref 5–15)
BUN: 15 mg/dL (ref 8–23)
CHLORIDE: 99 mmol/L (ref 98–111)
CO2: 34 mmol/L — ABNORMAL HIGH (ref 22–32)
Calcium: 9.1 mg/dL (ref 8.9–10.3)
Creatinine, Ser: 0.68 mg/dL (ref 0.44–1.00)
GFR calc Af Amer: >60 mL/min (ref >60)
GFR calc non Af Amer: >60 mL/min (ref >60)
Glucose, Bld: 286 mg/dL — ABNORMAL HIGH (ref 70–99)
Potassium: 4.3 mmol/L (ref 3.5–5.1)
Sodium: 142 mmol/L (ref 135–145)
Total Bilirubin: 0.3 mg/dL (ref 0.3–1.2)
Total Protein: 6.4 g/dL — ABNORMAL LOW (ref 6.5–8.1)

## 2018-04-10 LAB — URINALYSIS, COMPLETE (UACMP) WITH MICROSCOPIC
BACTERIA UA: NONE SEEN
Bilirubin Urine: NEGATIVE
Glucose, UA: 150 mg/dL — AB
Hgb urine dipstick: NEGATIVE
Ketones, ur: NEGATIVE mg/dL
Leukocytes,Ua: NEGATIVE
NITRITE: NEGATIVE
Protein, ur: NEGATIVE mg/dL
Specific Gravity, Urine: 1.02 (ref 1.005–1.030)
pH: 5 (ref 5.0–8.0)

## 2018-04-10 LAB — TROPONIN I: Troponin I: <0.03 ng/mL (ref <0.03)

## 2018-04-10 LAB — CBC
HEMATOCRIT: 34.9 % — AB (ref 36.0–46.0)
Hemoglobin: 10 g/dL — ABNORMAL LOW (ref 12.0–15.0)
MCH: 29.4 pg (ref 26.0–34.0)
MCHC: 28.7 g/dL — ABNORMAL LOW (ref 30.0–36.0)
MCV: 102.6 fL — AB (ref 80.0–100.0)
Platelets: 224 10*3/uL (ref 150–400)
RBC: 3.4 MIL/uL — ABNORMAL LOW (ref 3.87–5.11)
RDW: 13.6 % (ref 11.5–15.5)
WBC: 7.1 10*3/uL (ref 4.0–10.5)
nRBC: 0 % (ref 0.0–0.2)

## 2018-04-10 LAB — BRAIN NATRIURETIC PEPTIDE: B Natriuretic Peptide: 49 pg/mL (ref 0.0–100.0)

## 2018-04-10 LAB — LIPASE, BLOOD: Lipase: 16 U/L (ref 11–51)

## 2018-04-10 MED ORDER — ACETAMINOPHEN 500 MG PO TABS
1000.0000 mg | ORAL_TABLET | Freq: Once | ORAL | Status: AC
  Administered 2018-04-10: 1000 mg via ORAL
  Filled 2018-04-10: qty 2

## 2018-04-10 MED ORDER — SODIUM CHLORIDE 0.9% FLUSH
3.0000 mL | Freq: Once | INTRAVENOUS | Status: AC
  Administered 2018-04-10: 3 mL via INTRAVENOUS

## 2018-04-10 MED ORDER — IOHEXOL 300 MG/ML  SOLN
100.0000 mL | Freq: Once | INTRAMUSCULAR | Status: AC | PRN
  Administered 2018-04-10: 100 mL via INTRAVENOUS

## 2018-04-10 NOTE — Telephone Encounter (Signed)
I received a call from Dr. Mariea Clonts today from the ER .Patient presented with some symptoms of chest pain and abdominal pain which led to CT chest abdomen and pelvis with contrast.  CT chest shows a spiculated mass in the left upper lobe measuring 3.5 x 2.1 x 1.1 cm.  There was also another peribronchial nodule about 11 mm in size.  Findings were concerning for primary lung cancer.  I would recommend getting a PET CT scan to a certain any hypermetabolism at the site which would also help US guide with biopsy.  We will schedule this accordingly and get in touch with the patient  Dr. Randa Evens, MD, MPH [facility] Pager- [phone] 04/10/2018 1:44 PM

## 2018-04-10 NOTE — ED Triage Notes (Signed)
Patient comes EMS for stated anxious, states upper abdominal pain for months, patient anxious.

## 2018-04-10 NOTE — ED Notes (Signed)
Serveral attempts at EKG unsuccessful. Patient moving in bed and hyperventilating. Best EKG obtained now.

## 2018-04-10 NOTE — Discharge Instructions (Addendum)
Today, there were 2 abnormal findings on your CT scan.  You do have a mass in the left lung and abnormal adrenal glands that are concerning for cancer and needs to be worked up by the oncology doctors.  They will call you to set up an appointment.  If you do not hear from their office within 24 hours, please call Dr. Elroy Channel office to schedule an appointment.  Return to the emergency department if you develop severe pain, lightheadedness or fainting, numbness tingling or weakness, palpitations, fever, or any other symptoms concerning to you.

## 2018-04-10 NOTE — Progress Notes (Signed)
  Oncology Nurse Navigator Documentation  Navigator Location: [facility] (04/10/18 1500) Referral date to RadOnc/MedOnc: 04/10/18 (04/10/18 1500) )Navigator Encounter Type: Introductory phone call (04/10/18 1500)   Abnormal Finding Date: 04/10/18 (04/10/18 1500)                   Treatment Phase: Abnormal Scans (04/10/18 1500) Barriers/Navigation Needs: Coordination of Care (04/10/18 1500)   Interventions: Coordination of Care (04/10/18 1500)   Coordination of Care: Appts;Radiology (04/10/18 1500)        Acuity: Level 3 (04/10/18 1500)     Acuity Level 3: Coordination of multimodality treatment;Transportation issues;Ongoing guidance and education provided throughout treatment (04/10/18 1500)  phone call made to patient to introduce to navigator services and review upcoming appts. Spoke with pt's son initially who requested that pt be scheduled for appts after 12pm so pt's sister can transport her. Appts rescheduled and called pt back. Pt's son did not answer. Voicemail left for pt to return call to review upcoming appts.  Time Spent with Patient: 30 (04/10/18 1500)

## 2018-04-10 NOTE — ED Provider Notes (Addendum)
[facility] Emergency Department Provider Note  ____________________________________________  Time seen: Approximately 10:43 AM  I have reviewed the triage vital signs and the nursing notes.   HISTORY  Chief Complaint Abdominal Pain    HPI Lynn Robbins is a 78 y.o. female a history of COPD on 1.5 L nasal cannula, CHF, HTN, DM, presenting for diffuse chest and upper abdominal pain.  The patient is a terrible historian, and her biggest concern today is "what happens when pubic hair falls out?"  From what I can ascertain, for the past month, she has had a chest pain below both breasts and in the upper abdomen.  She is unable to give me any information about the character of her pain, whether it is constant or comes and goes, or any other associated symptoms.  She denies any associated cough or cold symptoms, shortness of breath, lightheadedness or syncope, fevers or chills, nausea or vomiting, diarrhea.  Past Medical History:  Diagnosis Date  . CHF (congestive heart failure) ([place])   . COPD (chronic obstructive pulmonary disease) ([place])   . Depression   . Diabetes mellitus without complication ([place])   . Hypertension     Patient Active Problem List   Diagnosis Date Noted  . Intermittent asthma without complication 93/71/6967  . Chronic pain disorder 06/07/2017  . Hypertension 06/07/2017  . Mixed hyperlipidemia 02/03/2017  . Hypothyroidism 02/03/2017  . Type 2 diabetes mellitus with hyperglycemia ([place]) 02/03/2017  . Dysuria 02/03/2017    Past Surgical History:  Procedure Laterality Date  . CESAREAN SECTION      Current Outpatient Rx  . Order #: [id number] Class: Normal  . Order #: [id number] Class: Normal  . Order #: [id number] Class: Normal  . Order #: [id number] Class: Normal  . Order #: [id number] Class: Normal  . [START ON 04/21/2018] Order #: [id number] Class: Normal  . Order #: [id number] Class: Normal  . Order #: [id number] Class: Normal  . Order #:  [id number] Class: Normal  . Order #: [id number] Class: Historical Med  . Order #: [id number] Class: Historical Med  . Order #: [id number] Class: Normal  . Order #: [id number] Class: Normal  . Order #: [id number] Class: Normal    Allergies Patient has no known allergies.  Family History  Problem Relation Age of Onset  . Diabetes Father     Social History Social History   Tobacco Use  . Smoking status: Former Research scientist (life sciences)  . Smokeless tobacco: Never Used  Substance Use Topics  . Alcohol use: No  . Drug use: No    Review of Systems Constitutional: No fever/chills.  No lightheadedness or syncope. Eyes: No visual changes. ENT: No sore throat. No congestion or rhinorrhea. Cardiovascular: Positive chest pain. Denies palpitations. Respiratory: Denies shortness of breath.  No cough. Gastrointestinal: As of diffuse upper abdominal pain.  No nausea, no vomiting.  No diarrhea.  No constipation. Genitourinary: Negative for dysuria. Musculoskeletal: Negative for back pain. Skin: Negative for rash. Neurological: Negative for headaches. No focal numbness, tingling or weakness.  Psychiatric:Bizarre affect.  ____________________________________________   PHYSICAL EXAM:  VITAL SIGNS: ED Triage Vitals  Enc Vitals Group     BP 04/10/18 0922 (!) 154/99     Pulse Rate 04/10/18 0922 82     Resp 04/10/18 0922 20     Temp 04/10/18 0922 98.8 F (37.1 C)     Temp Source 04/10/18 0922 Oral     SpO2 04/10/18 0922 98 %     Weight 04/10/18 0930 187 lb (84.8 kg)     Height 04/10/18 0930 5'  1" (1.549 m)     Head Circumference --      Peak Flow --      Pain Score 04/10/18 1009 4     Pain Loc --      Pain Edu? --      Excl. in [place]? --     Constitutional: The patient is alert and is able to follow commands.  She answer some questions appropriately although she has some bizarre questions and answers and is a poor historian.  GCS is 15. Eyes: Conjunctivae are normal.  EOMI. No scleral icterus. Head:  Atraumatic. Nose: No congestion/rhinnorhea. Mouth/Throat: Mucous membranes are dry.  Neck: No stridor.  Supple.  No JVD.  No meningismus. Cardiovascular: Normal rate, regular rhythm. No murmurs, rubs or gallops.  Respiratory: Normal respiratory effort.  No accessory muscle use or retractions. Lungs CTAB.  No wheezes, rales or ronchi. Gastrointestinal: Soft, and nondistended.  Few tenderness in the entirety of the upper abdomen without focality; negative Murphy sign.  There is some fullness in the left upper quadrant but the patient is unwilling to lay on her back for my examination so the examination is limited.  No guarding or rebound.  No peritoneal signs. Musculoskeletal: No LE edema.  Neurologic:  A&Ox3.  Speech is clear.  Face and smile are symmetric.  EOMI.  Moves all extremities well. Skin:  Skin is warm, dry and intact. No rash noted. Psychiatric: Bizarre affect  ____________________________________________   LABS (all labs ordered are listed, but only abnormal results are displayed)  Labs Reviewed  COMPREHENSIVE METABOLIC PANEL - Abnormal; Notable for the following components:      Result Value   CO2 34 (*)    Glucose, Bld 286 (*)    Total Protein 6.4 (*)    Albumin 3.3 (*)    All other components within normal limits  CBC - Abnormal; Notable for the following components:   RBC 3.40 (*)    Hemoglobin 10.0 (*)    HCT 34.9 (*)    MCV 102.6 (*)    MCHC 28.7 (*)    All other components within normal limits  URINALYSIS, COMPLETE (UACMP) WITH MICROSCOPIC - Abnormal; Notable for the following components:   Color, Urine YELLOW (*)    APPearance CLEAR (*)    Glucose, UA 150 (*)    All other components within normal limits  LIPASE, BLOOD  TROPONIN I  BRAIN NATRIURETIC PEPTIDE  TROPONIN I   ____________________________________________  EKG  ED ECG REPORT I, Anne-Caroline Mariea Clonts, the attending physician, personally viewed and interpreted this ECG.   Date: 04/10/2018   EKG Time: 930  Rate: 82  Rhythm: normal sinus rhythm  Axis: normal  Intervals:none  ST&T Change: No STEMI  ____________________________________________  RADIOLOGY  Dg Chest 1 View  Result Date: 04/10/2018 CLINICAL DATA:  Chest pain EXAM: CHEST  1 VIEW COMPARISON:  None. FINDINGS: COPD with pulmonary hyperinflation. Left upper lobe mass lesion approximately 2 x 3 cm. Enlargement right hilum. This could be adenopathy or vascular enlargement. Heart size normal. Negative for heart failure. No pleural effusion. Right paratracheal soft tissue density with mild displacement of the trachea may represent goiter or vascular ectasia. Atherosclerotic aortic arch. IMPRESSION: COPD Left upper lobe mass possible carcinoma. Recommend CT chest with contrast. Enlargement of the right hilum which may be adenopathy or enlarged pulmonary artery. This can be evaluated at CT chest with contrast. Electronically Signed   By: Franchot Gallo M.D.   On: 04/10/2018 10:00   Ct  Chest W Contrast  Result Date: 04/10/2018 CLINICAL DATA:  Chest and upper abdominal pain. Personal history of COPD, congestive heart failure, hypertension, and diabetes. EXAM: CT CHEST, ABDOMEN, AND PELVIS WITH CONTRAST TECHNIQUE: Multidetector CT imaging of the chest, abdomen and pelvis was performed following the standard protocol during bolus administration of intravenous contrast. CONTRAST:  154mL OMNIPAQUE IOHEXOL 300 MG/ML  SOLN COMPARISON:  None. FINDINGS: CT CHEST FINDINGS Cardiovascular: Heart size is normal. Calcifications are present at the aortic valve. Additional calcifications are present at the aortic arch. There is no aneurysm. No significant stenosis is present at the origins the great vessels. Pulmonary arteries are enlarged bilaterally measuring up to 3.6 cm on the right and 2.9 cm on the left. No definite pulmonary emboli are present on this study. Of note, the study was not optimized for evaluation of pulmonary emboli.  Mediastinum/Nodes: No significant mediastinal, hilar, or axillary adenopathy is present. Subcentimeter left hilar lymph nodes are present. Lungs/Pleura: A spiculated mass in the left upper lobe measures 2.1 x 3.5 x 1.9 cm. A spiculated peribronchial nodule present in the right upper lobe measuring 11 x 6 x 5 mm. There is some ground-glass attenuation posteriorly in the right upper lobe. A 3 mm nodule is present on image 45 of series 4. There is some scarring in the right middle lobe. No other nodules are present. Centrilobular emphysematous changes are noted Musculoskeletal: Vertebral body heights alignment are maintained. A hemangioma is present posteriorly at T7. No other focal lytic or blastic lesions are present. Mild endplate changes are present at T3-4. CT ABDOMEN PELVIS FINDINGS Hepatobiliary: No focal liver abnormality is seen. No gallstones, gallbladder wall thickening, or biliary dilatation. Pancreas: There is chronic obstruction of the pancreatic duct. The duct is dilated. The pancreas is markedly atrophic. A pseudocyst is present distally. Spleen: Normal in size without focal abnormality. Adrenals/Urinary Tract: There is some fullness of the left adrenal gland without a discrete lesion. Subcentimeter cysts are present in the left kidney. A nonobstructing 8 mm stone is present at the lower pole of the right kidney and a second stone in the right kidney measures up to 5 mm. No significant nephrolithiasis is present on the left. Ureters are within normal limits. The urinary bladder is normal. Stomach/Bowel: Stomach is normal. A large duodenal diverticulum is present without associated inflammatory change. Small bowel is within normal limits. The ascending and transverse colon are within normal limits. Diverticular changes are present in the sigmoid colon. There is no associated inflammatory change. Vascular/Lymphatic: No significant retroperitoneal adenopathy is present. Atherosclerotic changes are present  in the aorta and branch vessels without aneurysm. Reproductive: Uterus and bilateral adnexa are unremarkable. Other: No abdominal wall hernia or abnormality. No abdominopelvic ascites. Musculoskeletal: There is fusion across the disc space at L4-5 and L5-S1. Advanced degenerative disc disease is present at L1-2, L2-3, and L3-4. IMPRESSION: 1. Left upper lobe spiculated mass highly concerning for a primary bronchogenic neoplasm. Recommend whole-body PET scan. 2. Additional nodule in the right upper lobe may represent a synchronous tumor. 3. No significant mediastinal adenopathy. 4.  Emphysema (ICD10-J43.9). 5. Fullness of the adrenal glands bilaterally without a definite mass lesion. Metastatic disease could not be excluded. PET scan will be helpful for further evaluation. 6.  Aortic Atherosclerosis (ICD10-I70.0). 7. 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 8. Bilateral nonobstructive nephrolithiasis. 9. Multilevel degenerative changes of the thoracolumbar spine. Electronically Signed   By: San Morelle M.D.   On:  04/10/2018 11:51   Ct Abdomen Pelvis W Contrast  Result Date: 04/10/2018 CLINICAL DATA:  Chest and upper abdominal pain. Personal history of COPD, congestive heart failure, hypertension, and diabetes. EXAM: CT CHEST, ABDOMEN, AND PELVIS WITH CONTRAST TECHNIQUE: Multidetector CT imaging of the chest, abdomen and pelvis was performed following the standard protocol during bolus administration of intravenous contrast. CONTRAST:  153mL OMNIPAQUE IOHEXOL 300 MG/ML  SOLN COMPARISON:  None. FINDINGS: CT CHEST FINDINGS Cardiovascular: Heart size is normal. Calcifications are present at the aortic valve. Additional calcifications are present at the aortic arch. There is no aneurysm. No significant stenosis is present at the origins the great vessels. Pulmonary arteries are enlarged bilaterally measuring up to 3.6 cm on the right and 2.9 cm on  the left. No definite pulmonary emboli are present on this study. Of note, the study was not optimized for evaluation of pulmonary emboli. Mediastinum/Nodes: No significant mediastinal, hilar, or axillary adenopathy is present. Subcentimeter left hilar lymph nodes are present. Lungs/Pleura: A spiculated mass in the left upper lobe measures 2.1 x 3.5 x 1.9 cm. A spiculated peribronchial nodule present in the right upper lobe measuring 11 x 6 x 5 mm. There is some ground-glass attenuation posteriorly in the right upper lobe. A 3 mm nodule is present on image 45 of series 4. There is some scarring in the right middle lobe. No other nodules are present. Centrilobular emphysematous changes are noted Musculoskeletal: Vertebral body heights alignment are maintained. A hemangioma is present posteriorly at T7. No other focal lytic or blastic lesions are present. Mild endplate changes are present at T3-4. CT ABDOMEN PELVIS FINDINGS Hepatobiliary: No focal liver abnormality is seen. No gallstones, gallbladder wall thickening, or biliary dilatation. Pancreas: There is chronic obstruction of the pancreatic duct. The duct is dilated. The pancreas is markedly atrophic. A pseudocyst is present distally. Spleen: Normal in size without focal abnormality. Adrenals/Urinary Tract: There is some fullness of the left adrenal gland without a discrete lesion. Subcentimeter cysts are present in the left kidney. A nonobstructing 8 mm stone is present at the lower pole of the right kidney and a second stone in the right kidney measures up to 5 mm. No significant nephrolithiasis is present on the left. Ureters are within normal limits. The urinary bladder is normal. Stomach/Bowel: Stomach is normal. A large duodenal diverticulum is present without associated inflammatory change. Small bowel is within normal limits. The ascending and transverse colon are within normal limits. Diverticular changes are present in the sigmoid colon. There is no  associated inflammatory change. Vascular/Lymphatic: No significant retroperitoneal adenopathy is present. Atherosclerotic changes are present in the aorta and branch vessels without aneurysm. Reproductive: Uterus and bilateral adnexa are unremarkable. Other: No abdominal wall hernia or abnormality. No abdominopelvic ascites. Musculoskeletal: There is fusion across the disc space at L4-5 and L5-S1. Advanced degenerative disc disease is present at L1-2, L2-3, and L3-4. IMPRESSION: 1. Left upper lobe spiculated mass highly concerning for a primary bronchogenic neoplasm. Recommend whole-body PET scan. 2. Additional nodule in the right upper lobe may represent a synchronous tumor. 3. No significant mediastinal adenopathy. 4.  Emphysema (ICD10-J43.9). 5. Fullness of the adrenal glands bilaterally without a definite mass lesion. Metastatic disease could not be excluded. PET scan will be helpful for further evaluation. 6.  Aortic Atherosclerosis (ICD10-I70.0). 7. 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 8. Bilateral nonobstructive nephrolithiasis. 9. Multilevel degenerative changes of the thoracolumbar spine. Electronically Signed   By: Harrell Gave  Mattern M.D.   On: 04/10/2018 11:51    ____________________________________________   PROCEDURES  Procedure(s) performed: None  Procedures  Critical Care performed: No ____________________________________________   INITIAL IMPRESSION / ASSESSMENT AND PLAN / ED COURSE  Pertinent labs & imaging results that were available during my care of the patient were reviewed by me and considered in my medical decision making (see chart for details).  78 y.o. female with a history of multiple chronic comorbidities presenting with upper abdominal and lower chest pain diffusely.  Overall, the patient is hemodynamically stable.  On her supplemental oxygen, she is satting 98%.  Denies any ischemic changes on her EKG  we will get 2 troponins to evaluate for ACS or MI.  CT of the chest and abdomen have been ordered; the patient did have a left upper lobe mass on her chest x-ray which is concerning for malignancy.  Plan reevaluation for final disposition.  ----------------------------------------- 1:37 PM on 04/10/2018 -----------------------------------------  Patient's work-up in the emergency department does not show any emergent findings.  Her EKG is reassuring and she has 2 troponins which are negative.  The CT of the abdomen and chest do not show any acute processes.  However, she does have 2 abnormal findings which are concerning for malignancy; spiculated mass in the left upper lobe of the lung and full adrenal glands.  I have spoken with Dr. Janese Banks, the oncologist on-call, and they will call her for an outpatient appointment.  I have discussed these findings with the patient and her family, and they understand the plan.  At this time, the patient will be discharged with follow-up instructions and return precautions.  ____________________________________________  FINAL CLINICAL IMPRESSION(S) / ED DIAGNOSES  Final diagnoses:  Mass of upper lobe of left lung  Chest pain, unspecified type  Upper abdominal pain  Adrenal abnormality ([place])         NEW MEDICATIONS STARTED DURING THIS VISIT:  New Prescriptions   No medications on file      Eula Listen, MD 04/10/18 1049    Eula Listen, MD 04/10/18 [id number]

## 2018-04-10 NOTE — Progress Notes (Signed)
Appts have been reviewed with pt's son. He verbalized understanding. Instructed to call back if has any further questions. Nothing further needed at this time.

## 2018-04-10 NOTE — ED Notes (Signed)
Esign not working at this time. Pt and family verbalized discharge instructions and has no questions at this time

## 2018-04-12 ENCOUNTER — Other Ambulatory Visit: Payer: Self-pay | Admitting: Adult Health

## 2018-04-12 DIAGNOSIS — F411 Generalized anxiety disorder: Secondary | ICD-10-CM

## 2018-04-12 NOTE — Telephone Encounter (Signed)
LAST VISIT ON 04/03/2018 NEEDS TO BE FILLED. THANKS.

## 2018-04-13 ENCOUNTER — Other Ambulatory Visit: Payer: Self-pay

## 2018-04-13 ENCOUNTER — Telehealth: Payer: Self-pay

## 2018-04-13 NOTE — Telephone Encounter (Signed)
Pt son advised pres send to her phar

## 2018-04-14 ENCOUNTER — Ambulatory Visit
Admission: RE | Admit: 2018-04-14 | Discharge: 2018-04-14 | Disposition: A | Discharge status: Home / Self Care | Payer: Medicare Other | Source: Ambulatory Visit | Attending: Oncology | Admitting: Oncology

## 2018-04-14 ENCOUNTER — Inpatient Hospital Stay: Payer: Medicare Other | Admitting: Oncology

## 2018-04-14 DIAGNOSIS — D3502 Benign neoplasm of left adrenal gland: Secondary | ICD-10-CM | POA: Insufficient documentation

## 2018-04-14 DIAGNOSIS — J432 Centrilobular emphysema: Secondary | ICD-10-CM | POA: Diagnosis not present

## 2018-04-14 DIAGNOSIS — D3501 Benign neoplasm of right adrenal gland: Secondary | ICD-10-CM | POA: Insufficient documentation

## 2018-04-14 DIAGNOSIS — R918 Other nonspecific abnormal finding of lung field: Secondary | ICD-10-CM | POA: Diagnosis not present

## 2018-04-14 DIAGNOSIS — K861 Other chronic pancreatitis: Secondary | ICD-10-CM | POA: Diagnosis not present

## 2018-04-14 DIAGNOSIS — C3412 Malignant neoplasm of upper lobe, left bronchus or lung: Secondary | ICD-10-CM | POA: Insufficient documentation

## 2018-04-14 LAB — GLUCOSE, CAPILLARY: Glucose-Capillary: 114 mg/dL — ABNORMAL HIGH (ref 70–99)

## 2018-04-14 MED ORDER — FLUDEOXYGLUCOSE F - 18 (FDG) INJECTION
10.5700 | Freq: Once | INTRAVENOUS | Status: AC | PRN
  Administered 2018-04-14: 10.57 via INTRAVENOUS

## 2018-04-17 ENCOUNTER — Inpatient Hospital Stay: Payer: Medicare Other | Attending: Oncology | Admitting: Oncology

## 2018-04-17 ENCOUNTER — Encounter: Payer: Self-pay | Admitting: Oncology

## 2018-04-17 ENCOUNTER — Other Ambulatory Visit: Payer: Self-pay

## 2018-04-17 VITALS — BP 159/92 | HR 85 | Temp 98.1°F | Resp 20 | Ht 60.63 in | Wt 188.0 lb

## 2018-04-17 DIAGNOSIS — R0609 Other forms of dyspnea: Secondary | ICD-10-CM

## 2018-04-17 DIAGNOSIS — G8929 Other chronic pain: Secondary | ICD-10-CM | POA: Diagnosis not present

## 2018-04-17 DIAGNOSIS — Z720 Tobacco use: Secondary | ICD-10-CM | POA: Insufficient documentation

## 2018-04-17 DIAGNOSIS — Z801 Family history of malignant neoplasm of trachea, bronchus and lung: Secondary | ICD-10-CM | POA: Insufficient documentation

## 2018-04-17 DIAGNOSIS — Z803 Family history of malignant neoplasm of breast: Secondary | ICD-10-CM

## 2018-04-17 DIAGNOSIS — M545 Low back pain: Secondary | ICD-10-CM | POA: Diagnosis not present

## 2018-04-17 DIAGNOSIS — I509 Heart failure, unspecified: Secondary | ICD-10-CM | POA: Insufficient documentation

## 2018-04-17 DIAGNOSIS — J449 Chronic obstructive pulmonary disease, unspecified: Secondary | ICD-10-CM | POA: Insufficient documentation

## 2018-04-17 DIAGNOSIS — Z9981 Dependence on supplemental oxygen: Secondary | ICD-10-CM | POA: Diagnosis not present

## 2018-04-17 DIAGNOSIS — Z808 Family history of malignant neoplasm of other organs or systems: Secondary | ICD-10-CM | POA: Diagnosis not present

## 2018-04-17 DIAGNOSIS — R918 Other nonspecific abnormal finding of lung field: Secondary | ICD-10-CM

## 2018-04-17 DIAGNOSIS — E669 Obesity, unspecified: Secondary | ICD-10-CM | POA: Insufficient documentation

## 2018-04-17 DIAGNOSIS — R5383 Other fatigue: Secondary | ICD-10-CM

## 2018-04-17 DIAGNOSIS — I110 Hypertensive heart disease with heart failure: Secondary | ICD-10-CM | POA: Insufficient documentation

## 2018-04-17 DIAGNOSIS — Z809 Family history of malignant neoplasm, unspecified: Secondary | ICD-10-CM | POA: Insufficient documentation

## 2018-04-17 DIAGNOSIS — Z7189 Other specified counseling: Secondary | ICD-10-CM

## 2018-04-17 NOTE — Progress Notes (Signed)
Pt here for new pt eval. On 2.L of 02 24/7 Pulse oximetry on room air is 97%

## 2018-04-18 ENCOUNTER — Encounter: Payer: Self-pay | Admitting: *Deleted

## 2018-04-18 ENCOUNTER — Encounter: Payer: Self-pay | Admitting: Oncology

## 2018-04-18 DIAGNOSIS — R918 Other nonspecific abnormal finding of lung field: Secondary | ICD-10-CM | POA: Insufficient documentation

## 2018-04-18 DIAGNOSIS — Z7189 Other specified counseling: Secondary | ICD-10-CM | POA: Insufficient documentation

## 2018-04-18 NOTE — Progress Notes (Signed)
  Oncology Nurse Navigator Documentation  Navigator Location: [facility]Med Onc (04/18/18 0900)   )Navigator Encounter Type: Initial MedOnc (04/18/18 0900)                         Barriers/Navigation Needs: Coordination of Care (04/18/18 0900)   Interventions: Coordination of Care (04/18/18 0900)   Coordination of Care: Appts (04/18/18 0900)           met with patient and her family during initial med-onc consultation with Dr. Janese Banks. PET scan results reviewed with patient. All questions answered during visit. Pt and family informed that will be given a call Thursday afternoon or Friday with recommendations for tumor conference. Contact info given and instructed to call with any further questions or needs. Pt and family verbalized understanding.       Time Spent with Patient: 60 (04/18/18 0900)

## 2018-04-18 NOTE — Progress Notes (Signed)
Hematology/Oncology Consult note [facility] Telephone:([phone] [phone]  Patient Care Team: [name], MD as PCP - General (Internal Medicine) Telford Nab, RN as Registered Nurse   Name of the patient: Lynn Robbins  [id number]  Aug 27, 1940    Reason for referral-spiculated lung mass   Referring physician-Dr. Mariea Clonts  Date of visit: 04/18/18   History of presenting illness-patient is a 78 year old female with a past medical history significant for COPD for which she is on continuous oxygen 2 L, hypertension, CHF who presented to the ER on 04/10/2018 with some symptoms of chest wall pain which led to CT chest and CT abdomen.  CT scan showed left upper lobe spiculated mass highly concerning for primary lung cancer.  There was also a second nodule in the right upper lobe which may represent synchronous tumor.  No significant mediastinal adenopathy.  This was followed by a PET CT scan on 04/14/2018 which showed hypermetabolic left upper lobe lung mass 3.5 x 2.3 cm with an SUV of 13.5.  Second nodule in the right upper lobe measuring 14 mm which did not show any hypermetabolism.  No hypermetabolic mediastinal lymph nodes.  Patient is here to discuss further management.  She reports feeling short of breath with minimal exertion.  She sees Dr. Clayborn Bigness and also has Dr. Bea Laura who is her pulmonologist but reports that she has not seen him in a long time.  She reports feeling fatigued and has chronic back pain.  Multiple family members have had lung cancer.  She smokes about 3 packs of cigarettes per day and has been doing so over 50 years.  ECOG PS- 2  Pain scale- 9   Review of systems- Review of Systems  Constitutional: Positive for malaise/fatigue. Negative for chills, fever and weight loss.  HENT: Negative for congestion, ear discharge and nosebleeds.   Eyes: Negative for blurred vision.  Respiratory: Positive for shortness of breath.  Negative for cough, hemoptysis, sputum production and wheezing.   Cardiovascular: Negative for chest pain, palpitations, orthopnea and claudication.  Gastrointestinal: Negative for abdominal pain, blood in stool, constipation, diarrhea, heartburn, melena, nausea and vomiting.  Genitourinary: Negative for dysuria, flank pain, frequency, hematuria and urgency.  Musculoskeletal: Negative for back pain, joint pain and myalgias.  Skin: Negative for rash.  Neurological: Negative for dizziness, tingling, focal weakness, seizures, weakness and headaches.  Endo/Heme/Allergies: Does not bruise/bleed easily.  Psychiatric/Behavioral: Negative for depression and suicidal ideas. The patient does not have insomnia.     No Known Allergies  Patient Active Problem List   Diagnosis Date Noted  . Intermittent asthma without complication 56/31/4970  . Chronic pain disorder 06/07/2017  . Hypertension 06/07/2017  . Mixed hyperlipidemia 02/03/2017  . Hypothyroidism 02/03/2017  . Type 2 diabetes mellitus with hyperglycemia ([place]) 02/03/2017  . Dysuria 02/03/2017     Past Medical History:  Diagnosis Date  . CHF (congestive heart failure) ([place])   . COPD (chronic obstructive pulmonary disease) ([place])   . Depression   . Diabetes mellitus without complication ([place])   . HOH (hard of hearing)   . Hypertension      Past Surgical History:  Procedure Laterality Date  . CESAREAN SECTION      Social History   Socioeconomic History  . Marital status: Single    Spouse name: Not on file  . Number of children: 5  . Years of education: Not on file  . Highest education level: Not on file  Occupational History  .  Not on file  Social Needs  . Financial resource strain: Not on file  . Food insecurity:    Worry: Not on file    Inability: Not on file  . Transportation needs:    Medical: Not on file    Non-medical: Not on file  Tobacco Use  . Smoking status: Former Smoker    Packs/day: 3.00    Years:  57.00    Pack years: 171.00    Types: Cigarettes    Last attempt to quit: 04/17/1998    Years since quitting: 20.0  . Smokeless tobacco: Current User    Types: Snuff  Substance and Sexual Activity  . Alcohol use: No  . Drug use: No  . Sexual activity: Never  Lifestyle  . Physical activity:    Days per week: Not on file    Minutes per session: Not on file  . Stress: Not on file  Relationships  . Social connections:    Talks on phone: Not on file    Gets together: Not on file    Attends religious service: Not on file    Active member of club or organization: Not on file    Attends meetings of clubs or organizations: Not on file    Relationship status: Not on file  . Intimate partner violence:    Fear of current or ex partner: Not on file    Emotionally abused: Not on file    Physically abused: Not on file    Forced sexual activity: Not on file  Other Topics Concern  . Not on file  Social History Narrative   ** Merged History Encounter **         Family History  Problem Relation Age of Onset  . Diabetes Father   . Colon cancer Father   . Colon cancer Mother   . Aneurysm Mother 24       brain  . Lung cancer Sister   . Breast cancer Sister   . Lung cancer Brother   . Lung cancer Sister   . Bone cancer Brother   . Lung cancer Brother   . Cancer Sister        type unknown     Current Outpatient Medications:  .  ACCU-CHEK FASTCLIX LANCETS MISC, Use as directed to check sugars. DX E11.65, Disp: 102 each, Rfl: 2 .  atenolol (TENORMIN) 25 MG tablet, Take 1 tablet (25 mg total) by mouth daily., Disp: 90 tablet, Rfl: 2 .  clonazePAM (KLONOPIN) 1 MG tablet, TAKE 1 TABLET (1 MG TOTAL) BY MOUTH 2 (TWO) TIMES DAILY AS NEEDED FOR ANXIETY., Disp: 60 tablet, Rfl: 0 .  glimepiride (AMARYL) 2 MG tablet, Take 1 tablet (2 mg total) by mouth daily with breakfast., Disp: 90 tablet, Rfl: 3 .  HYDROcodone-acetaminophen (NORCO/VICODIN) 5-325 MG tablet, Take 1 tablet by mouth 2 (two) times  daily., Disp: 60 tablet, Rfl: 0 .  [START ON 04/21/2018] HYDROcodone-acetaminophen (NORCO/VICODIN) 5-325 MG tablet, Take 1 tablet by mouth 2 (two) times daily., Disp: 60 tablet, Rfl: 0 .  losartan-hydrochlorothiazide (HYZAAR) 50-12.5 MG tablet, Take 1 tablet by mouth daily., Disp: 90 tablet, Rfl: 2 .  naproxen (NAPROSYN) 500 MG tablet, Take 1 tablet by mouth 2 (two) times daily., Disp: , Rfl: 1 .  OXYGEN, Inhale into the lungs., Disp: , Rfl:  .  PROAIR HFA 108 (90 Base) MCG/ACT inhaler, 2 PUFFS EVERY 4 HOURS AS NEEDED, Disp: 25.5 Inhaler, Rfl: 4 .  SitaGLIPtin-MetFORMIN HCl (JANUMET XR) 50-500 MG  TB24, TAKE ONE TAB A DAY WITH FOOD FOR DIABETES, Disp: 90 tablet, Rfl: 2 .  SPIRIVA HANDIHALER 18 MCG inhalation capsule, INHALE 1 CAPSULE VIA HANDIHALER ONCE DAILY AT THE SAME TIME EVERY DAY, Disp: 90 capsule, Rfl: 3   Physical exam:  Vitals:   04/17/18 1501  BP: (!) 159/92  Pulse: 85  Resp: 20  Temp: 98.1 F (36.7 C)  TempSrc: Tympanic  Weight: 188 lb (85.3 kg)  Height: 5' 0.63" (1.54 m)   Physical Exam Constitutional:      Comments: Patient is obese.  She is sitting in a wheelchair and appears in mild distress due to shortness of breath.  She is on continuous oxygen  HENT:     Head: Normocephalic and atraumatic.  Eyes:     Pupils: Pupils are equal, round, and reactive to light.  Neck:     Musculoskeletal: Normal range of motion.  Cardiovascular:     Rate and Rhythm: Normal rate and regular rhythm.     Heart sounds: Normal heart sounds.  Pulmonary:     Comments: Effort increased.  Breath sounds are decreased bilaterally diffusely Abdominal:     General: Bowel sounds are normal.     Palpations: Abdomen is soft.  Skin:    General: Skin is warm and dry.  Neurological:     Mental Status: She is alert and oriented to person, place, and time.        CMP Latest Ref Rng & Units 04/10/2018  Glucose 70 - 99 mg/dL 286(H)  BUN 8 - 23 mg/dL 15  Creatinine 0.44 - 1.00 mg/dL 0.68  Sodium  135 - 145 mmol/L 142  Potassium 3.5 - 5.1 mmol/L 4.3  Chloride 98 - 111 mmol/L 99  CO2 22 - 32 mmol/L 34(H)  Calcium 8.9 - 10.3 mg/dL 9.1  Total Protein 6.5 - 8.1 g/dL 6.4(L)  Total Bilirubin 0.3 - 1.2 mg/dL 0.3  Alkaline Phos 38 - 126 U/L 76  AST 15 - 41 U/L 19  ALT 0 - 44 U/L 15   CBC Latest Ref Rng & Units 04/10/2018  WBC 4.0 - 10.5 K/uL 7.1  Hemoglobin 12.0 - 15.0 g/dL 10.0(L)  Hematocrit 36.0 - 46.0 % 34.9(L)  Platelets 150 - 400 K/uL 224    No images are attached to the encounter.  Dg Chest 1 View  Result Date: 04/10/2018 CLINICAL DATA:  Chest pain EXAM: CHEST  1 VIEW COMPARISON:  None. FINDINGS: COPD with pulmonary hyperinflation. Left upper lobe mass lesion approximately 2 x 3 cm. Enlargement right hilum. This could be adenopathy or vascular enlargement. Heart size normal. Negative for heart failure. No pleural effusion. Right paratracheal soft tissue density with mild displacement of the trachea may represent goiter or vascular ectasia. Atherosclerotic aortic arch. IMPRESSION: COPD Left upper lobe mass possible carcinoma. Recommend CT chest with contrast. Enlargement of the right hilum which may be adenopathy or enlarged pulmonary artery. This can be evaluated at CT chest with contrast. Electronically Signed   By: Franchot Gallo M.D.   On: 04/10/2018 10:00   Ct Chest W Contrast  Result Date: 04/10/2018 CLINICAL DATA:  Chest and upper abdominal pain. Personal history of COPD, congestive heart failure, hypertension, and diabetes. EXAM: CT CHEST, ABDOMEN, AND PELVIS WITH CONTRAST TECHNIQUE: Multidetector CT imaging of the chest, abdomen and pelvis was performed following the standard protocol during bolus administration of intravenous contrast. CONTRAST:  172mL OMNIPAQUE IOHEXOL 300 MG/ML  SOLN COMPARISON:  None. FINDINGS: CT CHEST FINDINGS Cardiovascular: Heart size  is normal. Calcifications are present at the aortic valve. Additional calcifications are present at the aortic arch.  There is no aneurysm. No significant stenosis is present at the origins the great vessels. Pulmonary arteries are enlarged bilaterally measuring up to 3.6 cm on the right and 2.9 cm on the left. No definite pulmonary emboli are present on this study. Of note, the study was not optimized for evaluation of pulmonary emboli. Mediastinum/Nodes: No significant mediastinal, hilar, or axillary adenopathy is present. Subcentimeter left hilar lymph nodes are present. Lungs/Pleura: A spiculated mass in the left upper lobe measures 2.1 x 3.5 x 1.9 cm. A spiculated peribronchial nodule present in the right upper lobe measuring 11 x 6 x 5 mm. There is some ground-glass attenuation posteriorly in the right upper lobe. A 3 mm nodule is present on image 45 of series 4. There is some scarring in the right middle lobe. No other nodules are present. Centrilobular emphysematous changes are noted Musculoskeletal: Vertebral body heights alignment are maintained. A hemangioma is present posteriorly at T7. No other focal lytic or blastic lesions are present. Mild endplate changes are present at T3-4. CT ABDOMEN PELVIS FINDINGS Hepatobiliary: No focal liver abnormality is seen. No gallstones, gallbladder wall thickening, or biliary dilatation. Pancreas: There is chronic obstruction of the pancreatic duct. The duct is dilated. The pancreas is markedly atrophic. A pseudocyst is present distally. Spleen: Normal in size without focal abnormality. Adrenals/Urinary Tract: There is some fullness of the left adrenal gland without a discrete lesion. Subcentimeter cysts are present in the left kidney. A nonobstructing 8 mm stone is present at the lower pole of the right kidney and a second stone in the right kidney measures up to 5 mm. No significant nephrolithiasis is present on the left. Ureters are within normal limits. The urinary bladder is normal. Stomach/Bowel: Stomach is normal. A large duodenal diverticulum is present without associated  inflammatory change. Small bowel is within normal limits. The ascending and transverse colon are within normal limits. Diverticular changes are present in the sigmoid colon. There is no associated inflammatory change. Vascular/Lymphatic: No significant retroperitoneal adenopathy is present. Atherosclerotic changes are present in the aorta and branch vessels without aneurysm. Reproductive: Uterus and bilateral adnexa are unremarkable. Other: No abdominal wall hernia or abnormality. No abdominopelvic ascites. Musculoskeletal: There is fusion across the disc space at L4-5 and L5-S1. Advanced degenerative disc disease is present at L1-2, L2-3, and L3-4. IMPRESSION: 1. Left upper lobe spiculated mass highly concerning for a primary bronchogenic neoplasm. Recommend whole-body PET scan. 2. Additional nodule in the right upper lobe may represent a synchronous tumor. 3. No significant mediastinal adenopathy. 4.  Emphysema (ICD10-J43.9). 5. Fullness of the adrenal glands bilaterally without a definite mass lesion. Metastatic disease could not be excluded. PET scan will be helpful for further evaluation. 6.  Aortic Atherosclerosis (ICD10-I70.0). 7. 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 8. Bilateral nonobstructive nephrolithiasis. 9. Multilevel degenerative changes of the thoracolumbar spine. Electronically Signed   By: San Morelle M.D.   On: 04/10/2018 11:51   Ct Abdomen Pelvis W Contrast  Result Date: 04/10/2018 CLINICAL DATA:  Chest and upper abdominal pain. Personal history of COPD, congestive heart failure, hypertension, and diabetes. EXAM: CT CHEST, ABDOMEN, AND PELVIS WITH CONTRAST TECHNIQUE: Multidetector CT imaging of the chest, abdomen and pelvis was performed following the standard protocol during bolus administration of intravenous contrast. CONTRAST:  134mL OMNIPAQUE IOHEXOL 300 MG/ML  SOLN COMPARISON:  None. FINDINGS:  CT CHEST FINDINGS  Cardiovascular: Heart size is normal. Calcifications are present at the aortic valve. Additional calcifications are present at the aortic arch. There is no aneurysm. No significant stenosis is present at the origins the great vessels. Pulmonary arteries are enlarged bilaterally measuring up to 3.6 cm on the right and 2.9 cm on the left. No definite pulmonary emboli are present on this study. Of note, the study was not optimized for evaluation of pulmonary emboli. Mediastinum/Nodes: No significant mediastinal, hilar, or axillary adenopathy is present. Subcentimeter left hilar lymph nodes are present. Lungs/Pleura: A spiculated mass in the left upper lobe measures 2.1 x 3.5 x 1.9 cm. A spiculated peribronchial nodule present in the right upper lobe measuring 11 x 6 x 5 mm. There is some ground-glass attenuation posteriorly in the right upper lobe. A 3 mm nodule is present on image 45 of series 4. There is some scarring in the right middle lobe. No other nodules are present. Centrilobular emphysematous changes are noted Musculoskeletal: Vertebral body heights alignment are maintained. A hemangioma is present posteriorly at T7. No other focal lytic or blastic lesions are present. Mild endplate changes are present at T3-4. CT ABDOMEN PELVIS FINDINGS Hepatobiliary: No focal liver abnormality is seen. No gallstones, gallbladder wall thickening, or biliary dilatation. Pancreas: There is chronic obstruction of the pancreatic duct. The duct is dilated. The pancreas is markedly atrophic. A pseudocyst is present distally. Spleen: Normal in size without focal abnormality. Adrenals/Urinary Tract: There is some fullness of the left adrenal gland without a discrete lesion. Subcentimeter cysts are present in the left kidney. A nonobstructing 8 mm stone is present at the lower pole of the right kidney and a second stone in the right kidney measures up to 5 mm. No significant nephrolithiasis is present on the left. Ureters are within  normal limits. The urinary bladder is normal. Stomach/Bowel: Stomach is normal. A large duodenal diverticulum is present without associated inflammatory change. Small bowel is within normal limits. The ascending and transverse colon are within normal limits. Diverticular changes are present in the sigmoid colon. There is no associated inflammatory change. Vascular/Lymphatic: No significant retroperitoneal adenopathy is present. Atherosclerotic changes are present in the aorta and branch vessels without aneurysm. Reproductive: Uterus and bilateral adnexa are unremarkable. Other: No abdominal wall hernia or abnormality. No abdominopelvic ascites. Musculoskeletal: There is fusion across the disc space at L4-5 and L5-S1. Advanced degenerative disc disease is present at L1-2, L2-3, and L3-4. IMPRESSION: 1. Left upper lobe spiculated mass highly concerning for a primary bronchogenic neoplasm. Recommend whole-body PET scan. 2. Additional nodule in the right upper lobe may represent a synchronous tumor. 3. No significant mediastinal adenopathy. 4.  Emphysema (ICD10-J43.9). 5. Fullness of the adrenal glands bilaterally without a definite mass lesion. Metastatic disease could not be excluded. PET scan will be helpful for further evaluation. 6.  Aortic Atherosclerosis (ICD10-I70.0). 7. 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 8. Bilateral nonobstructive nephrolithiasis. 9. Multilevel degenerative changes of the thoracolumbar spine. Electronically Signed   By: San Morelle M.D.   On: 04/10/2018 11:51   Nm Pet Image Initial (pi) Skull Base To Thigh  Result Date: 04/14/2018 CLINICAL DATA:  Initial treatment strategy for lung mass. EXAM: NUCLEAR MEDICINE PET SKULL BASE TO THIGH TECHNIQUE: 10.6 mCi F-18 FDG was injected intravenously. Full-ring PET imaging was performed from the skull base to thigh after the radiotracer. CT data was obtained and used for  attenuation correction and anatomic  localization. Fasting blood glucose: 114 mg/dl COMPARISON:  CT 04/10/2018 FINDINGS: Mediastinal blood pool activity: SUV max 2.6 NECK: No hypermetabolic lymph nodes in the neck. Incidental CT findings: none CHEST: LEFT upper lobe spiculated mass measuring 35 mm by 23 mm is intensely hypermetabolic with SUV max equal 13.5. Second nodule of concern in the RIGHT upper lobe is elongated measuring 14 mm (image 74/2). But does not have associated metabolic activity. Centrilobular emphysema the upper lobes. No hypermetabolic mediastinal lymph nodes. Incidental CT findings: none ABDOMEN/PELVIS: No hypermetabolic activity in the liver. Bilateral adrenal gland nodularity without significant metabolic activity consistent benign adenomas. There is cystic change within the body and tail the pancreas without hypermetabolic activity. There is an obstructing calculus in the distal pancreatic duct. Favor chronic pancreatitis with subsequent sequelae. No hypermetabolic abdominopelvic lymph nodes. There scattered metabolic activity throughout the colon favored physiologic. Incidental CT findings: none SKELETON: No focal hypermetabolic activity to suggest skeletal metastasis. Incidental CT findings: none IMPRESSION: 1. 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 No evidence of metastatic mediastinal adenopathy or distant metastatic disease ( T2a N0 M0. 2. RIGHT upper lobe nodule of concern does not have metabolic activity and favored benign. Recommend attention on follow-up. 3. Centrilobular emphysema in the upper lobes. 4. Sequelae of chronic pancreatitis with cystic change throughout the body and obstructing calculus in distal duct. 5. Bilateral adrenal adenomas. Electronically Signed   By: Suzy Bouchard M.D.   On: 04/14/2018 17:31    Assessment and plan- Patient is a 78 y.o. female with newly diagnosed spiculated left upper lobe mass concerning for  primary lung cancer  I have reviewed PET/CT scan images independently as well as CT chest abdomen and pelvis images independently and discussed findings with the patient.Patient noted to have a hypermetabolic 3.5 cm left upper lobe mass which is concerning for primary lung cancer.  There is no evidence of pathologic adenopathy and clinically she has stage Ib lung cancer T2b.  Given her chronic COPD and oxygen dependence I do not think that she is a candidate for definitive resection.  However I will discuss her case at tumor board later this week.  If it is agreed that she would not be a good surgical candidate then ideally we would need tissue diagnosis before referring her for radiation treatment.  Treatment will be given with a curative intent  Discussed that the biopsy could be done via bronchoscopy or CT-guided biopsy.  Given the location of the mass in the left upper lobe I suspect she will need a bronchoscopy.  I will discuss her case at tumor board this week and we will call the patient to set up further appointments.   Total face to face encounter time for this patient visit was 40 min. >50% of the time was  spent in counseling and coordination of care.     Thank you for this kind referral and the opportunity to participate in the care of this patient   Visit Diagnosis 1. Mass of upper lobe of left lung   2. Goals of care, counseling/discussion     Dr. Randa Evens, MD, MPH [facility] [phone] 04/18/2018 12:17 PM

## 2018-04-18 NOTE — Progress Notes (Deleted)
Hematology/Oncology Consult note [facility]  [phone] [phone]  Patient Care Team: [name], MD as PCP - General (Internal Medicine) Telford Nab, RN as Registered Nurse   Name of the patient: Lynn Robbins  [id number]  December 05, 1940   Date of visit: 04/18/18  Diagnosis- ***  Chief complaint/ Reason for visit- ***  Heme/Onc history: ***  Interval history- ***  ECOG PS- *** Pain scale- *** Opioid associated constipation- ***  Review of systems- ROS   Current treatment- ***  No Known Allergies   Past Medical History:  Diagnosis Date  . CHF (congestive heart failure) ([place])   . COPD (chronic obstructive pulmonary disease) ([place])   . Depression   . Diabetes mellitus without complication ([place])   . HOH (hard of hearing)   . Hypertension      Past Surgical History:  Procedure Laterality Date  . CESAREAN SECTION      Social History   Socioeconomic History  . Marital status: Single    Spouse name: Not on file  . Number of children: 5  . Years of education: Not on file  . Highest education level: Not on file  Occupational History  . Not on file  Social Needs  . Financial resource strain: Not on file  . Food insecurity:    Worry: Not on file    Inability: Not on file  . Transportation needs:    Medical: Not on file    Non-medical: Not on file  Tobacco Use  . Smoking status: Former Smoker    Packs/day: 3.00    Years: 57.00    Pack years: 171.00    Types: Cigarettes    Last attempt to quit: 04/17/1998    Years since quitting: 20.0  . Smokeless tobacco: Current User    Types: Snuff  Substance and Sexual Activity  . Alcohol use: No  . Drug use: No  . Sexual activity: Never  Lifestyle  . Physical activity:    Days per week: Not on file    Minutes per session: Not on file  . Stress: Not on file  Relationships  . Social connections:    Talks on phone: Not on file    Gets together: Not on file    Attends religious service: Not on file    Active member of club or organization: Not on file    Attends meetings of clubs or organizations: Not on file    Relationship status: Not on file  . Intimate partner violence:    Fear of current or ex partner: Not on file    Emotionally abused: Not on file    Physically abused: Not on file    Forced sexual activity: Not on file  Other Topics Concern  . Not on file  Social History Narrative   ** Merged History Encounter **        Family History  Problem Relation Age of Onset  . Diabetes Father   . Colon cancer Father   . Colon cancer Mother   . Aneurysm Mother 65       brain  . Lung cancer Sister   . Breast cancer Sister   . Lung cancer Brother   . Lung cancer Sister   . Bone cancer Brother   . Lung cancer Brother   . Cancer Sister        type unknown     Current Outpatient Medications:  .  ACCU-CHEK FASTCLIX LANCETS MISC, Use  as directed to check sugars. DX E11.65, Disp: 102 each, Rfl: 2 .  atenolol (TENORMIN) 25 MG tablet, Take 1 tablet (25 mg total) by mouth daily., Disp: 90 tablet, Rfl: 2 .  clonazePAM (KLONOPIN) 1 MG tablet, TAKE 1 TABLET (1 MG TOTAL) BY MOUTH 2 (TWO) TIMES DAILY AS NEEDED FOR ANXIETY., Disp: 60 tablet, Rfl: 0 .  glimepiride (AMARYL) 2 MG tablet, Take 1 tablet (2 mg total) by mouth daily with breakfast., Disp: 90 tablet, Rfl: 3 .  HYDROcodone-acetaminophen (NORCO/VICODIN) 5-325 MG tablet, Take 1 tablet by mouth 2 (two) times daily., Disp: 60 tablet, Rfl: 0 .  [START ON 04/21/2018] HYDROcodone-acetaminophen (NORCO/VICODIN) 5-325 MG tablet, Take 1 tablet by mouth 2 (two) times daily., Disp: 60 tablet, Rfl: 0 .  losartan-hydrochlorothiazide (HYZAAR) 50-12.5 MG tablet, Take 1 tablet by mouth daily., Disp: 90 tablet, Rfl: 2 .  naproxen (NAPROSYN) 500 MG tablet, Take 1 tablet by mouth 2 (two) times daily., Disp: , Rfl: 1 .  OXYGEN, Inhale into the lungs., Disp: , Rfl:  .  PROAIR HFA 108 (90 Base) MCG/ACT inhaler, 2  PUFFS EVERY 4 HOURS AS NEEDED, Disp: 25.5 Inhaler, Rfl: 4 .  SitaGLIPtin-MetFORMIN HCl (JANUMET XR) 50-500 MG TB24, TAKE ONE TAB A DAY WITH FOOD FOR DIABETES, Disp: 90 tablet, Rfl: 2 .  SPIRIVA HANDIHALER 18 MCG inhalation capsule, INHALE 1 CAPSULE VIA HANDIHALER ONCE DAILY AT THE SAME TIME EVERY DAY, Disp: 90 capsule, Rfl: 3  Physical exam:  Vitals:   04/17/18 1501  BP: (!) 159/92  Pulse: 85  Resp: 20  Temp: 98.1 F (36.7 C)  TempSrc: Tympanic  Weight: 188 lb (85.3 kg)  Height: 5' 0.63" (1.54 m)   Physical Exam   CMP Latest Ref Rng & Units 04/10/2018  Glucose 70 - 99 mg/dL 286(H)  BUN 8 - 23 mg/dL 15  Creatinine 0.44 - 1.00 mg/dL 0.68  Sodium 135 - 145 mmol/L 142  Potassium 3.5 - 5.1 mmol/L 4.3  Chloride 98 - 111 mmol/L 99  CO2 22 - 32 mmol/L 34(H)  Calcium 8.9 - 10.3 mg/dL 9.1  Total Protein 6.5 - 8.1 g/dL 6.4(L)  Total Bilirubin 0.3 - 1.2 mg/dL 0.3  Alkaline Phos 38 - 126 U/L 76  AST 15 - 41 U/L 19  ALT 0 - 44 U/L 15   CBC Latest Ref Rng & Units 04/10/2018  WBC 4.0 - 10.5 K/uL 7.1  Hemoglobin 12.0 - 15.0 g/dL 10.0(L)  Hematocrit 36.0 - 46.0 % 34.9(L)  Platelets 150 - 400 K/uL 224    No images are attached to the encounter.  Dg Chest 1 View  Result Date: 04/10/2018 CLINICAL DATA:  Chest pain EXAM: CHEST  1 VIEW COMPARISON:  None. FINDINGS: COPD with pulmonary hyperinflation. Left upper lobe mass lesion approximately 2 x 3 cm. Enlargement right hilum. This could be adenopathy or vascular enlargement. Heart size normal. Negative for heart failure. No pleural effusion. Right paratracheal soft tissue density with mild displacement of the trachea may represent goiter or vascular ectasia. Atherosclerotic aortic arch. IMPRESSION: COPD Left upper lobe mass possible carcinoma. Recommend CT chest with contrast. Enlargement of the right hilum which may be adenopathy or enlarged pulmonary artery. This can be evaluated at CT chest with contrast. Electronically Signed   By: Franchot Gallo M.D.   On: 04/10/2018 10:00   Ct Chest W Contrast  Result Date: 04/10/2018 CLINICAL DATA:  Chest and upper abdominal pain. Personal history of COPD, congestive heart failure, hypertension, and diabetes. EXAM: CT  CHEST, ABDOMEN, AND PELVIS WITH CONTRAST TECHNIQUE: Multidetector CT imaging of the chest, abdomen and pelvis was performed following the standard protocol during bolus administration of intravenous contrast. CONTRAST:  150mL OMNIPAQUE IOHEXOL 300 MG/ML  SOLN COMPARISON:  None. FINDINGS: CT CHEST FINDINGS Cardiovascular: Heart size is normal. Calcifications are present at the aortic valve. Additional calcifications are present at the aortic arch. There is no aneurysm. No significant stenosis is present at the origins the great vessels. Pulmonary arteries are enlarged bilaterally measuring up to 3.6 cm on the right and 2.9 cm on the left. No definite pulmonary emboli are present on this study. Of note, the study was not optimized for evaluation of pulmonary emboli. Mediastinum/Nodes: No significant mediastinal, hilar, or axillary adenopathy is present. Subcentimeter left hilar lymph nodes are present. Lungs/Pleura: A spiculated mass in the left upper lobe measures 2.1 x 3.5 x 1.9 cm. A spiculated peribronchial nodule present in the right upper lobe measuring 11 x 6 x 5 mm. There is some ground-glass attenuation posteriorly in the right upper lobe. A 3 mm nodule is present on image 45 of series 4. There is some scarring in the right middle lobe. No other nodules are present. Centrilobular emphysematous changes are noted Musculoskeletal: Vertebral body heights alignment are maintained. A hemangioma is present posteriorly at T7. No other focal lytic or blastic lesions are present. Mild endplate changes are present at T3-4. CT ABDOMEN PELVIS FINDINGS Hepatobiliary: No focal liver abnormality is seen. No gallstones, gallbladder wall thickening, or biliary dilatation. Pancreas: There is chronic  obstruction of the pancreatic duct. The duct is dilated. The pancreas is markedly atrophic. A pseudocyst is present distally. Spleen: Normal in size without focal abnormality. Adrenals/Urinary Tract: There is some fullness of the left adrenal gland without a discrete lesion. Subcentimeter cysts are present in the left kidney. A nonobstructing 8 mm stone is present at the lower pole of the right kidney and a second stone in the right kidney measures up to 5 mm. No significant nephrolithiasis is present on the left. Ureters are within normal limits. The urinary bladder is normal. Stomach/Bowel: Stomach is normal. A large duodenal diverticulum is present without associated inflammatory change. Small bowel is within normal limits. The ascending and transverse colon are within normal limits. Diverticular changes are present in the sigmoid colon. There is no associated inflammatory change. Vascular/Lymphatic: No significant retroperitoneal adenopathy is present. Atherosclerotic changes are present in the aorta and branch vessels without aneurysm. Reproductive: Uterus and bilateral adnexa are unremarkable. Other: No abdominal wall hernia or abnormality. No abdominopelvic ascites. Musculoskeletal: There is fusion across the disc space at L4-5 and L5-S1. Advanced degenerative disc disease is present at L1-2, L2-3, and L3-4. IMPRESSION: 1. Left upper lobe spiculated mass highly concerning for a primary bronchogenic neoplasm. Recommend whole-body PET scan. 2. Additional nodule in the right upper lobe may represent a synchronous tumor. 3. No significant mediastinal adenopathy. 4.  Emphysema (ICD10-J43.9). 5. Fullness of the adrenal glands bilaterally without a definite mass lesion. Metastatic disease could not be excluded. PET scan will be helpful for further evaluation. 6.  Aortic Atherosclerosis (ICD10-I70.0). 7. 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 8. Bilateral  nonobstructive nephrolithiasis. 9. Multilevel degenerative changes of the thoracolumbar spine. Electronically Signed   By: San Morelle M.D.   On: 04/10/2018 11:51   Ct Abdomen Pelvis W Contrast  Result Date: 04/10/2018 CLINICAL DATA:  Chest and upper abdominal pain. Personal history of COPD, congestive heart  failure, hypertension, and diabetes. EXAM: CT CHEST, ABDOMEN, AND PELVIS WITH CONTRAST TECHNIQUE: Multidetector CT imaging of the chest, abdomen and pelvis was performed following the standard protocol during bolus administration of intravenous contrast. CONTRAST:  1107mL OMNIPAQUE IOHEXOL 300 MG/ML  SOLN COMPARISON:  None. FINDINGS: CT CHEST FINDINGS Cardiovascular: Heart size is normal. Calcifications are present at the aortic valve. Additional calcifications are present at the aortic arch. There is no aneurysm. No significant stenosis is present at the origins the great vessels. Pulmonary arteries are enlarged bilaterally measuring up to 3.6 cm on the right and 2.9 cm on the left. No definite pulmonary emboli are present on this study. Of note, the study was not optimized for evaluation of pulmonary emboli. Mediastinum/Nodes: No significant mediastinal, hilar, or axillary adenopathy is present. Subcentimeter left hilar lymph nodes are present. Lungs/Pleura: A spiculated mass in the left upper lobe measures 2.1 x 3.5 x 1.9 cm. A spiculated peribronchial nodule present in the right upper lobe measuring 11 x 6 x 5 mm. There is some ground-glass attenuation posteriorly in the right upper lobe. A 3 mm nodule is present on image 45 of series 4. There is some scarring in the right middle lobe. No other nodules are present. Centrilobular emphysematous changes are noted Musculoskeletal: Vertebral body heights alignment are maintained. A hemangioma is present posteriorly at T7. No other focal lytic or blastic lesions are present. Mild endplate changes are present at T3-4. CT ABDOMEN PELVIS FINDINGS  Hepatobiliary: No focal liver abnormality is seen. No gallstones, gallbladder wall thickening, or biliary dilatation. Pancreas: There is chronic obstruction of the pancreatic duct. The duct is dilated. The pancreas is markedly atrophic. A pseudocyst is present distally. Spleen: Normal in size without focal abnormality. Adrenals/Urinary Tract: There is some fullness of the left adrenal gland without a discrete lesion. Subcentimeter cysts are present in the left kidney. A nonobstructing 8 mm stone is present at the lower pole of the right kidney and a second stone in the right kidney measures up to 5 mm. No significant nephrolithiasis is present on the left. Ureters are within normal limits. The urinary bladder is normal. Stomach/Bowel: Stomach is normal. A large duodenal diverticulum is present without associated inflammatory change. Small bowel is within normal limits. The ascending and transverse colon are within normal limits. Diverticular changes are present in the sigmoid colon. There is no associated inflammatory change. Vascular/Lymphatic: No significant retroperitoneal adenopathy is present. Atherosclerotic changes are present in the aorta and branch vessels without aneurysm. Reproductive: Uterus and bilateral adnexa are unremarkable. Other: No abdominal wall hernia or abnormality. No abdominopelvic ascites. Musculoskeletal: There is fusion across the disc space at L4-5 and L5-S1. Advanced degenerative disc disease is present at L1-2, L2-3, and L3-4. IMPRESSION: 1. Left upper lobe spiculated mass highly concerning for a primary bronchogenic neoplasm. Recommend whole-body PET scan. 2. Additional nodule in the right upper lobe may represent a synchronous tumor. 3. No significant mediastinal adenopathy. 4.  Emphysema (ICD10-J43.9). 5. Fullness of the adrenal glands bilaterally without a definite mass lesion. Metastatic disease could not be excluded. PET scan will be helpful for further evaluation. 6.  Aortic  Atherosclerosis (ICD10-I70.0). 7. 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 8. Bilateral nonobstructive nephrolithiasis. 9. Multilevel degenerative changes of the thoracolumbar spine. Electronically Signed   By: San Morelle M.D.   On: 04/10/2018 11:51   Nm Pet Image Initial (pi) Skull Base To Thigh  Result Date: 04/14/2018 CLINICAL DATA:  Initial  treatment strategy for lung mass. EXAM: NUCLEAR MEDICINE PET SKULL BASE TO THIGH TECHNIQUE: 10.6 mCi F-18 FDG was injected intravenously. Full-ring PET imaging was performed from the skull base to thigh after the radiotracer. CT data was obtained and used for attenuation correction and anatomic localization. Fasting blood glucose: 114 mg/dl COMPARISON:  CT 04/10/2018 FINDINGS: Mediastinal blood pool activity: SUV max 2.6 NECK: No hypermetabolic lymph nodes in the neck. Incidental CT findings: none CHEST: LEFT upper lobe spiculated mass measuring 35 mm by 23 mm is intensely hypermetabolic with SUV max equal 13.5. Second nodule of concern in the RIGHT upper lobe is elongated measuring 14 mm (image 74/2). But does not have associated metabolic activity. Centrilobular emphysema the upper lobes. No hypermetabolic mediastinal lymph nodes. Incidental CT findings: none ABDOMEN/PELVIS: No hypermetabolic activity in the liver. Bilateral adrenal gland nodularity without significant metabolic activity consistent benign adenomas. There is cystic change within the body and tail the pancreas without hypermetabolic activity. There is an obstructing calculus in the distal pancreatic duct. Favor chronic pancreatitis with subsequent sequelae. No hypermetabolic abdominopelvic lymph nodes. There scattered metabolic activity throughout the colon favored physiologic. Incidental CT findings: none SKELETON: No focal hypermetabolic activity to suggest skeletal metastasis. Incidental CT findings: none IMPRESSION: 1.  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 No evidence of metastatic mediastinal adenopathy or distant metastatic disease ( T2a N0 M0. 2. RIGHT upper lobe nodule of concern does not have metabolic activity and favored benign. Recommend attention on follow-up. 3. Centrilobular emphysema in the upper lobes. 4. Sequelae of chronic pancreatitis with cystic change throughout the body and obstructing calculus in distal duct. 5. Bilateral adrenal adenomas. Electronically Signed   By: Suzy Bouchard M.D.   On: 04/14/2018 17:31     Assessment and plan- Patient is a 78 y.o. female ***   Visit Diagnosis No diagnosis found.   Dr. Randa Evens, MD, MPH [facility] at [facility] [phone] 04/18/2018 12:09 PM

## 2018-04-20 ENCOUNTER — Telehealth: Payer: Self-pay | Admitting: *Deleted

## 2018-04-20 ENCOUNTER — Other Ambulatory Visit: Payer: Self-pay

## 2018-04-20 NOTE — Telephone Encounter (Signed)
Message left with nurse line at [facility] to get recommendations from Dr. Devona Konig regarding biopsy via bronchoscopy versus CT guidance. Awaiting call back.

## 2018-04-20 NOTE — Progress Notes (Signed)
Tumor Board Documentation  Lynn Robbins was presented by Dr Janese Banks at our Tumor Board on 04/20/2018, which included representatives from medical oncology, radiation oncology, surgical, radiology, pathology, navigation, internal medicine, research, pulmonology.  Lynn Robbins currently presents as a new patient, for discussion, for [place] with history of the following treatments: active survellience.  Additionally, we reviewed previous medical and familial history, history of present illness, and recent lab results along with all available histopathologic and imaging studies. The tumor board considered available treatment options and made the following recommendations: Biopsy, Radiation therapy (primary modality) CT biopsy vs Brochoscopic biopsy, Dr Susy Manor to discuss with Dr Humphrey Rolls  The following procedures/referrals were also placed: No orders of the defined types were placed in this encounter.   Clinical Trial Status: not discussed   Staging used: Clinical Stage  AJCC Staging: T: T2b        National site-specific guidelines NCCN were discussed with respect to the case.  Tumor board is a meeting of clinicians from various specialty areas who evaluate and discuss patients for whom a multidisciplinary approach is being considered. Final determinations in the plan of care are those of the provider(s). The responsibility for follow up of recommendations given during tumor board is that of the provider.   Today's extended care, comprehensive team conference, Lynn Robbins was not present for the discussion and was not examined.   Multidisciplinary Tumor Board is a multidisciplinary case peer review process.  Decisions discussed in the Multidisciplinary Tumor Board reflect the opinions of the specialists present at the conference without having examined the patient.  Ultimately, treatment and diagnostic decisions rest with the primary provider(s) and the patient.

## 2018-04-24 ENCOUNTER — Telehealth: Payer: Self-pay | Admitting: *Deleted

## 2018-04-24 DIAGNOSIS — R918 Other nonspecific abnormal finding of lung field: Secondary | ICD-10-CM

## 2018-04-24 NOTE — Addendum Note (Signed)
Addended by: Telford Nab on: 04/24/2018 12:57 PM   Modules accepted: Orders

## 2018-04-24 NOTE — Telephone Encounter (Signed)
-----   Message from [place] sent at 04/24/2018 12:06 PM EDT ----- Regarding: RE: Update Hey, Dr Humphrey Rolls recommends the CT Dept do the CT BX, he doesn't believe the [facility] would reach area, thanks for letting us know about mutual patient and please contact me if you need anything else. ----- Message ----- From: Telford Nab, RN Sent: 04/24/2018  10:00 AM EDT To: Laurie Panda Subject: Update                                         Good morning! Just wanted to check in to see if you have any updates on this patient.   Thank you! -Angie Fava

## 2018-04-28 ENCOUNTER — Other Ambulatory Visit: Payer: Self-pay | Admitting: Student

## 2018-05-01 ENCOUNTER — Ambulatory Visit: Admission: RE | Admit: 2018-05-01 | Payer: Medicare Other | Source: Ambulatory Visit

## 2018-05-01 ENCOUNTER — Telehealth: Payer: Self-pay | Admitting: *Deleted

## 2018-05-01 NOTE — Telephone Encounter (Signed)
Spoke with pt's daughter, Rosann Auerbach, to check in on patient since she missed her biopsy that was scheduled this morning. Per Rosann Auerbach, pt was not feeling well over the weekend and was started on an antibiotic and pt would like to be rescheduled to next week when she finishes her antibiotic. Informed Rosann Auerbach that will work on getting pt rescheduled for her biopsy and follow up appts. Rosann Auerbach verbalized understanding.

## 2018-05-01 NOTE — Telephone Encounter (Signed)
She called me over the weekend with symptoms of sob. I gave her 10 days of augmentin. But I would like her to follow up for her issues with her pcp and pulmonary since I am not giving her any chemo for lung cancer. She will likely only get radiation treatment and needs to see pcp and pulm for her chronic resp issues. Please let her pcp know. Thanks, Astrid Divine

## 2018-05-03 ENCOUNTER — Other Ambulatory Visit: Payer: Self-pay | Admitting: Adult Health

## 2018-05-03 DIAGNOSIS — I10 Essential (primary) hypertension: Secondary | ICD-10-CM

## 2018-05-03 NOTE — Telephone Encounter (Signed)
Patient has been scheduled for April 9.  Thank you

## 2018-05-04 ENCOUNTER — Other Ambulatory Visit: Payer: Self-pay | Admitting: *Deleted

## 2018-05-04 DIAGNOSIS — R918 Other nonspecific abnormal finding of lung field: Secondary | ICD-10-CM

## 2018-05-06 DIAGNOSIS — J449 Chronic obstructive pulmonary disease, unspecified: Secondary | ICD-10-CM | POA: Diagnosis not present

## 2018-05-08 ENCOUNTER — Ambulatory Visit: Payer: Self-pay | Admitting: Oncology

## 2018-05-10 ENCOUNTER — Ambulatory Visit: Payer: Medicare Other | Admitting: Radiation Oncology

## 2018-05-12 ENCOUNTER — Other Ambulatory Visit: Payer: Self-pay | Admitting: Student

## 2018-05-15 ENCOUNTER — Telehealth: Payer: Self-pay | Admitting: *Deleted

## 2018-05-15 ENCOUNTER — Ambulatory Visit: Admission: RE | Admit: 2018-05-15 | Payer: Medicare Other | Source: Ambulatory Visit

## 2018-05-15 NOTE — Telephone Encounter (Signed)
Pt's daughter called in to report that pt may have been exposed to COVID-19. States that her brother who pt lives with is currently hospitalized with COVID-19 symptoms and out of precaution has requested to cancel all appointments at this time to keep pt quarantined. Pt's son has been testing and results are pending at this time. Spoke with pt's daughter, Rosann Auerbach, and informed her that will keep her biopsy and other appts on hold and will reschedule at a later date/time.

## 2018-05-17 ENCOUNTER — Other Ambulatory Visit: Payer: Self-pay | Admitting: Adult Health

## 2018-05-17 ENCOUNTER — Other Ambulatory Visit: Payer: Self-pay

## 2018-05-17 ENCOUNTER — Telehealth: Payer: Self-pay

## 2018-05-17 DIAGNOSIS — F411 Generalized anxiety disorder: Secondary | ICD-10-CM

## 2018-05-17 MED ORDER — SITAGLIP PHOS-METFORMIN HCL ER 50-500 MG PO TB24: ORAL_TABLET | ORAL | 0 refills | Status: DC

## 2018-05-17 MED ORDER — CLONAZEPAM 1 MG PO TABS: 1.0000 mg | ORAL_TABLET | Freq: Two times a day (BID) | ORAL | 1 refills | Status: DC | PRN

## 2018-05-17 NOTE — Progress Notes (Signed)
Refilled patients clonopin RX, due to Covid 19, she should not come to office for face to face.

## 2018-05-18 ENCOUNTER — Ambulatory Visit: Payer: Medicare Other | Admitting: Internal Medicine

## 2018-05-18 NOTE — Telephone Encounter (Signed)
esend med

## 2018-05-22 ENCOUNTER — Institutional Professional Consult (permissible substitution): Payer: Self-pay | Admitting: Radiation Oncology

## 2018-05-22 ENCOUNTER — Ambulatory Visit: Payer: Self-pay | Admitting: Oncology

## 2018-05-25 ENCOUNTER — Encounter: Payer: Self-pay | Admitting: Internal Medicine

## 2018-05-25 ENCOUNTER — Ambulatory Visit (INDEPENDENT_AMBULATORY_CARE_PROVIDER_SITE_OTHER): Payer: Medicare Other | Admitting: Internal Medicine

## 2018-05-25 VITALS — BP 114/76 | HR 71 | Resp 18 | Ht 60.0 in | Wt 186.0 lb

## 2018-05-25 DIAGNOSIS — Z9981 Dependence on supplemental oxygen: Secondary | ICD-10-CM

## 2018-05-25 DIAGNOSIS — J449 Chronic obstructive pulmonary disease, unspecified: Secondary | ICD-10-CM | POA: Diagnosis not present

## 2018-05-25 DIAGNOSIS — R918 Other nonspecific abnormal finding of lung field: Secondary | ICD-10-CM

## 2018-05-25 NOTE — Progress Notes (Signed)
[facility] [place], [place] [address]  Pulmonary Sleep Medicine   Office Visit Note  Patient Name: Lynn Robbins DOB: 1940/12/22 MRN [id number]  Date of Service: 05/25/2018  Complaints/HPI: She has a lung mass. She was supposed to have a biopsy done however her son who she was living with was suspected to have [place] so therefore they cancelled the test. Since this time he has tested negative. Patient has no symptoms. No SOB no cough no fever. She states that she has not had any other symptoms.   ROS  General: (-) fever, (-) chills, (-) night sweats, (-) weakness Skin: (-) rashes, (-) itching,. Eyes: (-) visual changes, (-) redness, (-) itching. Nose and Sinuses: (-) nasal stuffiness or itchiness, (-) postnasal drip, (-) nosebleeds, (-) sinus trouble. Mouth and Throat: (-) sore throat, (-) hoarseness. Neck: (-) swollen glands, (-) enlarged thyroid, (-) neck pain. Respiratory: - cough, (-) bloody sputum, + shortness of breath, - wheezing. Cardiovascular: - ankle swelling, (-) chest pain. Lymphatic: (-) lymph node enlargement. Neurologic: (-) numbness, (-) tingling. Psychiatric: (-) anxiety, (-) depression   Current Medication: Outpatient Encounter Medications as of 05/25/2018  Medication Sig Note  . ACCU-CHEK FASTCLIX LANCETS MISC Use as directed to check sugars. DX E11.65   . atenolol (TENORMIN) 25 MG tablet Take 1 tablet (25 mg total) by mouth daily.   . clonazePAM (KLONOPIN) 1 MG tablet Take 1 tablet (1 mg total) by mouth 2 (two) times daily as needed for anxiety.   Marland Kitchen glimepiride (AMARYL) 2 MG tablet Take 1 tablet (2 mg total) by mouth daily with breakfast.   . HYDROcodone-acetaminophen (NORCO/VICODIN) 5-325 MG tablet Take 1 tablet by mouth 2 (two) times daily.   Marland Kitchen HYDROcodone-acetaminophen (NORCO/VICODIN) 5-325 MG tablet Take 1 tablet by mouth 2 (two) times daily.   Marland Kitchen losartan-hydrochlorothiazide (HYZAAR) 50-12.5 MG tablet TAKE 1 TABLET BY MOUTH  EVERY DAY   . naproxen (NAPROSYN) 500 MG tablet Take 1 tablet by mouth 2 (two) times daily. 01/05/2016: Received from: External Pharmacy Received Sig: TAKE 1 TABLET(S) BY MOUTH TWICE A DAY AS NEEDED FOR PAIN AND INFMALLATION  . OXYGEN Inhale into the lungs.   Marland Kitchen PROAIR HFA 108 (90 Base) MCG/ACT inhaler 2 PUFFS EVERY 4 HOURS AS NEEDED   . SitaGLIPtin-MetFORMIN HCl (JANUMET XR) 50-500 MG TB24 TAKE ONE TAB A DAY WITH FOOD FOR DIABETES   . SPIRIVA HANDIHALER 18 MCG inhalation capsule INHALE 1 CAPSULE VIA HANDIHALER ONCE DAILY AT THE SAME TIME EVERY DAY    No facility-administered encounter medications on file as of 05/25/2018.     Surgical History: Past Surgical History:  Procedure Laterality Date  . CESAREAN SECTION      Medical History: Past Medical History:  Diagnosis Date  . CHF (congestive heart failure) ([place])   . COPD (chronic obstructive pulmonary disease) ([place])   . Depression   . Diabetes mellitus without complication ([place])   . HOH (hard of hearing)   . Hypertension     Family History: Family History  Problem Relation Age of Onset  . Diabetes Father   . Colon cancer Father   . Colon cancer Mother   . Aneurysm Mother 20       brain  . Lung cancer Sister   . Breast cancer Sister   . Lung cancer Brother   . Lung cancer Sister   . Bone cancer Brother   . Lung cancer Brother   . Cancer Sister  type unknown    Social History: Social History   Socioeconomic History  . Marital status: Single    Spouse name: Not on file  . Number of children: 5  . Years of education: Not on file  . Highest education level: Not on file  Occupational History  . Not on file  Social Needs  . Financial resource strain: Not on file  . Food insecurity:    Worry: Not on file    Inability: Not on file  . Transportation needs:    Medical: Not on file    Non-medical: Not on file  Tobacco Use  . Smoking status: Former Smoker    Packs/day: 3.00    Years: 57.00    Pack years:  171.00    Types: Cigarettes    Last attempt to quit: 04/17/1998    Years since quitting: 20.1  . Smokeless tobacco: Current User    Types: Snuff  Substance and Sexual Activity  . Alcohol use: No  . Drug use: No  . Sexual activity: Never  Lifestyle  . Physical activity:    Days per week: Not on file    Minutes per session: Not on file  . Stress: Not on file  Relationships  . Social connections:    Talks on phone: Not on file    Gets together: Not on file    Attends religious service: Not on file    Active member of club or organization: Not on file    Attends meetings of clubs or organizations: Not on file    Relationship status: Not on file  . Intimate partner violence:    Fear of current or ex partner: Not on file    Emotionally abused: Not on file    Physically abused: Not on file    Forced sexual activity: Not on file  Other Topics Concern  . Not on file  Social History Narrative   ** Merged History Encounter **        Vital Signs: Blood pressure 114/76, pulse 71, resp. rate 18, height 5' (1.524 m), weight 186 lb (84.4 kg), SpO2 (!) 88 %.  Examination: General Appearance: The patient is well-developed, well-nourished, and in no distress. Skin: Gross inspection of skin unremarkable. Head: normocephalic, no gross deformities. Eyes: no gross deformities noted. ENT: ears appear grossly normal no exudates. Neck: Supple. No thyromegaly. No LAD. Respiratory: no rhonchi noted at this time. Cardiovascular: Normal S1 and S2 without murmur or rub. Extremities: No cyanosis. pulses are equal. Neurologic: Alert and oriented. No involuntary movements.  LABS: Recent Results (from the past 2160 hour(s))  POCT HgB A1C     Status: Abnormal   Collection Time: 04/03/18  3:16 PM  Result Value Ref Range   Hemoglobin A1C 6.2 (A) 4.0 - 5.6 %   HbA1c POC (<> result, manual entry)     HbA1c, POC (prediabetic range)     HbA1c, POC (controlled diabetic range)    Brain natriuretic  peptide     Status: None   Collection Time: 04/10/18  5:27 AM  Result Value Ref Range   B Natriuretic Peptide 49.0 0.0 - 100.0 pg/mL    Comment: Performed at [facility], [address] [place], [place] [address]  Lipase, blood     Status: None   Collection Time: 04/10/18  9:40 AM  Result Value Ref Range   Lipase 16 11 - 51 U/L    Comment: Performed at [facility], [place],  [place] [address]  Comprehensive metabolic panel     Status: Abnormal   Collection Time: 04/10/18  9:40 AM  Result Value Ref Range   Sodium 142 135 - 145 mmol/L   Potassium 4.3 3.5 - 5.1 mmol/L   Chloride 99 98 - 111 mmol/L   CO2 34 (H) 22 - 32 mmol/L   Glucose, Bld 286 (H) 70 - 99 mg/dL   BUN 15 8 - 23 mg/dL   Creatinine, Ser 0.68 0.44 - 1.00 mg/dL   Calcium 9.1 8.9 - 10.3 mg/dL   Total Protein 6.4 (L) 6.5 - 8.1 g/dL   Albumin 3.3 (L) 3.5 - 5.0 g/dL   AST 19 15 - 41 U/L   ALT 15 0 - 44 U/L   Alkaline Phosphatase 76 38 - 126 U/L   Total Bilirubin 0.3 0.3 - 1.2 mg/dL   GFR calc non Af Amer >60 >60 mL/min   GFR calc Af Amer >60 >60 mL/min   Anion gap 9 5 - 15    Comment: Performed at [facility], [address] [place], [place] [address]  CBC     Status: Abnormal   Collection Time: 04/10/18  9:40 AM  Result Value Ref Range   WBC 7.1 4.0 - 10.5 K/uL   RBC 3.40 (L) 3.87 - 5.11 MIL/uL   Hemoglobin 10.0 (L) 12.0 - 15.0 g/dL   HCT 34.9 (L) 36.0 - 46.0 %   MCV 102.6 (H) 80.0 - 100.0 fL   MCH 29.4 26.0 - 34.0 pg   MCHC 28.7 (L) 30.0 - 36.0 g/dL   RDW 13.6 11.5 - 15.5 %   Platelets 224 150 - 400 K/uL   nRBC 0.0 0.0 - 0.2 %    Comment: Performed at [facility], [address] [place], [place] [address]  Troponin I - ONCE - STAT     Status: None   Collection Time: 04/10/18  9:40 AM  Result Value Ref Range   Troponin I <0.03 <0.03 ng/mL    Comment: Performed at [facility], [address] [place], [place] [address]  Urinalysis, Complete  w Microscopic     Status: Abnormal   Collection Time: 04/10/18 10:52 AM  Result Value Ref Range   Color, Urine YELLOW (A) YELLOW   APPearance CLEAR (A) CLEAR   Specific Gravity, Urine 1.020 1.005 - 1.030   pH 5.0 5.0 - 8.0   Glucose, UA 150 (A) NEGATIVE mg/dL   Hgb urine dipstick NEGATIVE NEGATIVE   Bilirubin Urine NEGATIVE NEGATIVE   Ketones, ur NEGATIVE NEGATIVE mg/dL   Protein, ur NEGATIVE NEGATIVE mg/dL   Nitrite NEGATIVE NEGATIVE   Leukocytes,Ua NEGATIVE NEGATIVE   RBC / HPF 0-5 0 - 5 RBC/hpf   WBC, UA 0-5 0 - 5 WBC/hpf   Bacteria, UA NONE SEEN NONE SEEN   Squamous Epithelial / LPF 0-5 0 - 5   Mucus PRESENT    Hyaline Casts, UA PRESENT    Ca Oxalate Crys, UA PRESENT     Comment: Performed at [facility], [address] [place], [place] [address]  Troponin I - Once-Timed     Status: None   Collection Time: 04/10/18 11:47 AM  Result Value Ref Range   Troponin I <0.03 <0.03 ng/mL    Comment: Performed at [facility], [address] [place], [place] [address]  Glucose, capillary     Status: Abnormal   Collection Time: 04/14/18 12:03 PM  Result Value Ref Range  Glucose-Capillary 114 (H) 70 - 99 mg/dL    Radiology: Nm Pet Image Initial (pi) Skull Base To Thigh  Result Date: 04/14/2018 CLINICAL DATA:  Initial treatment strategy for lung mass. EXAM: NUCLEAR MEDICINE PET SKULL BASE TO THIGH TECHNIQUE: 10.6 mCi F-18 FDG was injected intravenously. Full-ring PET imaging was performed from the skull base to thigh after the radiotracer. CT data was obtained and used for attenuation correction and anatomic localization. Fasting blood glucose: 114 mg/dl COMPARISON:  CT 04/10/2018 FINDINGS: Mediastinal blood pool activity: SUV max 2.6 NECK: No hypermetabolic lymph nodes in the neck. Incidental CT findings: none CHEST: LEFT upper lobe spiculated mass measuring 35 mm by 23 mm is intensely hypermetabolic with SUV max equal 13.5. Second nodule of concern in the RIGHT  upper lobe is elongated measuring 14 mm (image 74/2). But does not have associated metabolic activity. Centrilobular emphysema the upper lobes. No hypermetabolic mediastinal lymph nodes. Incidental CT findings: none ABDOMEN/PELVIS: No hypermetabolic activity in the liver. Bilateral adrenal gland nodularity without significant metabolic activity consistent benign adenomas. There is cystic change within the body and tail the pancreas without hypermetabolic activity. There is an obstructing calculus in the distal pancreatic duct. Favor chronic pancreatitis with subsequent sequelae. No hypermetabolic abdominopelvic lymph nodes. There scattered metabolic activity throughout the colon favored physiologic. Incidental CT findings: none SKELETON: No focal hypermetabolic activity to suggest skeletal metastasis. Incidental CT findings: none IMPRESSION: 1. 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 No evidence of metastatic mediastinal adenopathy or distant metastatic disease ( T2a N0 M0. 2. RIGHT upper lobe nodule of concern does not have metabolic activity and favored benign. Recommend attention on follow-up. 3. Centrilobular emphysema in the upper lobes. 4. Sequelae of chronic pancreatitis with cystic change throughout the body and obstructing calculus in distal duct. 5. Bilateral adrenal adenomas. Electronically Signed   By: Suzy Bouchard M.D.   On: 04/14/2018 17:31    No results found.  No results found.    Assessment and Plan: Patient Active Problem List   Diagnosis Date Noted  . Goals of care, counseling/discussion 04/18/2018  . Mass of upper lobe of left lung 04/18/2018  . Intermittent asthma without complication 16/11/9602  . Chronic pain disorder 06/07/2017  . Hypertension 06/07/2017  . Mixed hyperlipidemia 02/03/2017  . Hypothyroidism 02/03/2017  . Type 2 diabetes mellitus with hyperglycemia ([place]) 02/03/2017  . Dysuria 02/03/2017    1. Lung Mass Upper  Lobe had a very lengthy discussion with the patient regarding the importance of getting the biopsy done as this could be cancer of the lung.  After my discussion she states that she is now more willing to go ahead and get it done. 2. COPD she has advanced severe COPD right now she is stable has not been admitted to the hospital recently. 3. Oxygen dependent under oxygen therapy she actually does better we will continue with supportive care  General Counseling: I have discussed the findings of the evaluation and examination with Lynn Robbins.  I have also discussed any further diagnostic evaluation thatmay be needed or ordered today. Lynn Robbins verbalizes understanding of the findings of todays visit. We also reviewed her medications today and discussed drug interactions and side effects including but not limited excessive drowsiness and altered mental states. We also discussed that there is always a risk not just to her but also people around her. she has been encouraged to call the office with any questions or concerns that should arise related to todays visit. Greater than  50% of the time was spent in counseling the patient   Time spent: 4min  I have personally obtained a history, examined the patient, evaluated laboratory and imaging results, formulated the assessment and plan and placed orders.    Allyne Gee, MD [facility] Pulmonary and Critical Care Sleep medicine

## 2018-05-29 ENCOUNTER — Encounter: Payer: Self-pay | Admitting: *Deleted

## 2018-05-29 NOTE — Progress Notes (Signed)
  Oncology Nurse Navigator Documentation  Navigator Location: [facility] Onc (05/29/18 1300)   )Navigator Encounter Type: Telephone (05/29/18 1300) Telephone: Lahoma Crocker Call;Appt Confirmation/Clarification (05/29/18 1300)                       Barriers/Navigation Needs: Coordination of Care (05/29/18 1300)   Interventions: Coordination of Care (05/29/18 1300)   Coordination of Care: Appts;Radiology (05/29/18 1300)           phone call made to patient's daughter, Rosann Auerbach, to review upcoming appts for biopsy and follow up at the [facility]. Reviewed all upcoming appts. Answered all questions during phone call. Instructed to call if has any further questions or needs. Rosann Auerbach verbalized understanding. Visitor policy reviewed with Rosann Auerbach as well.        Time Spent with Patient: 30 (05/29/18 1300)

## 2018-05-31 ENCOUNTER — Other Ambulatory Visit: Payer: Self-pay | Admitting: Student

## 2018-05-31 ENCOUNTER — Other Ambulatory Visit: Payer: Self-pay | Admitting: Radiology

## 2018-06-01 ENCOUNTER — Other Ambulatory Visit: Payer: Self-pay

## 2018-06-01 ENCOUNTER — Ambulatory Visit
Admission: RE | Admit: 2018-06-01 | Discharge: 2018-06-01 | Disposition: A | Discharge status: Home / Self Care | Payer: Medicare Other | Source: Ambulatory Visit | Attending: Oncology | Admitting: Oncology

## 2018-06-01 DIAGNOSIS — E119 Type 2 diabetes mellitus without complications: Secondary | ICD-10-CM | POA: Insufficient documentation

## 2018-06-01 DIAGNOSIS — R918 Other nonspecific abnormal finding of lung field: Secondary | ICD-10-CM | POA: Insufficient documentation

## 2018-06-01 DIAGNOSIS — I509 Heart failure, unspecified: Secondary | ICD-10-CM | POA: Insufficient documentation

## 2018-06-01 DIAGNOSIS — F17210 Nicotine dependence, cigarettes, uncomplicated: Secondary | ICD-10-CM | POA: Insufficient documentation

## 2018-06-01 DIAGNOSIS — I110 Hypertensive heart disease with heart failure: Secondary | ICD-10-CM | POA: Insufficient documentation

## 2018-06-01 DIAGNOSIS — J4520 Mild intermittent asthma, uncomplicated: Secondary | ICD-10-CM | POA: Insufficient documentation

## 2018-06-01 DIAGNOSIS — J449 Chronic obstructive pulmonary disease, unspecified: Secondary | ICD-10-CM | POA: Diagnosis not present

## 2018-06-01 DIAGNOSIS — E782 Mixed hyperlipidemia: Secondary | ICD-10-CM | POA: Insufficient documentation

## 2018-06-01 DIAGNOSIS — E039 Hypothyroidism, unspecified: Secondary | ICD-10-CM | POA: Diagnosis not present

## 2018-06-01 LAB — GLUCOSE, CAPILLARY: Glucose-Capillary: 141 mg/dL — ABNORMAL HIGH (ref 70–99)

## 2018-06-01 MED ORDER — SODIUM CHLORIDE 0.9 % IV SOLN: INTRAVENOUS | Status: DC

## 2018-06-01 NOTE — Progress Notes (Signed)
Interventional Radiology Progress Note  Ms. Virag presents today for CT guided biopsy of an anterior left upper lobe lung mass.  On presentation, she is not able to walk, barely able to stand, short of breath with exertion, on 3L O2 by nasal canula, not able to lie flat and has coarse bilateral breath sounds on exam.  I do not feel that she is a candidate for percutaneous lung biopsy, especially of an anterior upper lobe lesion, which is associated with a higher risk of pneumothorax with needle placement anteriorly and higher O2 tension in upper lung. She would have a high risk of significant morbidity or even mortality if she sustains a moderate pneumothorax or pulmonary hemorrhage/hemoptysis with percutaneous biopsy. I do not predict this risk decreasing over time given her chronic conditions.  Recommend referral to Pulmonology for consideration of possible navigational bronchoscopy and biopsy, which might be slightly safer.  Patient was sent home today and I will discuss reasons for cancellation with her family member(s) by phone.  Venetia Night. Kathlene Cote, M.D Pager:  [phone]

## 2018-06-02 ENCOUNTER — Encounter: Payer: Self-pay | Admitting: *Deleted

## 2018-06-02 DIAGNOSIS — R911 Solitary pulmonary nodule: Secondary | ICD-10-CM

## 2018-06-02 NOTE — Progress Notes (Signed)
  Oncology Nurse Navigator Documentation  Navigator Location: [facility] Onc (06/02/18 1300)   )Navigator Encounter Type: Telephone (06/02/18 1300) Telephone: Lynn Robbins Call;Patient Update (06/02/18 1300)                       Barriers/Navigation Needs: Coordination of Care (06/02/18 1300)   Interventions: Coordination of Care (06/02/18 1300)   Coordination of Care: Appts (06/02/18 1300)           spoke with pt's daughter, Lynn Robbins, and gave update regarding next steps. Informed that Dr. Janese Banks would like for pt to proceed with bronchoscopy if able to depending on evaluation from Dr. Patsey Berthold. Spoke with pt's PCP and current pulmonologist (Dr. Humphrey Rolls) who agreed for pt to be referred to [facility] for navigational bronchoscopy. Informed Lynn Robbins that appt on 4/24 will be tentatively cancelled at this time since will be seeing Dr. Patsey Berthold next week to discuss bronchoscopy. Informed that should expect call with appt information today or early next week. All questions answered during phone call. Instructed to call back if has any further questions or needs. Referral to Dr. Patsey Berthold has been placed. Nothing further needed at this time.        Time Spent with Patient: 30 (06/02/18 1300)

## 2018-06-06 DIAGNOSIS — J449 Chronic obstructive pulmonary disease, unspecified: Secondary | ICD-10-CM | POA: Diagnosis not present

## 2018-06-07 ENCOUNTER — Other Ambulatory Visit: Payer: Self-pay | Admitting: Adult Health

## 2018-06-07 DIAGNOSIS — I10 Essential (primary) hypertension: Secondary | ICD-10-CM

## 2018-06-08 ENCOUNTER — Institutional Professional Consult (permissible substitution): Payer: Self-pay | Admitting: Radiation Oncology

## 2018-06-08 ENCOUNTER — Telehealth: Payer: Self-pay | Admitting: *Deleted

## 2018-06-08 ENCOUNTER — Ambulatory Visit: Payer: Self-pay | Admitting: Oncology

## 2018-06-08 NOTE — Telephone Encounter (Signed)
Pt's daughter updated with treatment plan and informed that pt will see Dr. Baruch Gouty on Monday 4/27 at 2pm.

## 2018-06-08 NOTE — Telephone Encounter (Signed)
----- Message from Sindy Guadeloupe, MD sent at 06/08/2018 11:18 AM EDT ----- Regarding: RE: Lung biopsy Yes she needs to see DR. Chrystal. No need to see me now. Repeat ct chest abdomen pelvis 3 months from now and then see me.  Thanks, Astrid Divine ----- Message ----- From: Telford Nab, RN Sent: 06/08/2018  11:11 AM EDT To: Sindy Guadeloupe, MD, Tyler Pita, MD Subject: RE: Lung biopsy                                Dr. Janese Banks, would you like for me to have patient scheduled to see Dr. Baruch Gouty for consideration of SBRT?   -Loral Campi ----- Message ----- From: Tyler Pita, MD Sent: 06/07/2018   1:44 PM EDT To: Telford Nab, RN, Sindy Guadeloupe, MD Subject: RE: Lung biopsy                                I spoke to Dr. Laurelyn Sickle about this patient.  He has followed her since 1998.  He clearly does not think that she will be a candidate for general anesthesia.  He wanted to see is the CT biopsy of the lung could be reconsidered.  I have reviewed the film and she actually has bilateral lesions though the larger one is in the left upper lobe.  Aletta Edouard had concerns due to the fact that the patient could not lay flat, I trust Glenn's assessment in this regard.  I would recommend consideration for SBRT to the left upper lobe and continuing to monitor the right lesion with potential SBRT at a later time.  I think this would be the safest for the patient.  Let me know if there is anything else that I can help with. ----- Message ----- From: Sindy Guadeloupe, MD Sent: 06/02/2018   8:46 AM EDT To: Telford Nab, RN, Tyler Pita, MD Subject: RE: Lung biopsy                                Sure you can see her next week when you can. I have already seen her and she will most likely end of getting palliative RT alone and not chemotherapy.  Reshawn Ostlund- Can you arrange visit with Dr. Patsey Berthold?  ----- Message ----- From: Tyler Pita, MD Sent: 06/02/2018   8:25 AM EDT To: Telford Nab, RN, Sindy Guadeloupe, MD Subject: RE: Lung biopsy                                 Technically the procedure can be done. The issue would be can she tolerate general anesthesia? I can assess her and see if she needs "tunning up" she is on 3L chronically and this increases risk. We are being limited currently on "elective" procedures due to potential for aerosolization. I will be happy to assess her. I am in ICU this week end but can possibly see her in multidisciplinary clinic?  LG      ----- Message ----- From: Sindy Guadeloupe, MD Sent: 06/02/2018   8:12 AM EDT To: Telford Nab, RN, Tyler Pita, MD Subject: RE: Lung biopsy  Hello Dr. Patsey Berthold,   Please see message below especially from dr. Kathlene Cote. If you could review her ct scan and let us know if you can attempt nav bronch in her case. She seems high risk.   Thanks, Astrid Divine ----- Message ----- From: Telford Nab, RN Sent: 06/01/2018   4:56 PM EDT To: Sindy Guadeloupe, MD Subject: FW: Lung biopsy                                You can see message below from Dr. Laurelyn Sickle office. He wants [place] to go ahead and refer to Dr. Patsey Berthold for nav bronch since he does not do that type of procedure. I will get patient referred. Just wanted you to be aware.   Angie Fava ----- Message ----- From: Laurie Panda Sent: 06/01/2018   4:26 PM EDT To: Telford Nab, RN Subject: RE: Lung biopsy                                Yes, dr Humphrey Rolls usually doesn't do the navigation bronch ----- Message ----- From: Telford Nab, RN Sent: 06/01/2018  11:06 AM EDT To: Laurie Panda Subject: RE: Lung biopsy                                Just to clarify, he is okay with [place] referring to Dr. Patsey Berthold to perform the nav bronch?  Angie Fava ----- Message ----- From: Laurie Panda Sent: 06/01/2018  11:01 AM EDT To: Telford Nab, RN Subject: RE: Lung biopsy                                Hey I spoke with dr Humphrey Rolls and he said if you could have them to the  navigational one please. Thanks Beth ----- Message ----- From: Telford Nab, RN Sent: 06/01/2018  10:01 AM EDT To: Allyne Gee, MD, [name], MD, # Subject: Melton Alar: Lung biopsy                                Good morning!  Please see message below from Dr. Janese Banks and Dr. Kathlene Cote.   Pt unable to have percutaneous biopsy performed today. Dr. Humphrey Rolls, would you be able to do navigation bronchoscopy or should we send pt to Dr. Patsey Berthold with [facility] to perform navigational bronchoscopy?  Please let me know when you get a chance.   Thank you! -Tynell Winchell ----- Message ----- From: Sindy Guadeloupe, MD Sent: 06/01/2018   9:55 AM EDT To: Telford Nab, RN Subject: FW: Lung biopsy                                Hi Alaycia Eardley,  Please see below. Can you discuss this with Dr. Bea Laura? If he says he cannot do nav bronch- we will need to approach Dr. Patsey Berthold.  If neither of them want to do broonch- then you will need to touch base with Dr. Baruch Gouty if he can radiate empiricaly.  Please keep me posted and reschedule appointments accordingly.  Thanks, Astrid Divine ----- Message ----- From: Aletta Edouard, MD Sent: 06/01/2018   9:46 AM EDT To:  Sindy Guadeloupe, MD Subject: Lung biopsy                                    Astrid Divine,  Ms. Vicario is here today for LUL lung mass biopsy. She is barely able to stand, cannot lie flat, is on 3L of oxygen and has some coarse breath sounds. I think she is too high risk for a percutaneous lung biopsy, especially an anterior approach biopsy for this lesion, which is associated with higher risk of pneumothorax than a posterior approach.  I am going to send her home. I will document something in Epic. I think the only safe way to approach her lesion is by navigational bronchoscopy and maybe Derrill Kay can see her electively.  Thanks,  Eulas Post

## 2018-06-09 ENCOUNTER — Institutional Professional Consult (permissible substitution): Payer: Self-pay | Admitting: Radiation Oncology

## 2018-06-09 ENCOUNTER — Ambulatory Visit: Payer: Self-pay | Admitting: Oncology

## 2018-06-11 ENCOUNTER — Other Ambulatory Visit: Payer: Self-pay

## 2018-06-12 ENCOUNTER — Ambulatory Visit
Admission: RE | Admit: 2018-06-12 | Discharge: 2018-06-12 | Disposition: A | Discharge status: Home / Self Care | Payer: Medicare Other | Source: Ambulatory Visit | Attending: Radiation Oncology | Admitting: Radiation Oncology

## 2018-06-12 ENCOUNTER — Encounter: Payer: Self-pay | Admitting: Radiation Oncology

## 2018-06-12 ENCOUNTER — Other Ambulatory Visit: Payer: Self-pay

## 2018-06-12 ENCOUNTER — Encounter: Payer: Self-pay | Admitting: *Deleted

## 2018-06-12 VITALS — BP 143/85 | HR 80 | Resp 18

## 2018-06-12 DIAGNOSIS — Z803 Family history of malignant neoplasm of breast: Secondary | ICD-10-CM | POA: Diagnosis not present

## 2018-06-12 DIAGNOSIS — Z801 Family history of malignant neoplasm of trachea, bronchus and lung: Secondary | ICD-10-CM | POA: Diagnosis not present

## 2018-06-12 DIAGNOSIS — Z79899 Other long term (current) drug therapy: Secondary | ICD-10-CM | POA: Insufficient documentation

## 2018-06-12 DIAGNOSIS — Z800 Family history of malignant neoplasm of digestive organs: Secondary | ICD-10-CM | POA: Insufficient documentation

## 2018-06-12 DIAGNOSIS — Z87891 Personal history of nicotine dependence: Secondary | ICD-10-CM | POA: Diagnosis not present

## 2018-06-12 DIAGNOSIS — I509 Heart failure, unspecified: Secondary | ICD-10-CM | POA: Diagnosis not present

## 2018-06-12 DIAGNOSIS — Z809 Family history of malignant neoplasm, unspecified: Secondary | ICD-10-CM | POA: Diagnosis not present

## 2018-06-12 DIAGNOSIS — C3412 Malignant neoplasm of upper lobe, left bronchus or lung: Secondary | ICD-10-CM | POA: Insufficient documentation

## 2018-06-12 DIAGNOSIS — R918 Other nonspecific abnormal finding of lung field: Secondary | ICD-10-CM

## 2018-06-12 DIAGNOSIS — J449 Chronic obstructive pulmonary disease, unspecified: Secondary | ICD-10-CM | POA: Insufficient documentation

## 2018-06-12 DIAGNOSIS — Z808 Family history of malignant neoplasm of other organs or systems: Secondary | ICD-10-CM | POA: Diagnosis not present

## 2018-06-12 DIAGNOSIS — F329 Major depressive disorder, single episode, unspecified: Secondary | ICD-10-CM | POA: Insufficient documentation

## 2018-06-12 DIAGNOSIS — I10 Essential (primary) hypertension: Secondary | ICD-10-CM | POA: Diagnosis not present

## 2018-06-12 DIAGNOSIS — E119 Type 2 diabetes mellitus without complications: Secondary | ICD-10-CM | POA: Insufficient documentation

## 2018-06-12 NOTE — Consult Note (Signed)
NEW PATIENT EVALUATION  Name: Lynn Robbins  MRN: [id number]  Date:   06/12/2018     DOB: [birth year]   This 78 y.o. 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  REFERRING PHYSICIAN: Sindy Guadeloupe, MD  CHIEF COMPLAINT:  Chief Complaint  Patient presents with  . Lung Cancer    Initial consultation    DIAGNOSIS: The encounter diagnosis was Mass of upper lobe of left lung.   PREVIOUS INVESTIGATIONS:  CT scans and PET CT scan reviewed Clinical notes reviewed No pathology available  HPI: Patient is an 78 year old female with significant comorbidities including oxygen dependent COPD congestive heart failure.  She presented with some vague chest wall-like symptoms CT scan revealed a left upper lobe spiculated mass concerning for malignancy.  There is also a second nodule in the right upper lobe.  PET CT scan performed February 28 showed hypermetabolic left upper lobe mass measuring 3.5 x 2.3 cm with an SUV of 13.5.  Right upper lobe nodule which measured 1.4 cm was not hypermetabolic.  She was seen by radiology for attempted CT-guided biopsy although based on her extremely poor pulmonary functions her overall general condition not recommended for biopsy.  I discussed the case with medical oncology and is seen today for consideration of SBRT to her left upper lobe lesion.  She is quite emotional although comprehends my treatment plan well and is accompanied by nurse navigator as well as her daughter who is on the phone at the time of evaluation.  She specifically denies hemoptysis dysphasia or bone pain.  PLANNED TREATMENT REGIMEN: SBRT  PAST MEDICAL HISTORY:  has a past medical history of CHF (congestive heart failure) ([place]), COPD (chronic obstructive pulmonary disease) ([place]), Depression, Diabetes mellitus without complication ([place]), HOH (hard of hearing), and Hypertension.    PAST SURGICAL HISTORY:   Past Surgical History:  Procedure Laterality Date  . CESAREAN SECTION      FAMILY HISTORY: family history includes Aneurysm (age of onset: 9) in her mother; Bone cancer in her brother; Breast cancer in her sister; Cancer in her sister; Colon cancer in her father and mother; Diabetes in her father; Lung cancer in her brother, brother, sister, and sister.  SOCIAL HISTORY:  reports that she quit smoking about 20 years ago. Her smoking use included cigarettes. She has a 171.00 pack-year smoking history. Her smokeless tobacco use includes snuff. She reports that she does not drink alcohol or use drugs.  ALLERGIES: Patient has no known allergies.  MEDICATIONS:  Current Outpatient Medications  Medication Sig Dispense Refill  . ACCU-CHEK FASTCLIX LANCETS MISC Use as directed to check sugars. DX E11.65 102 each 2  . amLODipine (NORVASC) 10 MG tablet     . atenolol (TENORMIN) 25 MG tablet TAKE 1 TABLET BY MOUTH EVERY DAY 90 tablet 1  . clonazePAM (KLONOPIN) 1 MG tablet Take 1 tablet (1 mg total) by mouth 2 (two) times daily as needed for anxiety. 60 tablet 1  . glimepiride (AMARYL) 2 MG tablet Take 1 tablet (2 mg total) by mouth daily with breakfast. 90 tablet 3  . HYDROcodone-acetaminophen (NORCO/VICODIN) 5-325 MG tablet Take 1 tablet by mouth 2 (two) times daily. 60 tablet 0  . HYDROcodone-acetaminophen (NORCO/VICODIN) 5-325 MG tablet Take 1 tablet by mouth 2 (two) times daily. 60 tablet 0  . JANUMET XR 50-500 MG TB24 TAKE ONE TAB A DAY WITH FOOD FOR DIABETES  90 tablet 0  . losartan-hydrochlorothiazide (HYZAAR) 50-12.5 MG tablet TAKE 1 TABLET BY MOUTH EVERY DAY 90 tablet 0  . naproxen (NAPROSYN) 500 MG tablet Take 1 tablet by mouth 2 (two) times daily.  1  . OXYGEN Inhale into the lungs.    . pantoprazole (PROTONIX) 40 MG tablet     . phentermine (ADIPEX-P) 37.5 MG tablet     . PROAIR HFA 108 (90 Base) MCG/ACT inhaler 2 PUFFS EVERY 4 HOURS AS NEEDED 25.5 Inhaler 4  . SPIRIVA HANDIHALER 18 MCG  inhalation capsule INHALE 1 CAPSULE VIA HANDIHALER ONCE DAILY AT THE SAME TIME EVERY DAY 90 capsule 3  . SYNTHROID 125 MCG tablet     . topiramate (TOPAMAX) 50 MG tablet      No current facility-administered medications for this encounter.     ECOG PERFORMANCE STATUS:  2 - Symptomatic, <50% confined to bed  REVIEW OF SYSTEMS: Patient is oxygen dependent also has significant COPD and congestive heart failure.  Patient denies any weight loss, fatigue, weakness, fever, chills or night sweats. Patient denies any loss of vision, blurred vision. Patient denies any ringing  of the ears or hearing loss. No irregular heartbeat. Patient denies heart murmur or history of fainting. Patient denies any chest pain or pain radiating to her upper extremities. Patient denies any shortness of breath, difficulty breathing at night, cough or hemoptysis. Patient denies any swelling in the lower legs. Patient denies any nausea vomiting, vomiting of blood, or coffee ground material in the vomitus. Patient denies any stomach pain. Patient states has had normal bowel movements no significant constipation or diarrhea. Patient denies any dysuria, hematuria or significant nocturia. Patient denies any problems walking, swelling in the joints or loss of balance. Patient denies any skin changes, loss of hair or loss of weight. Patient denies any excessive worrying or anxiety or significant depression. Patient denies any problems with insomnia. Patient denies excessive thirst, polyuria, polydipsia. Patient denies any swollen glands, patient denies easy bruising or easy bleeding. Patient denies any recent infections, allergies or URI. Patient "s visual fields have not changed significantly in recent time.   PHYSICAL EXAM: BP (!) 143/85 (BP Location: Left Arm, Patient Position: Sitting)   Pulse 80   Resp 33  Well-developed elderly female wheelchair-bound in NAD on continuous nasal oxygen.  Well-developed well-nourished patient in NAD.  HEENT reveals PERLA, EOMI, discs not visualized.  Oral cavity is clear. No oral mucosal lesions are identified. Neck is clear without evidence of cervical or supraclavicular adenopathy. Lungs are clear to A&P. Cardiac examination is essentially unremarkable with regular rate and rhythm without murmur rub or thrill. Abdomen is benign with no organomegaly or masses noted. Motor sensory and DTR levels are equal and symmetric in the upper and lower extremities. Cranial nerves II through XII are grossly intact. Proprioception is intact. No peripheral adenopathy or edema is identified. No motor or sensory levels are noted. Crude visual fields are within normal range.  LABORATORY DATA: No pathology report    RADIOLOGY RESULTS: CT scans and PET CT scan reviewed and compatible with above-stated findings   IMPRESSION: Stage Ib non-small cell lung cancer left upper lobe in a 78 year old female with significant comorbidities for SBRT treatment  PLAN: At this time I have recommended SBRT treatment to her left upper lobe lesion.  We will plan on delivering 6007 cGy in 10 fractions.  We will use for dimensional treatment planning as well as motion restriction during treatment planning.  Risks and benefits  of treatment including slight fatigue possibility of developing a cough skin reaction all were discussed in detail with the patient her daughter with nurse navigator present.  Patient has accepted treatment.  I have personally set up and ordered 6 6 CT simulation in about a week's time based on the patient's preferences.  Her other lesion of the right upper lobe we will continue to observe based on its low hypermetabolic activity believe we can observe this at this time and make further recommendations with repeat CT scan following of this lesion down the road.  I would like to take this opportunity to thank you for allowing me to participate in the care of your patient.Noreene Filbert, MD

## 2018-06-12 NOTE — Progress Notes (Signed)
  Oncology Nurse Navigator Documentation  Navigator Location: [facility] (06/12/18 1500)   )Navigator Encounter Type: Initial RadOnc (06/12/18 1500)                       Treatment Phase: Pre-Tx/Tx Discussion (06/12/18 1500) Barriers/Navigation Needs: Coordination of Care;Education;Transportation (06/12/18 1500) Education: Understanding Cancer/ Treatment Options (06/12/18 1500) Interventions: Transportation;Coordination of Care;Education (06/12/18 1500)   Coordination of Care: Appts (06/12/18 1500) Education Method: Verbal (06/12/18 1500)     met with patient during initial rad-onc consultation with Dr. Baruch Gouty. All questions answered during visit. Pt's daughter, Rosann Auerbach, was present via conference call during visit and discussed treatment plan with Dr. Baruch Gouty. Reviewed upcoming appt for simulation with patient and daughter. Pt requested that appt for simulation be scheduled for after May 7th since has other appts already scheduled and she will need time to coordinate transportation with medicaid. Pt scheduled for simulation on 5/11 at 1pm. Appt info given to patient and also communicated to pt's daughter. Pt and daughter verbalized understanding. Nothing further needed at this time.            Time Spent with Patient: 60 (06/12/18 1500)

## 2018-06-14 ENCOUNTER — Other Ambulatory Visit: Payer: Self-pay | Admitting: Adult Health

## 2018-06-14 ENCOUNTER — Other Ambulatory Visit: Payer: Self-pay

## 2018-06-14 DIAGNOSIS — J4520 Mild intermittent asthma, uncomplicated: Secondary | ICD-10-CM

## 2018-06-14 DIAGNOSIS — F411 Generalized anxiety disorder: Secondary | ICD-10-CM

## 2018-06-14 MED ORDER — ALBUTEROL SULFATE HFA 108 (90 BASE) MCG/ACT IN AERS: INHALATION_SPRAY | RESPIRATORY_TRACT | 4 refills | Status: DC

## 2018-06-14 NOTE — Telephone Encounter (Signed)
As per DFK, ok to send rx for 90 day supply due to pt being on Chemo

## 2018-06-15 ENCOUNTER — Ambulatory Visit: Payer: Medicare Other

## 2018-06-22 ENCOUNTER — Ambulatory Visit: Payer: Medicare Other | Admitting: Adult Health

## 2018-06-23 ENCOUNTER — Other Ambulatory Visit: Payer: Self-pay

## 2018-06-25 ENCOUNTER — Other Ambulatory Visit: Payer: Self-pay

## 2018-06-26 ENCOUNTER — Ambulatory Visit
Admission: RE | Admit: 2018-06-26 | Discharge: 2018-06-26 | Disposition: A | Discharge status: Home / Self Care | Payer: Medicare Other | Source: Ambulatory Visit | Attending: Radiation Oncology | Admitting: Radiation Oncology

## 2018-06-26 ENCOUNTER — Other Ambulatory Visit: Payer: Self-pay

## 2018-06-26 DIAGNOSIS — Z510 Encounter for antineoplastic radiation therapy: Secondary | ICD-10-CM | POA: Insufficient documentation

## 2018-06-26 DIAGNOSIS — Z87891 Personal history of nicotine dependence: Secondary | ICD-10-CM | POA: Diagnosis not present

## 2018-06-26 DIAGNOSIS — C3412 Malignant neoplasm of upper lobe, left bronchus or lung: Secondary | ICD-10-CM | POA: Diagnosis not present

## 2018-06-27 ENCOUNTER — Encounter: Payer: Self-pay | Admitting: Adult Health

## 2018-06-27 DIAGNOSIS — Z510 Encounter for antineoplastic radiation therapy: Secondary | ICD-10-CM | POA: Diagnosis not present

## 2018-06-27 DIAGNOSIS — Z87891 Personal history of nicotine dependence: Secondary | ICD-10-CM | POA: Diagnosis not present

## 2018-06-27 DIAGNOSIS — C3412 Malignant neoplasm of upper lobe, left bronchus or lung: Secondary | ICD-10-CM | POA: Diagnosis not present

## 2018-06-30 ENCOUNTER — Encounter: Payer: Self-pay | Admitting: *Deleted

## 2018-06-30 DIAGNOSIS — R911 Solitary pulmonary nodule: Secondary | ICD-10-CM

## 2018-06-30 NOTE — Progress Notes (Signed)
  Oncology Nurse Navigator Documentation  Navigator Location: [facility] (06/30/18 1000)   )Navigator Encounter Type: Other (06/30/18 1000)                       Treatment Phase: CT SIM (06/30/18 1000) Barriers/Navigation Needs: Coordination of Care (06/30/18 1000)   Interventions: Coordination of Care (06/30/18 1000)   Coordination of Care: Appts;Radiology (06/30/18 1000)             Per Dr. Janese Banks, pt will need CT scan 3 months after completing radiation treatment. Pt currently scheduled to complete radiation on 6/2. Orders placed for CT chest and pt to follow up with Dr. Janese Banks after CT scan. Pt will be contacted once appts have been scheduled. Nothing further needed at this time.      Time Spent with Patient: 30 (06/30/18 1000)

## 2018-07-04 ENCOUNTER — Ambulatory Visit: Payer: Self-pay | Admitting: Internal Medicine

## 2018-07-04 ENCOUNTER — Other Ambulatory Visit: Payer: Self-pay

## 2018-07-04 ENCOUNTER — Ambulatory Visit
Admission: RE | Admit: 2018-07-04 | Discharge: 2018-07-04 | Disposition: A | Discharge status: Home / Self Care | Payer: Medicare Other | Source: Ambulatory Visit | Attending: Radiation Oncology | Admitting: Radiation Oncology

## 2018-07-04 ENCOUNTER — Encounter: Payer: Self-pay | Admitting: *Deleted

## 2018-07-04 DIAGNOSIS — C3412 Malignant neoplasm of upper lobe, left bronchus or lung: Secondary | ICD-10-CM | POA: Diagnosis not present

## 2018-07-04 DIAGNOSIS — Z510 Encounter for antineoplastic radiation therapy: Secondary | ICD-10-CM | POA: Diagnosis not present

## 2018-07-04 NOTE — Progress Notes (Signed)
  Oncology Nurse Navigator Documentation  Navigator Location: [facility] (07/04/18 0900)   )Navigator Encounter Type: Treatment (07/04/18 0900)                   Treatment Initiated Date: 07/04/18 (07/04/18 0900) Patient Visit Type: RadOnc (07/04/18 0900) Treatment Phase: First Radiation Tx (07/04/18 0900) Barriers/Navigation Needs: No barriers at this time (07/04/18 0900)   Interventions: None required (07/04/18 0900)                      Time Spent with Patient: 15 (07/04/18 0900)

## 2018-07-06 ENCOUNTER — Other Ambulatory Visit: Payer: Self-pay

## 2018-07-06 ENCOUNTER — Ambulatory Visit
Admission: RE | Admit: 2018-07-06 | Discharge: 2018-07-06 | Disposition: A | Discharge status: Home / Self Care | Payer: Medicare Other | Source: Ambulatory Visit | Attending: Radiation Oncology | Admitting: Radiation Oncology

## 2018-07-06 DIAGNOSIS — C3412 Malignant neoplasm of upper lobe, left bronchus or lung: Secondary | ICD-10-CM | POA: Diagnosis not present

## 2018-07-06 DIAGNOSIS — Z510 Encounter for antineoplastic radiation therapy: Secondary | ICD-10-CM | POA: Diagnosis not present

## 2018-07-06 DIAGNOSIS — J449 Chronic obstructive pulmonary disease, unspecified: Secondary | ICD-10-CM | POA: Diagnosis not present

## 2018-07-11 ENCOUNTER — Other Ambulatory Visit: Payer: Self-pay

## 2018-07-11 ENCOUNTER — Ambulatory Visit
Admission: RE | Admit: 2018-07-11 | Discharge: 2018-07-11 | Disposition: A | Discharge status: Home / Self Care | Payer: Medicare Other | Source: Ambulatory Visit | Attending: Radiation Oncology | Admitting: Radiation Oncology

## 2018-07-11 DIAGNOSIS — C3412 Malignant neoplasm of upper lobe, left bronchus or lung: Secondary | ICD-10-CM | POA: Diagnosis not present

## 2018-07-11 DIAGNOSIS — Z510 Encounter for antineoplastic radiation therapy: Secondary | ICD-10-CM | POA: Diagnosis not present

## 2018-07-12 ENCOUNTER — Ambulatory Visit: Payer: Medicare Other

## 2018-07-13 ENCOUNTER — Other Ambulatory Visit: Payer: Self-pay

## 2018-07-13 ENCOUNTER — Ambulatory Visit
Admission: RE | Admit: 2018-07-13 | Discharge: 2018-07-13 | Disposition: A | Discharge status: Home / Self Care | Payer: Medicare Other | Source: Ambulatory Visit | Attending: Radiation Oncology | Admitting: Radiation Oncology

## 2018-07-13 DIAGNOSIS — C3412 Malignant neoplasm of upper lobe, left bronchus or lung: Secondary | ICD-10-CM | POA: Diagnosis not present

## 2018-07-13 DIAGNOSIS — Z510 Encounter for antineoplastic radiation therapy: Secondary | ICD-10-CM | POA: Diagnosis not present

## 2018-07-14 ENCOUNTER — Ambulatory Visit: Payer: Medicare Other | Admitting: Oncology

## 2018-07-18 ENCOUNTER — Other Ambulatory Visit: Payer: Self-pay

## 2018-07-18 ENCOUNTER — Ambulatory Visit
Admission: RE | Admit: 2018-07-18 | Discharge: 2018-07-18 | Disposition: A | Discharge status: Home / Self Care | Payer: Medicare Other | Source: Ambulatory Visit | Attending: Radiation Oncology | Admitting: Radiation Oncology

## 2018-07-18 DIAGNOSIS — Z510 Encounter for antineoplastic radiation therapy: Secondary | ICD-10-CM | POA: Diagnosis not present

## 2018-07-18 DIAGNOSIS — C3412 Malignant neoplasm of upper lobe, left bronchus or lung: Secondary | ICD-10-CM | POA: Diagnosis not present

## 2018-07-18 DIAGNOSIS — Z87891 Personal history of nicotine dependence: Secondary | ICD-10-CM | POA: Diagnosis not present

## 2018-07-24 ENCOUNTER — Ambulatory Visit: Payer: Self-pay | Admitting: Adult Health

## 2018-07-24 ENCOUNTER — Other Ambulatory Visit: Payer: Self-pay

## 2018-07-24 ENCOUNTER — Other Ambulatory Visit: Payer: Self-pay | Admitting: Internal Medicine

## 2018-07-24 ENCOUNTER — Telehealth: Payer: Self-pay

## 2018-07-24 DIAGNOSIS — G894 Chronic pain syndrome: Secondary | ICD-10-CM

## 2018-07-24 DIAGNOSIS — F411 Generalized anxiety disorder: Secondary | ICD-10-CM

## 2018-07-24 DIAGNOSIS — I10 Essential (primary) hypertension: Secondary | ICD-10-CM

## 2018-07-24 DIAGNOSIS — E1165 Type 2 diabetes mellitus with hyperglycemia: Secondary | ICD-10-CM

## 2018-07-24 MED ORDER — ATENOLOL 25 MG PO TABS: 25.0000 mg | ORAL_TABLET | Freq: Every day | ORAL | 0 refills | Status: DC

## 2018-07-24 MED ORDER — SITAGLIP PHOS-METFORMIN HCL ER 50-500 MG PO TB24: ORAL_TABLET | ORAL | 0 refills | Status: DC

## 2018-07-24 MED ORDER — GLIMEPIRIDE 2 MG PO TABS: 2.0000 mg | ORAL_TABLET | Freq: Every day | ORAL | 0 refills | Status: DC

## 2018-07-24 MED ORDER — HYDROCODONE-ACETAMINOPHEN 5-325 MG PO TABS: ORAL_TABLET | ORAL | 0 refills | Status: DC

## 2018-07-24 MED ORDER — LOSARTAN POTASSIUM-HCTZ 50-12.5 MG PO TABS: 1.0000 | ORAL_TABLET | Freq: Every day | ORAL | 0 refills | Status: DC

## 2018-07-24 MED ORDER — CLONAZEPAM 1 MG PO TABS: ORAL_TABLET | ORAL | 0 refills | Status: DC

## 2018-07-28 ENCOUNTER — Encounter: Payer: Self-pay | Admitting: *Deleted

## 2018-07-28 NOTE — Telephone Encounter (Signed)
Pt medications were sent on 07/24/2018

## 2018-08-06 DIAGNOSIS — J449 Chronic obstructive pulmonary disease, unspecified: Secondary | ICD-10-CM | POA: Diagnosis not present

## 2018-08-10 ENCOUNTER — Ambulatory Visit (INDEPENDENT_AMBULATORY_CARE_PROVIDER_SITE_OTHER): Payer: Medicare Other | Admitting: Internal Medicine

## 2018-08-10 ENCOUNTER — Encounter: Payer: Self-pay | Admitting: Internal Medicine

## 2018-08-10 ENCOUNTER — Other Ambulatory Visit: Payer: Self-pay

## 2018-08-10 VITALS — BP 132/78 | HR 85 | Resp 16 | Ht 60.0 in | Wt 189.0 lb

## 2018-08-10 DIAGNOSIS — J449 Chronic obstructive pulmonary disease, unspecified: Secondary | ICD-10-CM

## 2018-08-10 DIAGNOSIS — Z9981 Dependence on supplemental oxygen: Secondary | ICD-10-CM | POA: Diagnosis not present

## 2018-08-10 DIAGNOSIS — C3412 Malignant neoplasm of upper lobe, left bronchus or lung: Secondary | ICD-10-CM | POA: Diagnosis not present

## 2018-08-10 NOTE — Patient Instructions (Signed)
Pulmonary Nodule  A pulmonary nodule is tissue that has grown on your lung. A nodule may be cancer, but most nodules are not cancer.  Follow these instructions at home:     Take over-the-counter and prescription medicines only as told by your doctor.   Do not use any products that have nicotine or tobacco, such as cigarettes and e-cigarettes. If you need help quitting, ask your doctor.   Keep all follow-up visits as told by your doctor. This is important.  Contact a doctor if:   You have trouble breathing when doing activities.   You feel sick.   You feel more tired than normal.   You do not feel like eating.   You lose weight without trying.   You have chills.   You have night sweats.  Get help right away if:   You cannot catch your breath.   You start making whistling sounds when breathing (wheezing).   You cannot stop coughing.   You cough up blood.   You get dizzy.   You feel like you are going to pass out (faint).   You have sudden chest pain.   You have a fever or symptoms for more than 2-3 days.   You have a fever and your symptoms suddenly get worse.  Summary   A pulmonary nodule is tissue that has grown on your lung.   Most nodules are not cancer.   Your doctor will do tests to know what kind of nodule you have, and whether you need treatment for it.  This information is not intended to replace advice given to you by your health care provider. Make sure you discuss any questions you have with your health care provider.  Document Released: 03/06/2010 Document Revised: 03/02/2016 Document Reviewed: 03/02/2016  Elsevier Interactive Patient Education  2019 [employer]

## 2018-08-10 NOTE — Progress Notes (Signed)
[facility] [place], [place] [address]  Pulmonary Sleep Medicine   Office Visit Note  Patient Name: Lynn Robbins DOB: 1940/07/05 MRN [id number]  Date of Service: 08/10/2018  Complaints/HPI: She has been getting RT for her lung cancer. Patient has had good response. She will follow up with oncology for a follow up CT scan. Patient states she is using  Her oxygen as ordered. Patient states overall comfortable no distress.  ROS  General: (-) fever, (-) chills, (-) night sweats, (-) weakness Skin: (-) rashes, (-) itching,. Eyes: (-) visual changes, (-) redness, (-) itching. Nose and Sinuses: (-) nasal stuffiness or itchiness, (-) postnasal drip, (-) nosebleeds, (-) sinus trouble. Mouth and Throat: (-) sore throat, (-) hoarseness. Neck: (-) swollen glands, (-) enlarged thyroid, (-) neck pain. Respiratory: - cough, (-) bloody sputum, + shortness of breath, - wheezing. Cardiovascular: - ankle swelling, (-) chest pain. Lymphatic: (-) lymph node enlargement. Neurologic: (-) numbness, (-) tingling. Psychiatric: (-) anxiety, (-) depression   Current Medication: Outpatient Encounter Medications as of 08/10/2018  Medication Sig Note  . ACCU-CHEK FASTCLIX LANCETS MISC Use as directed to check sugars. DX E11.65   . albuterol (PROAIR HFA) 108 (90 Base) MCG/ACT inhaler 2 PUFFS EVERY 4 HOURS AS NEEDED   . amLODipine (NORVASC) 10 MG tablet    . atenolol (TENORMIN) 25 MG tablet Take 1 tablet (25 mg total) by mouth daily.   . clonazePAM (KLONOPIN) 1 MG tablet Take one tab po bid for anxiety   . glimepiride (AMARYL) 2 MG tablet Take 1 tablet (2 mg total) by mouth daily with breakfast.   . HYDROcodone-acetaminophen (NORCO/VICODIN) 5-325 MG tablet Take one tab po bid prn for back pain   . losartan-hydrochlorothiazide (HYZAAR) 50-12.5 MG tablet Take 1 tablet by mouth daily.   . naproxen (NAPROSYN) 500 MG tablet Take 1 tablet by mouth 2 (two) times daily. 01/05/2016:  Received from: External Pharmacy Received Sig: TAKE 1 TABLET(S) BY MOUTH TWICE A DAY AS NEEDED FOR PAIN AND INFMALLATION  . OXYGEN Inhale into the lungs.   . pantoprazole (PROTONIX) 40 MG tablet    . SitaGLIPtin-MetFORMIN HCl (JANUMET XR) 50-500 MG TB24 TAKE ONE TAB A DAY WITH FOOD FOR DIABETES   . SPIRIVA HANDIHALER 18 MCG inhalation capsule INHALE 1 CAPSULE VIA HANDIHALER ONCE DAILY AT THE SAME TIME EVERY DAY   . SYNTHROID 125 MCG tablet    . topiramate (TOPAMAX) 50 MG tablet     No facility-administered encounter medications on file as of 08/10/2018.     Surgical History: Past Surgical History:  Procedure Laterality Date  . CESAREAN SECTION      Medical History: Past Medical History:  Diagnosis Date  . CHF (congestive heart failure) ([place])   . COPD (chronic obstructive pulmonary disease) ([place])   . Depression   . Diabetes mellitus without complication ([place])   . HOH (hard of hearing)   . Hypertension     Family History: Family History  Problem Relation Age of Onset  . Diabetes Father   . Colon cancer Father   . Colon cancer Mother   . Aneurysm Mother 57       brain  . Lung cancer Sister   . Breast cancer Sister   . Lung cancer Brother   . Lung cancer Sister   . Bone cancer Brother   . Lung cancer Brother   . Cancer Sister        type unknown    Social History:  Social History   Socioeconomic History  . Marital status: Single    Spouse name: Not on file  . Number of children: 5  . Years of education: Not on file  . Highest education level: Not on file  Occupational History  . Not on file  Social Needs  . Financial resource strain: Not on file  . Food insecurity    Worry: Not on file    Inability: Not on file  . Transportation needs    Medical: Not on file    Non-medical: Not on file  Tobacco Use  . Smoking status: Former Smoker    Packs/day: 3.00    Years: 57.00    Pack years: 171.00    Types: Cigarettes    Quit date: 04/17/1998    Years since  quitting: 20.3  . Smokeless tobacco: Current User    Types: Snuff  Substance and Sexual Activity  . Alcohol use: No  . Drug use: No  . Sexual activity: Never  Lifestyle  . Physical activity    Days per week: Not on file    Minutes per session: Not on file  . Stress: Not on file  Relationships  . Social Herbalist on phone: Not on file    Gets together: Not on file    Attends religious service: Not on file    Active member of club or organization: Not on file    Attends meetings of clubs or organizations: Not on file    Relationship status: Not on file  . Intimate partner violence    Fear of current or ex partner: Not on file    Emotionally abused: Not on file    Physically abused: Not on file    Forced sexual activity: Not on file  Other Topics Concern  . Not on file  Social History Narrative   ** Merged History Encounter **        Vital Signs: Blood pressure 132/78, pulse 85, resp. rate 16, height 5' (1.524 m), weight 189 lb (85.7 kg), SpO2 96 %.  Examination: General Appearance: The patient is well-developed, well-nourished, and in no distress. Skin: Gross inspection of skin unremarkable. Head: normocephalic, no gross deformities. Eyes: no gross deformities noted. ENT: ears appear grossly normal no exudates. Neck: Supple. No thyromegaly. No LAD. Respiratory: no rhonchi noted at this time. Cardiovascular: Normal S1 and S2 without murmur or rub. Extremities: No cyanosis. pulses are equal. Neurologic: Alert and oriented. No involuntary movements.  LABS: Recent Results (from the past 2160 hour(s))  Glucose, capillary     Status: Abnormal   Collection Time: 06/01/18  9:39 AM  Result Value Ref Range   Glucose-Capillary 141 (H) 70 - 99 mg/dL    Radiology: No results found.  No results found.  No results found.    Assessment and Plan: Patient Active Problem List   Diagnosis Date Noted  . Goals of care, counseling/discussion 04/18/2018  . Mass of  upper lobe of left lung 04/18/2018  . Intermittent asthma without complication 85/27/7824  . Chronic pain disorder 06/07/2017  . Hypertension 06/07/2017  . Mixed hyperlipidemia 02/03/2017  . Hypothyroidism 02/03/2017  . Type 2 diabetes mellitus with hyperglycemia ([place]) 02/03/2017  . Dysuria 02/03/2017    1. Lung Cancer upper lobe undergoing RT. She seems to be responding. She is going to have a followup CT scan of the chest 2. COPD at baseline continue with inhalers as prescribed 3. Oxygen dependent on O2 which will be continued  General Counseling: I have discussed the findings of the evaluation and examination with Nalia.  I have also discussed any further diagnostic evaluation thatmay be needed or ordered today. Marshell verbalizes understanding of the findings of todays visit. We also reviewed her medications today and discussed drug interactions and side effects including but not limited excessive drowsiness and altered mental states. We also discussed that there is always a risk not just to her but also people around her. she has been encouraged to call the office with any questions or concerns that should arise related to todays visit.    Time spent: 15  I have personally obtained a history, examined the patient, evaluated laboratory and imaging results, formulated the assessment and plan and placed orders.    Allyne Gee, MD [facility] Pulmonary and Critical Care Sleep medicine

## 2018-08-22 ENCOUNTER — Other Ambulatory Visit: Payer: Self-pay

## 2018-08-23 ENCOUNTER — Encounter: Payer: Self-pay | Admitting: Internal Medicine

## 2018-08-23 ENCOUNTER — Ambulatory Visit: Payer: Medicare Other | Admitting: Radiation Oncology

## 2018-08-23 ENCOUNTER — Ambulatory Visit (INDEPENDENT_AMBULATORY_CARE_PROVIDER_SITE_OTHER): Payer: Medicare Other | Admitting: Internal Medicine

## 2018-08-23 VITALS — BP 157/93 | HR 81 | Resp 16 | Ht 60.0 in | Wt 189.0 lb

## 2018-08-23 DIAGNOSIS — Z79899 Other long term (current) drug therapy: Secondary | ICD-10-CM | POA: Diagnosis not present

## 2018-08-23 DIAGNOSIS — I10 Essential (primary) hypertension: Secondary | ICD-10-CM

## 2018-08-23 DIAGNOSIS — E1165 Type 2 diabetes mellitus with hyperglycemia: Secondary | ICD-10-CM

## 2018-08-23 DIAGNOSIS — F411 Generalized anxiety disorder: Secondary | ICD-10-CM

## 2018-08-23 DIAGNOSIS — C3492 Malignant neoplasm of unspecified part of left bronchus or lung: Secondary | ICD-10-CM | POA: Diagnosis not present

## 2018-08-23 DIAGNOSIS — G894 Chronic pain syndrome: Secondary | ICD-10-CM | POA: Diagnosis not present

## 2018-08-23 DIAGNOSIS — Z9981 Dependence on supplemental oxygen: Secondary | ICD-10-CM

## 2018-08-23 DIAGNOSIS — J449 Chronic obstructive pulmonary disease, unspecified: Secondary | ICD-10-CM

## 2018-08-23 LAB — POCT GLYCOSYLATED HEMOGLOBIN (HGB A1C): Hemoglobin A1C: 6.9 % — AB (ref 4.0–5.6)

## 2018-08-23 LAB — POCT URINE DRUG SCREEN
POC Amphetamine UR: NOT DETECTED
POC BENZODIAZEPINES UR: NOT DETECTED
POC Barbiturate UR: NOT DETECTED
POC Cocaine UR: NOT DETECTED
POC Ecstasy UR: NOT DETECTED
POC Marijuana UR: NOT DETECTED
POC Methadone UR: NOT DETECTED
POC Methamphetamine UR: NOT DETECTED
POC Opiate Ur: POSITIVE — AB
POC Oxycodone UR: NOT DETECTED
POC PHENCYCLIDINE UR: NOT DETECTED
POC TRICYCLICS UR: NOT DETECTED
URINE TEMPERATURE: 90 Degrees F (ref 90.0–100.0)

## 2018-08-23 MED ORDER — HYDROCODONE-ACETAMINOPHEN 5-325 MG PO TABS: ORAL_TABLET | ORAL | 0 refills | Status: DC

## 2018-08-23 NOTE — Progress Notes (Signed)
[facility] [place], [place] [address]  Internal MEDICINE  Office Visit Note  Patient Name: Lynn Robbins  [id number]  [id number]  Date of Service: 08/23/2018  Chief Complaint  Patient presents with  . Medical Management of Chronic Issues    medication refills , would like 90 day supply   . Anxiety    bp elevated   . Depression  . 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 No evidence of metastatic mediastinal adenopathy or distant metastatic disease ( T2a N0 M0)  finished her radiation treatment. C/o left shoulder pain. Blood sugar have been slightly elevated, has anxiety and panic disorder, BP is slightly elevated as well. No other problems, back pain is under fair control with hydrocodone  Current Medication: Outpatient Encounter Medications as of 08/23/2018  Medication Sig Note  . ACCU-CHEK FASTCLIX LANCETS MISC Use as directed to check sugars. DX E11.65   . albuterol (PROAIR HFA) 108 (90 Base) MCG/ACT inhaler 2 PUFFS EVERY 4 HOURS AS NEEDED   . amLODipine (NORVASC) 10 MG tablet    . atenolol (TENORMIN) 25 MG tablet Take 1 tablet (25 mg total) by mouth daily.   . clonazePAM (KLONOPIN) 1 MG tablet Take one tab po bid for anxiety   . glimepiride (AMARYL) 2 MG tablet Take 1 tablet (2 mg total) by mouth daily with breakfast.   . HYDROcodone-acetaminophen (NORCO/VICODIN) 5-325 MG tablet Take one tab po bid prn for back pain   . losartan-hydrochlorothiazide (HYZAAR) 50-12.5 MG tablet Take 1 tablet by mouth daily.   . naproxen (NAPROSYN) 500 MG tablet Take 1 tablet by mouth 2 (two) times daily. 01/05/2016: Received from: External Pharmacy Received Sig: TAKE 1 TABLET(S) BY MOUTH TWICE A DAY AS  NEEDED FOR PAIN AND INFMALLATION  . OXYGEN Inhale into the lungs.   . pantoprazole (PROTONIX) 40 MG tablet    . SitaGLIPtin-MetFORMIN HCl (JANUMET XR) 50-500 MG TB24 TAKE ONE TAB A DAY WITH FOOD FOR DIABETES   . SPIRIVA HANDIHALER 18 MCG inhalation capsule INHALE 1 CAPSULE VIA HANDIHALER ONCE DAILY AT THE SAME TIME EVERY DAY   . SYNTHROID 125 MCG tablet    . topiramate (TOPAMAX) 50 MG tablet    . [DISCONTINUED] HYDROcodone-acetaminophen (NORCO/VICODIN) 5-325 MG tablet Take one tab po bid prn for back pain    No facility-administered encounter medications on file as of 08/23/2018.     Surgical History: Past Surgical History:  Procedure Laterality Date  . CESAREAN SECTION      Medical History: Past Medical History:  Diagnosis Date  . CHF (congestive heart failure) ([place])   . COPD (chronic obstructive pulmonary disease) ([place])   . Depression   . Diabetes mellitus without complication ([place])   . HOH (hard of hearing)   . Hypertension     Family History: Family History  Problem Relation Age of Onset  . Diabetes Father   . Colon cancer Father   . Colon cancer Mother   . Aneurysm Mother 76       brain  . Lung cancer Sister   . Breast cancer Sister   . Lung cancer Brother   . Lung cancer Sister   .  Bone cancer Brother   . Lung cancer Brother   . Cancer Sister        type unknown    Social History   Socioeconomic History  . Marital status: Single    Spouse name: Not on file  . Number of children: 5  . Years of education: Not on file  . Highest education level: Not on file  Occupational History  . Not on file  Social Needs  . Financial resource strain: Not on file  . Food insecurity    Worry: Not on file    Inability: Not on file  . Transportation needs    Medical: Not on file    Non-medical: Not on file  Tobacco Use  . Smoking status: Former Smoker    Packs/day: 3.00    Years: 57.00    Pack years: 171.00    Types: Cigarettes    Quit date: 04/17/1998    Years  since quitting: 20.3  . Smokeless tobacco: Current User    Types: Snuff  Substance and Sexual Activity  . Alcohol use: No  . Drug use: No  . Sexual activity: Never  Lifestyle  . Physical activity    Days per week: Not on file    Minutes per session: Not on file  . Stress: Not on file  Relationships  . Social Herbalist on phone: Not on file    Gets together: Not on file    Attends religious service: Not on file    Active member of club or organization: Not on file    Attends meetings of clubs or organizations: Not on file    Relationship status: Not on file  . Intimate partner violence    Fear of current or ex partner: Not on file    Emotionally abused: Not on file    Physically abused: Not on file    Forced sexual activity: Not on file  Other Topics Concern  . Not on file  Social History Narrative   ** Merged History Encounter **        Review of Systems  Constitutional: Negative for chills, diaphoresis and fatigue.  HENT: Negative for ear pain, postnasal drip and sinus pressure.   Eyes: Negative for photophobia, discharge, redness, itching and visual disturbance.  Respiratory: Positive for shortness of breath (on home O2). Negative for cough and wheezing.   Cardiovascular: Negative for chest pain, palpitations and leg swelling.  Gastrointestinal: Negative for abdominal pain, constipation, diarrhea, nausea and vomiting.  Genitourinary: Negative for dysuria and flank pain.  Musculoskeletal: Positive for back pain. Negative for arthralgias, gait problem and neck pain.  Skin: Negative for color change.  Allergic/Immunologic: Negative for environmental allergies and food allergies.  Neurological: Negative for dizziness and headaches.  Hematological: Does not bruise/bleed easily.  Psychiatric/Behavioral: Positive for sleep disturbance. Negative for agitation, behavioral problems (depression) and hallucinations. The patient is nervous/anxious.     Vital Signs: BP  (!) 157/93   Pulse 81   Resp 16   Ht 5' (1.524 m)   Wt 189 lb (85.7 kg)   SpO2 97%   BMI 36.91 kg/m    Physical Exam Constitutional:      General: She is not in acute distress.    Appearance: She is well-developed. She is not diaphoretic.  HENT:     Head: Normocephalic and atraumatic.     Mouth/Throat:     Pharynx: No oropharyngeal exudate.  Eyes:     Pupils: Pupils are  equal, round, and reactive to light.  Neck:     Musculoskeletal: Normal range of motion and neck supple.     Thyroid: No thyromegaly.     Vascular: No JVD.     Trachea: No tracheal deviation.  Cardiovascular:     Rate and Rhythm: Normal rate and regular rhythm.     Heart sounds: Normal heart sounds. No murmur. No friction rub. No gallop.   Pulmonary:     Effort: Pulmonary effort is normal. No respiratory distress.     Breath sounds: Rales present. No wheezing.  Chest:     Chest wall: No tenderness.  Abdominal:     General: Bowel sounds are normal.     Palpations: Abdomen is soft.  Musculoskeletal: Normal range of motion.        General: Tenderness (left shoulder ) present.  Lymphadenopathy:     Cervical: No cervical adenopathy.  Skin:    General: Skin is warm and dry.  Neurological:     Mental Status: She is alert and oriented to person, place, and time.     Cranial Nerves: No cranial nerve deficit.  Psychiatric:        Behavior: Behavior normal.        Thought Content: Thought content normal.        Judgment: Judgment normal.    Assessment/Plan: 1. Uncontrolled type 2 diabetes mellitus with hyperglycemia (HCC) - Pt is instructed to follow her diet carefully, risk of hypoglycemia, will not increase meds at this time  - Ambulatory referral to Ophthalmology - POCT HgB A1C  2. High risk medication use - Refilled klonopine and hydrocodone  - POCT Urine Drug Screen  3. Chronic pain disorder - HYDROcodone-acetaminophen (NORCO/VICODIN) 5-325 MG tablet; Take one tab po bid prn for back pain   Dispense: 60 tablet; Refill: 0  4. Non-small cell cancer of left lung ([place]) - Finished her radiation treatment, will be followed by oncology   5. Generalized anxiety disorder - Continue Klonopin as before   6. Supplemental oxygen dependent - Continue home O2  7. Chronic obstructive pulmonary disease, unspecified COPD type ([place]) - Stable   8. Essential hypertension, benign - Slightly elevated, in pain and feels anxious, will continue to monitor at home   General Counseling: mackinley cassaday understanding of the findings of todays visit and agrees with plan of treatment. I have discussed any further diagnostic evaluation that may be needed or ordered today. We also reviewed her medications today. she has been encouraged to call the office with any questions or concerns that should arise related to todays visit.    Orders Placed This Encounter  Procedures  . POCT Urine Drug Screen    Meds ordered this encounter  Medications  . HYDROcodone-acetaminophen (NORCO/VICODIN) 5-325 MG tablet    Sig: Take one tab po bid prn for back pain    Dispense:  60 tablet    Refill:  0    Time spent:25 Minutes   Dr [name] Internal medicine

## 2018-09-01 ENCOUNTER — Other Ambulatory Visit: Payer: Self-pay | Admitting: Internal Medicine

## 2018-09-01 DIAGNOSIS — I10 Essential (primary) hypertension: Secondary | ICD-10-CM

## 2018-09-01 DIAGNOSIS — J4520 Mild intermittent asthma, uncomplicated: Secondary | ICD-10-CM

## 2018-09-01 DIAGNOSIS — F411 Generalized anxiety disorder: Secondary | ICD-10-CM

## 2018-09-01 DIAGNOSIS — E1165 Type 2 diabetes mellitus with hyperglycemia: Secondary | ICD-10-CM

## 2018-09-01 MED ORDER — ATENOLOL 25 MG PO TABS: 25.0000 mg | ORAL_TABLET | Freq: Every day | ORAL | 3 refills | Status: DC

## 2018-09-01 MED ORDER — GLIMEPIRIDE 2 MG PO TABS: 2.0000 mg | ORAL_TABLET | Freq: Every day | ORAL | 3 refills | Status: DC

## 2018-09-01 MED ORDER — CLONAZEPAM 1 MG PO TABS: ORAL_TABLET | ORAL | 3 refills | Status: DC

## 2018-09-01 MED ORDER — ACCU-CHEK FASTCLIX LANCETS MISC: 3 refills | Status: DC

## 2018-09-01 MED ORDER — SPIRIVA HANDIHALER 18 MCG IN CAPS: ORAL_CAPSULE | RESPIRATORY_TRACT | 3 refills | Status: DC

## 2018-09-01 MED ORDER — PANTOPRAZOLE SODIUM 40 MG PO TBEC: 40.0000 mg | DELAYED_RELEASE_TABLET | Freq: Every day | ORAL | 3 refills | Status: DC

## 2018-09-01 MED ORDER — ALBUTEROL SULFATE HFA 108 (90 BASE) MCG/ACT IN AERS: INHALATION_SPRAY | RESPIRATORY_TRACT | 3 refills | Status: DC

## 2018-09-01 MED ORDER — LOSARTAN POTASSIUM-HCTZ 50-12.5 MG PO TABS: 1.0000 | ORAL_TABLET | Freq: Every day | ORAL | 3 refills | Status: DC

## 2018-09-01 MED ORDER — JANUMET XR 50-500 MG PO TB24: ORAL_TABLET | ORAL | 3 refills | Status: DC

## 2018-09-04 ENCOUNTER — Encounter: Payer: Self-pay | Admitting: *Deleted

## 2018-09-05 DIAGNOSIS — J449 Chronic obstructive pulmonary disease, unspecified: Secondary | ICD-10-CM | POA: Diagnosis not present

## 2018-09-13 ENCOUNTER — Other Ambulatory Visit: Payer: Self-pay

## 2018-09-13 ENCOUNTER — Telehealth: Payer: Self-pay

## 2018-09-13 NOTE — Telephone Encounter (Signed)
lmom we send albuterol pres

## 2018-09-22 ENCOUNTER — Other Ambulatory Visit: Payer: Self-pay

## 2018-09-22 DIAGNOSIS — G894 Chronic pain syndrome: Secondary | ICD-10-CM

## 2018-09-22 MED ORDER — HYDROCODONE-ACETAMINOPHEN 5-325 MG PO TABS: ORAL_TABLET | ORAL | 0 refills | Status: DC

## 2018-10-06 DIAGNOSIS — J449 Chronic obstructive pulmonary disease, unspecified: Secondary | ICD-10-CM | POA: Diagnosis not present

## 2018-10-16 ENCOUNTER — Other Ambulatory Visit: Payer: Self-pay | Admitting: Oncology

## 2018-10-18 ENCOUNTER — Ambulatory Visit
Admission: RE | Admit: 2018-10-18 | Discharge: 2018-10-18 | Disposition: A | Discharge status: Home / Self Care | Payer: Medicare Other | Source: Ambulatory Visit | Attending: Oncology | Admitting: Oncology

## 2018-10-18 ENCOUNTER — Other Ambulatory Visit: Payer: Self-pay

## 2018-10-18 ENCOUNTER — Ambulatory Visit: Payer: Medicare Other | Admitting: Adult Health

## 2018-10-18 DIAGNOSIS — R911 Solitary pulmonary nodule: Secondary | ICD-10-CM | POA: Diagnosis not present

## 2018-10-18 DIAGNOSIS — I700 Atherosclerosis of aorta: Secondary | ICD-10-CM | POA: Diagnosis not present

## 2018-10-18 DIAGNOSIS — J9809 Other diseases of bronchus, not elsewhere classified: Secondary | ICD-10-CM | POA: Diagnosis not present

## 2018-10-18 DIAGNOSIS — J432 Centrilobular emphysema: Secondary | ICD-10-CM | POA: Diagnosis not present

## 2018-10-18 DIAGNOSIS — K8689 Other specified diseases of pancreas: Secondary | ICD-10-CM | POA: Diagnosis not present

## 2018-10-18 HISTORY — DX: Malignant (primary) neoplasm, unspecified: C80.1

## 2018-10-18 LAB — POCT I-STAT CREATININE: Creatinine, Ser: 0.9 mg/dL (ref 0.44–1.00)

## 2018-10-18 MED ORDER — IOHEXOL 300 MG/ML  SOLN
75.0000 mL | Freq: Once | INTRAMUSCULAR | Status: AC | PRN
  Administered 2018-10-18: 75 mL via INTRAVENOUS

## 2018-10-19 ENCOUNTER — Ambulatory Visit: Payer: Medicare Other | Admitting: Oncology

## 2018-10-19 ENCOUNTER — Ambulatory Visit: Payer: Medicare Other | Admitting: Radiation Oncology

## 2018-10-24 ENCOUNTER — Inpatient Hospital Stay: Payer: Medicare Other | Attending: Oncology | Admitting: Oncology

## 2018-10-24 ENCOUNTER — Encounter: Payer: Self-pay | Admitting: Oncology

## 2018-10-24 ENCOUNTER — Other Ambulatory Visit: Payer: Self-pay

## 2018-10-24 VITALS — BP 137/85 | HR 88 | Temp 98.4°F | Resp 18 | Wt 180.6 lb

## 2018-10-24 DIAGNOSIS — I10 Essential (primary) hypertension: Secondary | ICD-10-CM | POA: Insufficient documentation

## 2018-10-24 DIAGNOSIS — Z87891 Personal history of nicotine dependence: Secondary | ICD-10-CM | POA: Insufficient documentation

## 2018-10-24 DIAGNOSIS — E119 Type 2 diabetes mellitus without complications: Secondary | ICD-10-CM | POA: Diagnosis not present

## 2018-10-24 DIAGNOSIS — Z08 Encounter for follow-up examination after completed treatment for malignant neoplasm: Secondary | ICD-10-CM

## 2018-10-24 DIAGNOSIS — Z801 Family history of malignant neoplasm of trachea, bronchus and lung: Secondary | ICD-10-CM | POA: Diagnosis not present

## 2018-10-24 DIAGNOSIS — C3412 Malignant neoplasm of upper lobe, left bronchus or lung: Secondary | ICD-10-CM | POA: Diagnosis not present

## 2018-10-24 DIAGNOSIS — J449 Chronic obstructive pulmonary disease, unspecified: Secondary | ICD-10-CM | POA: Diagnosis not present

## 2018-10-24 DIAGNOSIS — Z79899 Other long term (current) drug therapy: Secondary | ICD-10-CM | POA: Diagnosis not present

## 2018-10-24 DIAGNOSIS — Z85118 Personal history of other malignant neoplasm of bronchus and lung: Secondary | ICD-10-CM

## 2018-10-24 DIAGNOSIS — Z7984 Long term (current) use of oral hypoglycemic drugs: Secondary | ICD-10-CM | POA: Diagnosis not present

## 2018-10-24 DIAGNOSIS — Z800 Family history of malignant neoplasm of digestive organs: Secondary | ICD-10-CM | POA: Insufficient documentation

## 2018-10-24 DIAGNOSIS — Z803 Family history of malignant neoplasm of breast: Secondary | ICD-10-CM | POA: Diagnosis not present

## 2018-10-24 NOTE — Progress Notes (Signed)
Hematology/Oncology Consult note [facility]  [phone] [phone]  Patient Care Team: [name], MD as PCP - General (Internal Medicine) Telford Nab, RN as Registered Nurse   Name of the patient: Lynn Robbins  [id number]  Jun 24, 1940   Date of visit: 10/24/18  Diagnosis-clinical stage I lung cancer left upper lobe cT2 N0 M0  Chief complaint/ Reason for visit-discuss CT scan results  Heme/Onc history: patient is a 78 year old female with a past medical history significant for COPD for which she is on continuous oxygen 2 L, hypertension, CHF who presented to the ER on 04/10/2018 with some symptoms of chest wall pain which led to CT chest and CT abdomen.  CT scan showed left upper lobe spiculated mass highly concerning for primary lung cancer.  There was also a second nodule in the right upper lobe which may represent synchronous tumor.  No significant mediastinal adenopathy.  This was followed by a PET CT scan on 04/14/2018 which showed hypermetabolic left upper lobe lung mass 3.5 x 2.3 cm with an SUV of 13.5.  Second nodule in the right upper lobe measuring 14 mm which did not show any hypermetabolism.  No hypermetabolic mediastinal lymph nodes.  Patient is here to discuss further management.  She reports feeling short of breath with minimal exertion.  She sees Dr. Clayborn Bigness and also has Dr. Bea Laura who is her pulmonologist but reports that she has not seen him in a long time.  She reports feeling fatigued and has chronic back pain.  Multiple family members have had lung cancer.  She smokes about 3 packs of cigarettes per day and has been doing so over 50 years.   Interval history-reports that she has been having some nausea since yesterday and had an episode of vomiting this morning.  She has chronic shortness of breath on exertion which is unchanged.  ECOG PS- 2 Pain scale- 0   Review of systems- Review of Systems   Constitutional: Positive for malaise/fatigue. Negative for chills, fever and weight loss.  HENT: Negative for congestion, ear discharge and nosebleeds.   Eyes: Negative for blurred vision.  Respiratory: Positive for shortness of breath. Negative for cough, hemoptysis, sputum production and wheezing.   Cardiovascular: Negative for chest pain, palpitations, orthopnea and claudication.  Gastrointestinal: Positive for nausea and vomiting. Negative for abdominal pain, blood in stool, constipation, diarrhea, heartburn and melena.  Genitourinary: Negative for dysuria, flank pain, frequency, hematuria and urgency.  Musculoskeletal: Negative for back pain, joint pain and myalgias.  Skin: Negative for rash.  Neurological: Negative for dizziness, tingling, focal weakness, seizures, weakness and headaches.  Endo/Heme/Allergies: Does not bruise/bleed easily.  Psychiatric/Behavioral: Negative for depression and suicidal ideas. The patient does not have insomnia.       No Known Allergies   Past Medical History:  Diagnosis Date   Cancer ([place])    CHF (congestive heart failure) (HCC)    COPD (chronic obstructive pulmonary disease) (HCC)    Depression    Diabetes mellitus without complication (HCC)    HOH (hard of hearing)    Hypertension      Past Surgical History:  Procedure Laterality Date   CESAREAN SECTION      Social History   Socioeconomic History   Marital status: Single    Spouse name: Not on file   Number of children: 5   Years of education: Not on file   Highest education level: Not on file  Occupational History  Not on file  Social Needs   Financial resource strain: Not on file   Food insecurity    Worry: Not on file    Inability: Not on file   Transportation needs    Medical: Not on file    Non-medical: Not on file  Tobacco Use   Smoking status: Former Smoker    Packs/day: 3.00    Years: 57.00    Pack years: 171.00    Types: Cigarettes    Quit  date: 04/17/1998    Years since quitting: 20.5   Smokeless tobacco: Current User    Types: Snuff  Substance and Sexual Activity   Alcohol use: No   Drug use: No   Sexual activity: Never  Lifestyle   Physical activity    Days per week: Not on file    Minutes per session: Not on file   Stress: Not on file  Relationships   Social connections    Talks on phone: Not on file    Gets together: Not on file    Attends religious service: Not on file    Active member of club or organization: Not on file    Attends meetings of clubs or organizations: Not on file    Relationship status: Not on file   Intimate partner violence    Fear of current or ex partner: Not on file    Emotionally abused: Not on file    Physically abused: Not on file    Forced sexual activity: Not on file  Other Topics Concern   Not on file  Social History Narrative   ** Merged History Encounter **        Family History  Problem Relation Age of Onset   Diabetes Father    Colon cancer Father    Colon cancer Mother    Aneurysm Mother 35       brain   Lung cancer Sister    Breast cancer Sister    Lung cancer Brother    Lung cancer Sister    Bone cancer Brother    Lung cancer Brother    Cancer Sister        type unknown     Current Outpatient Medications:    Accu-Chek FastClix Lancets MISC, Use as directed to check sugars. DX E11.65, Disp: 306 each, Rfl: 3   albuterol (PROAIR HFA) 108 (90 Base) MCG/ACT inhaler, 2 PUFFS EVERY 4 HOURS AS NEEDED, Disp: 54 g, Rfl: 3   atenolol (TENORMIN) 25 MG tablet, Take 1 tablet (25 mg total) by mouth daily., Disp: 90 tablet, Rfl: 3   clonazePAM (KLONOPIN) 1 MG tablet, Take one tab po bid for anxiety, Disp: 180 tablet, Rfl: 3   glimepiride (AMARYL) 2 MG tablet, Take 1 tablet (2 mg total) by mouth daily with breakfast., Disp: 90 tablet, Rfl: 3   HYDROcodone-acetaminophen (NORCO/VICODIN) 5-325 MG tablet, Take one tab po bid prn for back pain, Disp: 60  tablet, Rfl: 0   losartan-hydrochlorothiazide (HYZAAR) 50-12.5 MG tablet, Take 1 tablet by mouth daily., Disp: 90 tablet, Rfl: 3   OXYGEN, Inhale into the lungs., Disp: , Rfl:    SitaGLIPtin-MetFORMIN HCl (JANUMET XR) 50-500 MG TB24, TAKE ONE TAB A DAY WITH FOOD FOR DIABETES, Disp: 90 tablet, Rfl: 3   tiotropium (SPIRIVA HANDIHALER) 18 MCG inhalation capsule, INHALE 1 CAPSULE VIA HANDIHALER ONCE DAILY AT THE SAME TIME EVERY DAY, Disp: 90 capsule, Rfl: 3   pantoprazole (PROTONIX) 40 MG tablet, Take 1 tablet (40 mg total)  by mouth daily. (Patient not taking: Reported on 10/24/2018), Disp: 90 tablet, Rfl: 3  Physical exam:  Vitals:   10/24/18 1026  BP: 137/85  Pulse: 88  Resp: 18  Temp: 98.4 F (36.9 C)  TempSrc: Tympanic  SpO2: 99%  Weight: 180 lb 9.6 oz (81.9 kg)   Physical Exam Constitutional:      Comments: She is obese and sitting in a wheelchair.  Appears in no acute distress.  Strong smell of tobacco around her.  She is on home oxygen  HENT:     Head: Normocephalic and atraumatic.  Eyes:     Pupils: Pupils are equal, round, and reactive to light.  Neck:     Musculoskeletal: Normal range of motion.  Cardiovascular:     Rate and Rhythm: Normal rate and regular rhythm.     Heart sounds: Normal heart sounds.  Pulmonary:     Comments: Breath sounds decreased bilaterally Abdominal:     General: Bowel sounds are normal.     Palpations: Abdomen is soft.  Skin:    General: Skin is warm and dry.  Neurological:     Mental Status: She is alert and oriented to person, place, and time.      CMP Latest Ref Rng & Units 10/18/2018  Glucose 70 - 99 mg/dL -  BUN 8 - 23 mg/dL -  Creatinine 0.44 - 1.00 mg/dL 0.90  Sodium 135 - 145 mmol/L -  Potassium 3.5 - 5.1 mmol/L -  Chloride 98 - 111 mmol/L -  CO2 22 - 32 mmol/L -  Calcium 8.9 - 10.3 mg/dL -  Total Protein 6.5 - 8.1 g/dL -  Total Bilirubin 0.3 - 1.2 mg/dL -  Alkaline Phos 38 - 126 U/L -  AST 15 - 41 U/L -  ALT 0 - 44 U/L -    CBC Latest Ref Rng & Units 04/10/2018  WBC 4.0 - 10.5 K/uL 7.1  Hemoglobin 12.0 - 15.0 g/dL 10.0(L)  Hematocrit 36.0 - 46.0 % 34.9(L)  Platelets 150 - 400 K/uL 224    No images are attached to the encounter.  Ct Chest W Contrast  Result Date: 10/18/2018 CLINICAL DATA:  78 year old female with history of shortness of breath. History of COPD. EXAM: CT CHEST WITH CONTRAST TECHNIQUE: Multidetector CT imaging of the chest was performed during intravenous contrast administration. CONTRAST:  41mL OMNIPAQUE IOHEXOL 300 MG/ML  SOLN COMPARISON:  Chest CT 04/10/2018. FINDINGS: Cardiovascular: Heart size is normal. There is no significant pericardial fluid, thickening or pericardial calcification. There is aortic atherosclerosis, as well as atherosclerosis of the great vessels of the mediastinum and the coronary arteries, including calcified atherosclerotic plaque in the left main, left anterior descending and right coronary arteries. Calcifications of the aortic valve. Mediastinum/Nodes: No pathologically enlarged mediastinal or hilar lymph nodes. Esophagus is unremarkable in appearance. No axillary lymphadenopathy. Lungs/Pleura: Previously noted left upper lobe mass has decreased in size compared to the prior study, currently measuring 2.7 x 1.6 x 1.9 cm (axial image 37 of series 3 and coronal image 88 of series 4), decreased from 2.1 x 3.5 x 1.9 cm on prior study 04/10/2018. Previously noted nodular area of architectural distortion in the right upper lobe (axial image 59 of series 3) is stable compared to the prior examination, and was previously not hypermetabolic on PET-CT 53/61/4431. No other new suspicious appearing pulmonary nodules or masses are noted. No acute consolidative airspace disease. No pleural effusions. Diffuse bronchial wall thickening with moderate centrilobular and paraseptal emphysema. Scattered  peribronchovascular micro and macronodularity in the right lung, similar to prior examination,  most evident in the right upper lobe, likely to reflect areas of chronic mucoid impaction within terminal bronchioles. Upper Abdomen: Subcentimeter low-attenuation lesions in the left kidney, too small to characterize, but similar to the prior study and statistically likely to represent tiny cysts. Multiple adrenal nodules bilaterally, stable in size and number to the prior study, incompletely characterized on today's examination, but favored to be benign (likely small adenomas), largest of which is in the left adrenal gland measuring 1.6 x 1.3 cm (axial image 132 of series 2). Chronic diffuse pancreatic atrophy with diffuse pancreatic ductal dilatation and multiple cystic lesions scattered throughout the pancreas, incompletely imaged, but similar to the prior examination 04/10/2018, presumably cysts sequela of chronic pancreatitis and chronic pancreatic ductal dilatation. Musculoskeletal: There are no aggressive appearing lytic or blastic lesions noted in the visualized portions of the skeleton. IMPRESSION: 1. Today's study demonstrates a positive response to therapy with decreased size of left upper lobe nodule which currently measures only 2.7 x 1.6 x 1.9 cm. 2. Diffuse bronchial wall thickening with moderate centrilobular and paraseptal emphysema; imaging findings suggestive of underlying COPD. 3. Scattered areas of mucoid impaction within terminal bronchioles, similar to prior examination, considered benign. 4. Aortic atherosclerosis, in addition to left main and 2 vessel coronary artery disease. Assessment for potential risk factor modification, dietary therapy or pharmacologic therapy may be warranted, if clinically indicated. 5. There are calcifications of the aortic valve. Echocardiographic correlation for evaluation of potential valvular dysfunction may be warranted if clinically indicated. 6. Additional incidental findings, as above. Aortic Atherosclerosis (ICD10-I70.0) and Emphysema (ICD10-J43.9).  Electronically Signed   By: Vinnie Langton M.D.   On: 10/18/2018 11:21     Assessment and plan- Patient is a 78 y.o. female with presumed diagnosis of left upper lobe lung cancer stage I cT1b cN0 cM0 based on PET CT scan status post SBRT.  She is here to discuss the results of CT chest and further management  I have reviewed CT chest images independently and discussed findings with the patient. Overall the size of her left upper lobe lung nodule is smaller after SBRT.  She has evidence of chronic lung disease but no progressive adenopathy was seen on today's exam.  Overall she was a poor candidate for biopsy or surgery.  I will plan to get repeat CT chest abdomen pelvis with contrast 6 months from now as a part of surveillance imaging and see her thereafter  Acute onset nausea and vomiting: Patient is unsure if she ate something unusual which could have triggered this.  She does have an appointment with Dr. Humphrey Rolls tomorrow     Visit Diagnosis 1. Encounter for follow-up surveillance of lung cancer      Dr. Randa Evens, MD, MPH [facility] [phone] 10/24/2018 1:20 PM

## 2018-10-25 ENCOUNTER — Encounter: Payer: Self-pay | Admitting: Adult Health

## 2018-10-25 ENCOUNTER — Ambulatory Visit (INDEPENDENT_AMBULATORY_CARE_PROVIDER_SITE_OTHER): Payer: Medicare Other | Admitting: Adult Health

## 2018-10-25 VITALS — BP 142/86 | HR 85 | Resp 16 | Ht 60.0 in | Wt 187.0 lb

## 2018-10-25 DIAGNOSIS — J449 Chronic obstructive pulmonary disease, unspecified: Secondary | ICD-10-CM | POA: Diagnosis not present

## 2018-10-25 DIAGNOSIS — Z9981 Dependence on supplemental oxygen: Secondary | ICD-10-CM

## 2018-10-25 DIAGNOSIS — G894 Chronic pain syndrome: Secondary | ICD-10-CM | POA: Diagnosis not present

## 2018-10-25 DIAGNOSIS — C3492 Malignant neoplasm of unspecified part of left bronchus or lung: Secondary | ICD-10-CM

## 2018-10-25 MED ORDER — HYDROCODONE-ACETAMINOPHEN 5-325 MG PO TABS: ORAL_TABLET | ORAL | 0 refills | Status: DC

## 2018-10-25 NOTE — Progress Notes (Signed)
[facility] [place], [place] [address]  Internal MEDICINE  Office Visit Note  Patient Name: Lynn Robbins  [id number]  [id number]  Date of Service: 10/25/2018  Chief Complaint  Patient presents with  . Medical Management of Chronic Issues    2 month follow up   . Diabetes    HPI  Pt is here for two month follow up on DM. Overall she is doing well. She denies any pain or issues at this time. She continues to report chronic back pain, and is requesting a refill on her hydrocodone.      Current Medication: Outpatient Encounter Medications as of 10/25/2018  Medication Sig  . Accu-Chek FastClix Lancets MISC Use as directed to check sugars. DX E11.65  . albuterol (PROAIR HFA) 108 (90 Base) MCG/ACT inhaler 2 PUFFS EVERY 4 HOURS AS NEEDED  . atenolol (TENORMIN) 25 MG tablet Take 1 tablet (25 mg total) by mouth daily.  . clonazePAM (KLONOPIN) 1 MG tablet Take one tab po bid for anxiety  . glimepiride (AMARYL) 2 MG tablet Take 1 tablet (2 mg total) by mouth daily with breakfast.  . HYDROcodone-acetaminophen (NORCO/VICODIN) 5-325 MG tablet Take one tab po bid prn for back pain  . losartan-hydrochlorothiazide (HYZAAR) 50-12.5 MG tablet Take 1 tablet by mouth daily.  . OXYGEN Inhale into the lungs.  . SitaGLIPtin-MetFORMIN HCl (JANUMET XR) 50-500 MG TB24 TAKE ONE TAB A DAY WITH FOOD FOR DIABETES  . tiotropium (SPIRIVA HANDIHALER) 18 MCG inhalation capsule INHALE 1 CAPSULE VIA HANDIHALER ONCE DAILY AT THE SAME TIME EVERY DAY  . pantoprazole (PROTONIX) 40 MG tablet Take 1 tablet (40 mg total) by mouth daily. (Patient not taking: Reported on 10/25/2018)   No facility-administered encounter medications on file as of 10/25/2018.     Surgical History: Past Surgical History:  Procedure Laterality Date  . CESAREAN SECTION      Medical History: Past Medical History:  Diagnosis Date  . Cancer ([place])   . CHF (congestive heart failure) ([place])   . COPD (chronic obstructive  pulmonary disease) ([place])   . Depression   . Diabetes mellitus without complication ([place])   . HOH (hard of hearing)   . Hypertension     Family History: Family History  Problem Relation Age of Onset  . Diabetes Father   . Colon cancer Father   . Colon cancer Mother   . Aneurysm Mother 39       brain  . Lung cancer Sister   . Breast cancer Sister   . Lung cancer Brother   . Lung cancer Sister   . Bone cancer Brother   . Lung cancer Brother   . Cancer Sister        type unknown    Social History   Socioeconomic History  . Marital status: Single    Spouse name: Not on file  . Number of children: 5  . Years of education: Not on file  . Highest education level: Not on file  Occupational History  . Not on file  Social Needs  . Financial resource strain: Not on file  . Food insecurity    Worry: Not on file    Inability: Not on file  . Transportation needs    Medical: Not on file    Non-medical: Not on file  Tobacco Use  . Smoking status: Former Smoker    Packs/day: 3.00    Years: 57.00    Pack years: 171.00    Types:  Cigarettes    Quit date: 04/17/1998    Years since quitting: 20.5  . Smokeless tobacco: Current User    Types: Snuff  Substance and Sexual Activity  . Alcohol use: No  . Drug use: No  . Sexual activity: Never  Lifestyle  . Physical activity    Days per week: Not on file    Minutes per session: Not on file  . Stress: Not on file  Relationships  . Social Herbalist on phone: Not on file    Gets together: Not on file    Attends religious service: Not on file    Active member of club or organization: Not on file    Attends meetings of clubs or organizations: Not on file    Relationship status: Not on file  . Intimate partner violence    Fear of current or ex partner: Not on file    Emotionally abused: Not on file    Physically abused: Not on file    Forced sexual activity: Not on file  Other Topics Concern  . Not on file  Social  History Narrative   ** Merged History Encounter **          Review of Systems  Constitutional: Negative for chills, fatigue and unexpected weight change.  HENT: Negative for congestion, rhinorrhea, sneezing and sore throat.   Eyes: Negative for photophobia, pain and redness.  Respiratory: Negative for cough, chest tightness and shortness of breath.   Cardiovascular: Negative for chest pain and palpitations.  Gastrointestinal: Negative for abdominal pain, constipation, diarrhea, nausea and vomiting.  Endocrine: Negative.   Genitourinary: Negative for dysuria and frequency.  Musculoskeletal: Negative for arthralgias, back pain, joint swelling and neck pain.  Skin: Negative for rash.  Allergic/Immunologic: Negative.   Neurological: Negative for tremors and numbness.  Hematological: Negative for adenopathy. Does not bruise/bleed easily.  Psychiatric/Behavioral: Negative for behavioral problems and sleep disturbance. The patient is not nervous/anxious.     Vital Signs: BP (!) 142/86   Pulse 85   Resp 16   Ht 5' (1.524 m)   Wt 187 lb (84.8 kg)   SpO2 97%   BMI 36.52 kg/m    Physical Exam Vitals signs and nursing note reviewed.  Constitutional:      General: She is not in acute distress.    Appearance: She is well-developed. She is not diaphoretic.  HENT:     Head: Normocephalic and atraumatic.     Mouth/Throat:     Pharynx: No oropharyngeal exudate.  Eyes:     Pupils: Pupils are equal, round, and reactive to light.  Neck:     Musculoskeletal: Normal range of motion and neck supple.     Thyroid: No thyromegaly.     Vascular: No JVD.     Trachea: No tracheal deviation.  Cardiovascular:     Rate and Rhythm: Normal rate and regular rhythm.     Heart sounds: Normal heart sounds. No murmur. No friction rub. No gallop.   Pulmonary:     Effort: Pulmonary effort is normal. No respiratory distress.     Breath sounds: Normal breath sounds. No wheezing or rales.  Chest:      Chest wall: No tenderness.  Abdominal:     Palpations: Abdomen is soft.     Tenderness: There is no abdominal tenderness. There is no guarding.  Musculoskeletal: Normal range of motion.  Lymphadenopathy:     Cervical: No cervical adenopathy.  Skin:    General:  Skin is warm and dry.  Neurological:     Mental Status: She is alert and oriented to person, place, and time.     Cranial Nerves: No cranial nerve deficit.  Psychiatric:        Behavior: Behavior normal.        Thought Content: Thought content normal.        Judgment: Judgment normal.    Assessment/Plan: 1. Chronic pain disorder Reviewed risks and possible side effects associated with taking opiates, benzodiazepines and other CNS depressants. Combination of these could cause dizziness and drowsiness. Advised patient not to drive or operate machinery when taking these medications, as patient's and other's life can be at risk and will have consequences. Patient verbalized understanding in this matter. Dependence and abuse for these drugs will be monitored closely. A Controlled substance policy and procedure is on file which allows [facility] to order a urine drug screen test at any visit. Patient understands and agrees with the plan - HYDROcodone-acetaminophen (NORCO/VICODIN) 5-325 MG tablet; Take one tab po bid prn for back pain  Dispense: 60 tablet; Refill: 0  2. Non-small cell cancer of left lung ([place]) Shrinking per her last CT scan.  Continue to follow up with oncology as scheduled.   3. Chronic obstructive pulmonary disease, unspecified COPD type ([place]) Stable, continue current therapy.   4. Supplemental oxygen dependent Continue oxygen as directed.    General Counseling: adylin hankey understanding of the findings of todays visit and agrees with plan of treatment. I have discussed any further diagnostic evaluation that may be needed or ordered today. We also reviewed her medications today. she has been  encouraged to call the office with any questions or concerns that should arise related to todays visit.    No orders of the defined types were placed in this encounter.   No orders of the defined types were placed in this encounter.   Time spent: 20 Minutes   This patient was seen by Orson Gear AGNP-C in Collaboration with Dr [name] as a part of collaborative care agreement     [name] AGNP-C Internal medicine

## 2018-11-06 DIAGNOSIS — J449 Chronic obstructive pulmonary disease, unspecified: Secondary | ICD-10-CM | POA: Diagnosis not present

## 2018-11-15 ENCOUNTER — Other Ambulatory Visit: Payer: Self-pay

## 2018-11-16 ENCOUNTER — Other Ambulatory Visit: Payer: Self-pay

## 2018-11-16 ENCOUNTER — Ambulatory Visit
Admission: RE | Admit: 2018-11-16 | Discharge: 2018-11-16 | Disposition: A | Discharge status: Home / Self Care | Payer: Medicare Other | Source: Ambulatory Visit | Attending: Radiation Oncology | Admitting: Radiation Oncology

## 2018-11-16 ENCOUNTER — Encounter: Payer: Self-pay | Admitting: Radiation Oncology

## 2018-11-16 ENCOUNTER — Ambulatory Visit: Payer: Medicare Other | Admitting: Adult Health

## 2018-11-16 VITALS — BP 145/78 | HR 82 | Temp 99.6°F

## 2018-11-16 DIAGNOSIS — Z87891 Personal history of nicotine dependence: Secondary | ICD-10-CM | POA: Insufficient documentation

## 2018-11-16 DIAGNOSIS — C3412 Malignant neoplasm of upper lobe, left bronchus or lung: Secondary | ICD-10-CM | POA: Insufficient documentation

## 2018-11-16 DIAGNOSIS — Z923 Personal history of irradiation: Secondary | ICD-10-CM | POA: Insufficient documentation

## 2018-11-16 DIAGNOSIS — R918 Other nonspecific abnormal finding of lung field: Secondary | ICD-10-CM

## 2018-11-16 NOTE — Progress Notes (Signed)
Radiation Oncology Follow up Note  Name: Lynn Robbins   Date:   11/16/2018 MRN:  [id number] DOB: 1940-05-13    This 78 y.o. female presents to the clinic today for 71-month follow-up status post SBRT for stage Ib (T2 a N0 M0) non-small cell lung cancer of the left upper lobe.  REFERRING PROVIDER: [name], MD  HPI: Patient is a 78 year old female wheelchair-bound on nasal oxygen who is now 4 months out having completed SBRT to her left upper lobe for stage Ib non-small cell lung cancer seen today in routine follow-up she is doing well specifically denies productive cough hemoptysis or chest tightness.  She had a recent CT scan.  Showing positive response to therapy with decrease size in left upper lobe mass now measuring 2.7 cm in greatest dimension.  COMPLICATIONS OF TREATMENT: none  FOLLOW UP COMPLIANCE: keeps appointments   PHYSICAL EXAM:  BP (!) 145/78   Pulse 82   Temp 99.6 F (37.6 C)  Wheelchair-bound female in NAD on nasal oxygen well-developed well-nourished patient in NAD. HEENT reveals PERLA, EOMI, discs not visualized.  Oral cavity is clear. No oral mucosal lesions are identified. Neck is clear without evidence of cervical or supraclavicular adenopathy. Lungs are clear to A&P. Cardiac examination is essentially unremarkable with regular rate and rhythm without murmur rub or thrill. Abdomen is benign with no organomegaly or masses noted. Motor sensory and DTR levels are equal and symmetric in the upper and lower extremities. Cranial nerves II through XII are grossly intact. Proprioception is intact. No peripheral adenopathy or edema is identified. No motor or sensory levels are noted. Crude visual fields are within normal range.  RADIOLOGY RESULTS: CT scan reviewed compatible with above-stated findings  PLAN: Present time patient is doing well with improvement by CT criteria and pleased with her overall progress.  I have asked to see her back in 6 months with a CT scan  prior to her next visit.  She continues close follow-up care with Dr. Chancy Milroy.  Patient knows to call with any concerns at any time.  I would like to take this opportunity to thank you for allowing me to participate in the care of your patient.Noreene Filbert, MD

## 2018-11-22 ENCOUNTER — Ambulatory Visit (INDEPENDENT_AMBULATORY_CARE_PROVIDER_SITE_OTHER): Payer: Medicare Other | Admitting: Adult Health

## 2018-11-22 ENCOUNTER — Encounter: Payer: Self-pay | Admitting: Adult Health

## 2018-11-22 ENCOUNTER — Other Ambulatory Visit: Payer: Self-pay

## 2018-11-22 VITALS — BP 153/74 | HR 77 | Temp 98.3°F | Resp 16 | Ht 60.0 in | Wt 190.0 lb

## 2018-11-22 DIAGNOSIS — I10 Essential (primary) hypertension: Secondary | ICD-10-CM | POA: Diagnosis not present

## 2018-11-22 DIAGNOSIS — G894 Chronic pain syndrome: Secondary | ICD-10-CM

## 2018-11-22 DIAGNOSIS — J449 Chronic obstructive pulmonary disease, unspecified: Secondary | ICD-10-CM | POA: Diagnosis not present

## 2018-11-22 DIAGNOSIS — E1165 Type 2 diabetes mellitus with hyperglycemia: Secondary | ICD-10-CM | POA: Diagnosis not present

## 2018-11-22 DIAGNOSIS — C3492 Malignant neoplasm of unspecified part of left bronchus or lung: Secondary | ICD-10-CM

## 2018-11-22 LAB — POCT GLYCOSYLATED HEMOGLOBIN (HGB A1C): Hemoglobin A1C: 6.4 % — AB (ref 4.0–5.6)

## 2018-11-22 NOTE — Progress Notes (Signed)
[facility] [place], [place] [address]  Internal MEDICINE  Office Visit Note  Patient Name: Lynn Robbins  [id number]  [id number]  Date of Service: 11/22/2018  Chief Complaint  Patient presents with  . Diabetes  . Hypertension    HPI Pt is here for follow up on DM, HTN. Pt overall is doing well.  Pt is using oxygen without difficulty.  Her DM and HTN is well controlled at this time.       Current Medication: Outpatient Encounter Medications as of 11/22/2018  Medication Sig  . Accu-Chek FastClix Lancets MISC Use as directed to check sugars. DX E11.65  . albuterol (PROAIR HFA) 108 (90 Base) MCG/ACT inhaler 2 PUFFS EVERY 4 HOURS AS NEEDED  . atenolol (TENORMIN) 25 MG tablet Take 1 tablet (25 mg total) by mouth daily.  . clonazePAM (KLONOPIN) 1 MG tablet Take one tab po bid for anxiety  . glimepiride (AMARYL) 2 MG tablet Take 1 tablet (2 mg total) by mouth daily with breakfast.  . HYDROcodone-acetaminophen (NORCO/VICODIN) 5-325 MG tablet Take one tab po bid prn for back pain  . losartan-hydrochlorothiazide (HYZAAR) 50-12.5 MG tablet Take 1 tablet by mouth daily.  . OXYGEN Inhale into the lungs.  . pantoprazole (PROTONIX) 40 MG tablet Take 1 tablet (40 mg total) by mouth daily.  . SitaGLIPtin-MetFORMIN HCl (JANUMET XR) 50-500 MG TB24 TAKE ONE TAB A DAY WITH FOOD FOR DIABETES  . tiotropium (SPIRIVA HANDIHALER) 18 MCG inhalation capsule INHALE 1 CAPSULE VIA HANDIHALER ONCE DAILY AT THE SAME TIME EVERY DAY   No facility-administered encounter medications on file as of 11/22/2018.     Surgical History: Past Surgical History:  Procedure Laterality Date  . CESAREAN SECTION      Medical History: Past Medical History:  Diagnosis Date  . Cancer ([place])   . CHF (congestive heart failure) ([place])   . COPD (chronic obstructive pulmonary disease) ([place])   . Depression   . Diabetes mellitus without complication ([place])   . HOH (hard of hearing)   . Hypertension      Family History: Family History  Problem Relation Age of Onset  . Diabetes Father   . Colon cancer Father   . Colon cancer Mother   . Aneurysm Mother 63       brain  . Lung cancer Sister   . Breast cancer Sister   . Lung cancer Brother   . Lung cancer Sister   . Bone cancer Brother   . Lung cancer Brother   . Cancer Sister        type unknown    Social History   Socioeconomic History  . Marital status: Single    Spouse name: Not on file  . Number of children: 5  . Years of education: Not on file  . Highest education level: Not on file  Occupational History  . Not on file  Social Needs  . Financial resource strain: Not on file  . Food insecurity    Worry: Not on file    Inability: Not on file  . Transportation needs    Medical: Not on file    Non-medical: Not on file  Tobacco Use  . Smoking status: Former Smoker    Packs/day: 3.00    Years: 57.00    Pack years: 171.00    Types: Cigarettes    Quit date: 04/17/1998    Years since quitting: 20.6  . Smokeless tobacco: Current User    Types: Snuff  Substance and Sexual Activity  . Alcohol use: No  . Drug use: No  . Sexual activity: Never  Lifestyle  . Physical activity    Days per week: Not on file    Minutes per session: Not on file  . Stress: Not on file  Relationships  . Social Herbalist on phone: Not on file    Gets together: Not on file    Attends religious service: Not on file    Active member of club or organization: Not on file    Attends meetings of clubs or organizations: Not on file    Relationship status: Not on file  . Intimate partner violence    Fear of current or ex partner: Not on file    Emotionally abused: Not on file    Physically abused: Not on file    Forced sexual activity: Not on file  Other Topics Concern  . Not on file  Social History Narrative   ** Merged History Encounter **          Review of Systems  Constitutional: Negative for chills, fatigue and  unexpected weight change.  HENT: Negative for congestion, rhinorrhea, sneezing and sore throat.   Eyes: Negative for photophobia, pain and redness.  Respiratory: Negative for cough, chest tightness and shortness of breath.   Cardiovascular: Negative for chest pain and palpitations.  Gastrointestinal: Negative for abdominal pain, constipation, diarrhea, nausea and vomiting.  Endocrine: Negative.   Genitourinary: Negative for dysuria and frequency.  Musculoskeletal: Negative for arthralgias, back pain, joint swelling and neck pain.  Skin: Negative for rash.  Allergic/Immunologic: Negative.   Neurological: Negative for tremors and numbness.  Hematological: Negative for adenopathy. Does not bruise/bleed easily.  Psychiatric/Behavioral: Negative for behavioral problems and sleep disturbance. The patient is not nervous/anxious.     Vital Signs: BP (!) 153/74   Pulse 77   Temp 98.3 F (36.8 C)   Resp 16   Ht 5' (1.524 m)   Wt 190 lb (86.2 kg)   SpO2 98%   BMI 37.11 kg/m    Physical Exam Vitals signs and nursing note reviewed.  Constitutional:      General: She is not in acute distress.    Appearance: She is well-developed. She is not diaphoretic.  HENT:     Head: Normocephalic and atraumatic.     Mouth/Throat:     Pharynx: No oropharyngeal exudate.  Eyes:     Pupils: Pupils are equal, round, and reactive to light.  Neck:     Musculoskeletal: Normal range of motion and neck supple.     Thyroid: No thyromegaly.     Vascular: No JVD.     Trachea: No tracheal deviation.  Cardiovascular:     Rate and Rhythm: Normal rate and regular rhythm.     Heart sounds: Normal heart sounds. No murmur. No friction rub. No gallop.   Pulmonary:     Effort: Pulmonary effort is normal. No respiratory distress.     Breath sounds: Normal breath sounds. No wheezing or rales.  Chest:     Chest wall: No tenderness.  Abdominal:     Palpations: Abdomen is soft.     Tenderness: There is no abdominal  tenderness. There is no guarding.  Musculoskeletal: Normal range of motion.  Lymphadenopathy:     Cervical: No cervical adenopathy.  Skin:    General: Skin is warm and dry.  Neurological:     Mental Status: She is alert and oriented to  person, place, and time.     Cranial Nerves: No cranial nerve deficit.  Psychiatric:        Behavior: Behavior normal.        Thought Content: Thought content normal.        Judgment: Judgment normal.    Assessment/Plan: 1. Type 2 diabetes mellitus with hyperglycemia, unspecified whether long term insulin use (HCC) Alc is 6.4 today, which is down from 6.9.  - POCT HgB A1C  2. Non-small cell cancer of left lung ([place]) Per patient, mostly gone, just a small scar left at the cancer site.   3. Chronic pain disorder Continues to see pain management.   4. Chronic obstructive pulmonary disease, unspecified COPD type ([place]) Controlled, continue to use oxygen continuously, and take medications as directed.   5. Essential hypertension, benign Stable, continue present management.   General Counseling: suad autrey understanding of the findings of todays visit and agrees with plan of treatment. I have discussed any further diagnostic evaluation that may be needed or ordered today. We also reviewed her medications today. she has been encouraged to call the office with any questions or concerns that should arise related to todays visit.    Orders Placed This Encounter  Procedures  . POCT HgB A1C    No orders of the defined types were placed in this encounter.   Time spent: 25 Minutes   This patient was seen by Orson Gear AGNP-C in Collaboration with Dr [name] as a part of collaborative care agreement     [name] AGNP-C Internal medicine

## 2018-11-24 ENCOUNTER — Other Ambulatory Visit: Payer: Self-pay

## 2018-11-24 ENCOUNTER — Other Ambulatory Visit: Payer: Self-pay | Admitting: Adult Health

## 2018-11-24 DIAGNOSIS — J4520 Mild intermittent asthma, uncomplicated: Secondary | ICD-10-CM

## 2018-11-24 DIAGNOSIS — G894 Chronic pain syndrome: Secondary | ICD-10-CM

## 2018-11-24 MED ORDER — ALBUTEROL SULFATE HFA 108 (90 BASE) MCG/ACT IN AERS: INHALATION_SPRAY | RESPIRATORY_TRACT | 3 refills | Status: DC

## 2018-11-24 NOTE — Telephone Encounter (Signed)
It's done and I sent it to the pharmacy.

## 2018-11-24 NOTE — Telephone Encounter (Signed)
Can you send

## 2018-11-24 NOTE — Telephone Encounter (Signed)
Please send her med

## 2018-12-06 DIAGNOSIS — J449 Chronic obstructive pulmonary disease, unspecified: Secondary | ICD-10-CM | POA: Diagnosis not present

## 2018-12-26 ENCOUNTER — Telehealth: Payer: Self-pay

## 2018-12-26 NOTE — Telephone Encounter (Signed)
LEFT MESSAGE FOR PATIENT TO CALL us TO GO OVER SCREENING QUESTIONS AND TO CONFIRM 12-28-18 APPOINTMENT.

## 2018-12-27 ENCOUNTER — Other Ambulatory Visit: Payer: Self-pay | Admitting: Nurse Practitioner

## 2018-12-27 ENCOUNTER — Telehealth: Payer: Self-pay

## 2018-12-27 DIAGNOSIS — G894 Chronic pain syndrome: Secondary | ICD-10-CM

## 2018-12-27 MED ORDER — HYDROCODONE-ACETAMINOPHEN 5-325 MG PO TABS: ORAL_TABLET | ORAL | 0 refills | Status: DC

## 2018-12-27 NOTE — Telephone Encounter (Signed)
Refilled single 30 day prescription for her hydrocodone and [facility]. She has follow up 12/28/2018 which she needs to keep for additional refills.

## 2018-12-27 NOTE — Progress Notes (Signed)
Refilled single 30 day prescription for her hydrocodone and sent to [facility]. She has follow up 12/28/2018 which she needs to keep for additional refills.

## 2018-12-28 ENCOUNTER — Ambulatory Visit: Payer: Self-pay | Admitting: Adult Health

## 2019-01-01 ENCOUNTER — Telehealth: Payer: Self-pay

## 2019-01-01 NOTE — Telephone Encounter (Signed)
Confirmed appointment with patient. klh [email or web address]

## 2019-01-03 ENCOUNTER — Other Ambulatory Visit: Payer: Self-pay

## 2019-01-03 ENCOUNTER — Ambulatory Visit (INDEPENDENT_AMBULATORY_CARE_PROVIDER_SITE_OTHER): Payer: Medicare Other | Admitting: Adult Health

## 2019-01-03 ENCOUNTER — Encounter: Payer: Self-pay | Admitting: Adult Health

## 2019-01-03 VITALS — BP 129/68 | HR 87 | Temp 97.6°F | Resp 16 | Ht 60.0 in | Wt 191.0 lb

## 2019-01-03 DIAGNOSIS — F411 Generalized anxiety disorder: Secondary | ICD-10-CM | POA: Diagnosis not present

## 2019-01-03 DIAGNOSIS — Z0001 Encounter for general adult medical examination with abnormal findings: Secondary | ICD-10-CM

## 2019-01-03 DIAGNOSIS — Z23 Encounter for immunization: Secondary | ICD-10-CM | POA: Diagnosis not present

## 2019-01-03 DIAGNOSIS — I10 Essential (primary) hypertension: Secondary | ICD-10-CM

## 2019-01-03 DIAGNOSIS — Z13820 Encounter for screening for osteoporosis: Secondary | ICD-10-CM

## 2019-01-03 DIAGNOSIS — E1165 Type 2 diabetes mellitus with hyperglycemia: Secondary | ICD-10-CM

## 2019-01-03 DIAGNOSIS — J449 Chronic obstructive pulmonary disease, unspecified: Secondary | ICD-10-CM | POA: Diagnosis not present

## 2019-01-03 MED ORDER — LOSARTAN POTASSIUM-HCTZ 50-12.5 MG PO TABS: 1.0000 | ORAL_TABLET | Freq: Every day | ORAL | 3 refills | Status: DC

## 2019-01-03 NOTE — Progress Notes (Signed)
[facility] [place], [place] [address]  Internal MEDICINE  Office Visit Note  Patient Name: Lynn Robbins  [id number]  [id number]  Date of Service: 01/21/2019  Chief Complaint  Patient presents with  . Annual Exam  . Diabetes  . Hypertension  . Quality Metric Gaps    diabetic foot exam     HPI Pt is here for routine health maintenance examination with no acute issues to discuss today. Discussed current chronic health diagnoses and reports feeling as though these are well maintained. Spiriva well controlling her COPD, reports rarely using her albuterol inhaler for increased shortness of breath of wheezing. Compliant with wearing 2L [place] daily and at night and denies feeling as though oxygen requirements needing to be titrated up. Diabetes well controlled on current therapy, discussed importance of eating a healthy and balanced diet and daily exercise. Anxiety is well controlled on clonopin, using twice daily, reports sleeping well at night without racing thought. Overall, Deneise reports feeling well, many concerns about COVID19 pandemic, discussed importance of wearing a mask and social distancing as she is at an increased risk due to underlying health complications. Denies chest pain, increased shortness of breath or cough.  Current Medication: Outpatient Encounter Medications as of 01/03/2019  Medication Sig  . Accu-Chek FastClix Lancets MISC Use as directed to check sugars. DX E11.65  . albuterol (PROAIR HFA) 108 (90 Base) MCG/ACT inhaler 2 PUFFS EVERY 4 HOURS AS NEEDED  . atenolol (TENORMIN) 25 MG tablet Take 1 tablet (25 mg total) by mouth daily.  Marland Kitchen glimepiride (AMARYL) 2 MG tablet Take 1 tablet (2 mg total) by mouth daily with breakfast.  . HYDROcodone-acetaminophen (NORCO/VICODIN) 5-325 MG tablet TAKE 1 TABLET BY MOUTH TWICE DAILY AS NEEDED FOR BACK PAIN  . losartan-hydrochlorothiazide (HYZAAR) 50-12.5 MG tablet Take 1 tablet by mouth daily.  . OXYGEN  Inhale into the lungs.  . pantoprazole (PROTONIX) 40 MG tablet Take 1 tablet (40 mg total) by mouth daily.  . SitaGLIPtin-MetFORMIN HCl (JANUMET XR) 50-500 MG TB24 TAKE ONE TAB A DAY WITH FOOD FOR DIABETES  . tiotropium (SPIRIVA HANDIHALER) 18 MCG inhalation capsule INHALE 1 CAPSULE VIA HANDIHALER ONCE DAILY AT THE SAME TIME EVERY DAY  . [DISCONTINUED] clonazePAM (KLONOPIN) 1 MG tablet Take one tab po bid for anxiety  . [DISCONTINUED] losartan-hydrochlorothiazide (HYZAAR) 50-12.5 MG tablet Take 1 tablet by mouth daily.   No facility-administered encounter medications on file as of 01/03/2019.     Surgical History: Past Surgical History:  Procedure Laterality Date  . CESAREAN SECTION      Medical History: Past Medical History:  Diagnosis Date  . Cancer ([place])   . CHF (congestive heart failure) ([place])   . COPD (chronic obstructive pulmonary disease) ([place])   . Depression   . Diabetes mellitus without complication ([place])   . HOH (hard of hearing)   . Hypertension     Family History: Family History  Problem Relation Age of Onset  . Diabetes Father   . Colon cancer Father   . Colon cancer Mother   . Aneurysm Mother 8       brain  . Lung cancer Sister   . Breast cancer Sister   . Lung cancer Brother   . Lung cancer Sister   . Bone cancer Brother   . Lung cancer Brother   . Cancer Sister        type unknown      Review of Systems  Constitutional: Negative for chills,  fatigue and unexpected weight change.  HENT: Negative for congestion, rhinorrhea, sneezing and sore throat.   Eyes: Negative for photophobia, pain and redness.  Respiratory: Negative for cough, chest tightness and shortness of breath.   Cardiovascular: Negative for chest pain and palpitations.  Gastrointestinal: Negative for abdominal pain, constipation, diarrhea, nausea and vomiting.  Endocrine: Negative.   Genitourinary: Negative for dysuria and frequency.  Musculoskeletal: Negative for arthralgias, back  pain, joint swelling and neck pain.  Skin: Negative for rash.  Allergic/Immunologic: Negative.   Neurological: Negative for tremors and numbness.  Hematological: Negative for adenopathy. Does not bruise/bleed easily.  Psychiatric/Behavioral: Negative for behavioral problems and sleep disturbance. The patient is not nervous/anxious.      Vital Signs: BP 129/68   Pulse 87   Temp 97.6 F (36.4 C)   Resp 16   Ht 5' (1.524 m)   Wt 191 lb (86.6 kg)   SpO2 96%   BMI 37.30 kg/m    Physical Exam Vitals signs and nursing note reviewed.  Constitutional:      General: She is not in acute distress.    Appearance: She is well-developed. She is not diaphoretic.  HENT:     Head: Normocephalic and atraumatic.     Mouth/Throat:     Pharynx: No oropharyngeal exudate.  Eyes:     Pupils: Pupils are equal, round, and reactive to light.  Neck:     Musculoskeletal: Normal range of motion and neck supple.     Thyroid: No thyromegaly.     Vascular: No JVD.     Trachea: No tracheal deviation.  Cardiovascular:     Rate and Rhythm: Normal rate and regular rhythm.     Heart sounds: Normal heart sounds. No murmur. No friction rub. No gallop.   Pulmonary:     Effort: Pulmonary effort is normal. No respiratory distress.     Breath sounds: Wheezing present. No rales.  Chest:     Chest wall: No tenderness.  Abdominal:     Palpations: Abdomen is soft.     Tenderness: There is no abdominal tenderness. There is no guarding.  Musculoskeletal: Normal range of motion.  Lymphadenopathy:     Cervical: No cervical adenopathy.  Skin:    General: Skin is warm and dry.     Comments: Small burn area on left wrist, reports grease hitting her in that area while cooking breakfast, no break in skin  Neurological:     Mental Status: She is alert and oriented to person, place, and time.     Cranial Nerves: No cranial nerve deficit.  Psychiatric:        Behavior: Behavior normal.        Thought Content: Thought  content normal.        Judgment: Judgment normal.      LABS: Recent Results (from the past 2160 hour(s))  POCT HgB A1C     Status: Abnormal   Collection Time: 11/22/18  1:36 PM  Result Value Ref Range   Hemoglobin A1C 6.4 (A) 4.0 - 5.6 %   HbA1c POC (<> result, manual entry)     HbA1c, POC (prediabetic range)     HbA1c, POC (controlled diabetic range)       Assessment/Plan: 1. Encounter for general adult medical examination with abnormal findings Well appearing 78 year old in no acute distress. Influenza vaccine given today, diabetic foot exam performed today and BMD scan ordered, otherwise up to date on PHM. Labs ordered, slip given to patient.  2. COPD, severe ([place]) Stable on current therapy, continue to monitor.  3. Essential hypertension Stable on current therapy, continue to monitor. - losartan-hydrochlorothiazide (HYZAAR) 50-12.5 MG tablet; Take 1 tablet by mouth daily.  Dispense: 90 tablet; Refill: 3  4. Type 2 diabetes mellitus with hyperglycemia, unspecified whether long term insulin use (HCC) Stable on current therapy, continue to monitor.  5. Generalized anxiety disorder Stable on current therapy, continue to monitor.  6. Screening for osteoporosis - DG Bone Density; Future  7. Flu vaccine need - Flu Vaccine MDCK QUAD PF   General Counseling: deshanti adcox understanding of the findings of todays visit and agrees with plan of treatment. I have discussed any further diagnostic evaluation that may be needed or ordered today. We also reviewed her medications today. she has been encouraged to call the office with any questions or concerns that should arise related to todays visit.   Orders Placed This Encounter  Procedures  . DG Bone Density  . Flu Vaccine MDCK QUAD PF    Meds ordered this encounter  Medications  . losartan-hydrochlorothiazide (HYZAAR) 50-12.5 MG tablet    Sig: Take 1 tablet by mouth daily.    Dispense:  90 tablet    Refill:  3     Time spent: 30 Minutes   This patient was seen by Orson Gear AGNP-C in Collaboration with Dr [name] as a part of collaborative care agreement    [name] AGNP-C Internal Medicine

## 2019-01-04 ENCOUNTER — Other Ambulatory Visit: Payer: Self-pay | Admitting: Internal Medicine

## 2019-01-04 DIAGNOSIS — F411 Generalized anxiety disorder: Secondary | ICD-10-CM

## 2019-01-05 ENCOUNTER — Other Ambulatory Visit: Payer: Self-pay | Admitting: Adult Health

## 2019-01-05 DIAGNOSIS — F411 Generalized anxiety disorder: Secondary | ICD-10-CM

## 2019-01-05 MED ORDER — CLONAZEPAM 1 MG PO TABS: ORAL_TABLET | ORAL | 0 refills | Status: DC

## 2019-01-05 NOTE — Progress Notes (Signed)
RX sent to wrong pharmacy at visit.  [facility].

## 2019-01-06 DIAGNOSIS — J449 Chronic obstructive pulmonary disease, unspecified: Secondary | ICD-10-CM | POA: Diagnosis not present

## 2019-01-25 ENCOUNTER — Other Ambulatory Visit: Payer: Self-pay

## 2019-01-25 DIAGNOSIS — G894 Chronic pain syndrome: Secondary | ICD-10-CM

## 2019-01-25 MED ORDER — HYDROCODONE-ACETAMINOPHEN 5-325 MG PO TABS: ORAL_TABLET | ORAL | 0 refills | Status: DC

## 2019-02-05 DIAGNOSIS — J449 Chronic obstructive pulmonary disease, unspecified: Secondary | ICD-10-CM | POA: Diagnosis not present

## 2019-02-26 ENCOUNTER — Telehealth: Payer: Self-pay

## 2019-02-26 ENCOUNTER — Other Ambulatory Visit: Payer: Self-pay | Admitting: Adult Health

## 2019-02-26 DIAGNOSIS — G894 Chronic pain syndrome: Secondary | ICD-10-CM

## 2019-02-26 MED ORDER — HYDROCODONE-ACETAMINOPHEN 5-325 MG PO TABS: ORAL_TABLET | ORAL | 0 refills | Status: DC

## 2019-02-26 NOTE — Telephone Encounter (Signed)
Send med

## 2019-02-26 NOTE — Progress Notes (Signed)
Refilled patients hydrocodone, to get to next visit on 03/06/19

## 2019-02-27 ENCOUNTER — Telehealth: Payer: Self-pay

## 2019-02-27 NOTE — Telephone Encounter (Signed)
Called lmom informing patient of appointment. klh 

## 2019-02-27 NOTE — Telephone Encounter (Signed)
Pt has PCP app on 1/19 so it was given until then

## 2019-03-01 ENCOUNTER — Encounter: Payer: Self-pay | Admitting: Internal Medicine

## 2019-03-01 ENCOUNTER — Other Ambulatory Visit: Payer: Self-pay

## 2019-03-01 ENCOUNTER — Other Ambulatory Visit: Payer: Self-pay | Admitting: Adult Health

## 2019-03-01 ENCOUNTER — Ambulatory Visit (INDEPENDENT_AMBULATORY_CARE_PROVIDER_SITE_OTHER): Payer: Medicare Other | Admitting: Internal Medicine

## 2019-03-01 DIAGNOSIS — I10 Essential (primary) hypertension: Secondary | ICD-10-CM | POA: Diagnosis not present

## 2019-03-01 DIAGNOSIS — Z9981 Dependence on supplemental oxygen: Secondary | ICD-10-CM | POA: Diagnosis not present

## 2019-03-01 DIAGNOSIS — J449 Chronic obstructive pulmonary disease, unspecified: Secondary | ICD-10-CM | POA: Diagnosis not present

## 2019-03-01 DIAGNOSIS — J4520 Mild intermittent asthma, uncomplicated: Secondary | ICD-10-CM | POA: Diagnosis not present

## 2019-03-01 DIAGNOSIS — G894 Chronic pain syndrome: Secondary | ICD-10-CM

## 2019-03-01 MED ORDER — HYDROCODONE-ACETAMINOPHEN 5-325 MG PO TABS: ORAL_TABLET | ORAL | 0 refills | Status: DC

## 2019-03-01 MED ORDER — ATENOLOL 25 MG PO TABS: 25.0000 mg | ORAL_TABLET | Freq: Every day | ORAL | 3 refills | Status: DC

## 2019-03-01 NOTE — Progress Notes (Signed)
Refill atenolol at this time.

## 2019-03-01 NOTE — Progress Notes (Signed)
[facility] [place], [place] [address]  Internal MEDICINE  Telephone Visit  Patient Name: Lynn Robbins  [id number]  [id number]  Date of Service: 03/01/2019  I connected with the patient at 1026 by telephone and verified the patients identity using two identifiers.   I discussed the limitations, risks, security and privacy concerns of performing an evaluation and management service by telephone and the availability of in person appointments. I also discussed with the patient that there may be a patient responsible charge related to the service.  The patient expressed understanding and agrees to proceed.    Chief Complaint  Patient presents with  . Telephone Assessment  . Telephone Screen  . COPD    HPI  Pt seen via telephone. She is seen for follow up on copd asthma and chronic pain.  Overall she is doing well.  She is using her oxygen 2 lpm continuously.  She denies any need at this time. Her breathing has been doing well. She denies fever or recent hospitalizations.      Current Medication: Outpatient Encounter Medications as of 03/01/2019  Medication Sig  . Accu-Chek FastClix Lancets MISC Use as directed to check sugars. DX E11.65  . albuterol (PROAIR HFA) 108 (90 Base) MCG/ACT inhaler 2 PUFFS EVERY 4 HOURS AS NEEDED  . atenolol (TENORMIN) 25 MG tablet Take 1 tablet (25 mg total) by mouth daily.  . clonazePAM (KLONOPIN) 1 MG tablet TAKE ONE TAB BY MOUTH TWICE DAILY FOR ANXIETY  . glimepiride (AMARYL) 2 MG tablet Take 1 tablet (2 mg total) by mouth daily with breakfast.  . HYDROcodone-acetaminophen (NORCO/VICODIN) 5-325 MG tablet TAKE 1 TABLET BY MOUTH TWICE DAILY AS NEEDED FOR BACK PAIN  . losartan-hydrochlorothiazide (HYZAAR) 50-12.5 MG tablet Take 1 tablet by mouth daily.  . OXYGEN Inhale into the lungs.  . pantoprazole (PROTONIX) 40 MG tablet Take 1 tablet (40 mg total) by mouth daily.  . SitaGLIPtin-MetFORMIN HCl (JANUMET XR) 50-500 MG TB24 TAKE  ONE TAB A DAY WITH FOOD FOR DIABETES  . tiotropium (SPIRIVA HANDIHALER) 18 MCG inhalation capsule INHALE 1 CAPSULE VIA HANDIHALER ONCE DAILY AT THE SAME TIME EVERY DAY  . [DISCONTINUED] HYDROcodone-acetaminophen (NORCO/VICODIN) 5-325 MG tablet TAKE 1 TABLET BY MOUTH TWICE DAILY AS NEEDED FOR BACK PAIN   No facility-administered encounter medications on file as of 03/01/2019.    Surgical History: Past Surgical History:  Procedure Laterality Date  . CESAREAN SECTION      Medical History: Past Medical History:  Diagnosis Date  . Cancer ([place])   . CHF (congestive heart failure) ([place])   . COPD (chronic obstructive pulmonary disease) ([place])   . Depression   . Diabetes mellitus without complication ([place])   . HOH (hard of hearing)   . Hypertension     Family History: Family History  Problem Relation Age of Onset  . Diabetes Father   . Colon cancer Father   . Colon cancer Mother   . Aneurysm Mother 93       brain  . Lung cancer Sister   . Breast cancer Sister   . Lung cancer Brother   . Lung cancer Sister   . Bone cancer Brother   . Lung cancer Brother   . Cancer Sister        type unknown    Social History   Socioeconomic History  . Marital status: Single    Spouse name: Not on file  . Number of children: 5  . Years of  education: Not on file  . Highest education level: Not on file  Occupational History  . Not on file  Tobacco Use  . Smoking status: Former Smoker    Packs/day: 3.00    Years: 57.00    Pack years: 171.00    Types: Cigarettes    Quit date: 04/17/1998    Years since quitting: 20.8  . Smokeless tobacco: Former Systems developer    Types: Snuff  Substance and Sexual Activity  . Alcohol use: No  . Drug use: No  . Sexual activity: Never  Other Topics Concern  . Not on file  Social History Narrative   ** Merged History Encounter **       Social Determinants of Health   Financial Resource Strain:   . Difficulty of Paying Living Expenses: Not on file  Food  Insecurity:   . Worried About Charity fundraiser in the Last Year: Not on file  . Ran Out of Food in the Last Year: Not on file  Transportation Needs:   . Lack of Transportation (Medical): Not on file  . Lack of Transportation (Non-Medical): Not on file  Physical Activity:   . Days of Exercise per Week: Not on file  . Minutes of Exercise per Session: Not on file  Stress:   . Feeling of Stress : Not on file  Social Connections:   . Frequency of Communication with Friends and Family: Not on file  . Frequency of Social Gatherings with Friends and Family: Not on file  . Attends Religious Services: Not on file  . Active Member of Clubs or Organizations: Not on file  . Attends Archivist Meetings: Not on file  . Marital Status: Not on file  Intimate Partner Violence:   . Fear of Current or Ex-Partner: Not on file  . Emotionally Abused: Not on file  . Physically Abused: Not on file  . Sexually Abused: Not on file      Review of Systems  Constitutional: Negative for chills, fatigue and unexpected weight change.  HENT: Negative for congestion, rhinorrhea, sneezing and sore throat.   Eyes: Negative for photophobia, pain and redness.  Respiratory: Negative for cough, chest tightness and shortness of breath.   Cardiovascular: Negative for chest pain and palpitations.  Gastrointestinal: Negative for abdominal pain, constipation, diarrhea, nausea and vomiting.  Endocrine: Negative.   Genitourinary: Negative for dysuria and frequency.  Musculoskeletal: Negative for arthralgias, back pain, joint swelling and neck pain.  Skin: Negative for rash.  Allergic/Immunologic: Negative.   Neurological: Negative for tremors and numbness.  Hematological: Negative for adenopathy. Does not bruise/bleed easily.  Psychiatric/Behavioral: Negative for behavioral problems and sleep disturbance. The patient is not nervous/anxious.     Vital Signs: There were no vitals taken for this  visit.   Observation/Objective:  Well sounding, NAD at this time.    Assessment/Plan: 1. COPD, severe ([place]) Stable, continue present management  2. Supplemental oxygen dependent Continue to use oxygen at 2 lpm.   3. Intermittent asthma without complication, unspecified asthma severity Stable, continue current therapy.  4. Essential hypertension Stable, continue present management.   5. Chronic pain disorder Reviewed risks and possible side effects associated with taking opiates, benzodiazepines and other CNS depressants. Combination of these could cause dizziness and drowsiness. Advised patient not to drive or operate machinery when taking these medications, as patient's and other's life can be at risk and will have consequences. Patient verbalized understanding in this matter. Dependence and abuse for these drugs will  be monitored closely. A Controlled substance policy and procedure is on file which allows [facility] to order a urine drug screen test at any visit. Patient understands and agrees with the plan - HYDROcodone-acetaminophen (NORCO/VICODIN) 5-325 MG tablet; TAKE 1 TABLET BY MOUTH TWICE DAILY AS NEEDED FOR BACK PAIN  Dispense: 60 tablet; Refill: 0  General Counseling: kriti katayama understanding of the findings of today's phone visit and agrees with plan of treatment. I have discussed any further diagnostic evaluation that may be needed or ordered today. We also reviewed her medications today. she has been encouraged to call the office with any questions or concerns that should arise related to todays visit.    No orders of the defined types were placed in this encounter.   Meds ordered this encounter  Medications  . HYDROcodone-acetaminophen (NORCO/VICODIN) 5-325 MG tablet    Sig: TAKE 1 TABLET BY MOUTH TWICE DAILY AS NEEDED FOR BACK PAIN    Dispense:  60 tablet    Refill:  0    Time spent: [place] AGNP-C Internal medicine

## 2019-03-02 ENCOUNTER — Telehealth: Payer: Self-pay

## 2019-03-02 NOTE — Telephone Encounter (Signed)
CONFIRMED 03-06-19 OV AS VIRTUAL.

## 2019-03-06 ENCOUNTER — Encounter: Payer: Self-pay | Admitting: Nurse Practitioner

## 2019-03-06 ENCOUNTER — Ambulatory Visit (INDEPENDENT_AMBULATORY_CARE_PROVIDER_SITE_OTHER): Payer: Medicare Other | Admitting: Nurse Practitioner

## 2019-03-06 ENCOUNTER — Other Ambulatory Visit: Payer: Self-pay

## 2019-03-06 VITALS — BP 169/77 | HR 84 | Resp 16 | Ht 60.0 in | Wt 190.4 lb

## 2019-03-06 DIAGNOSIS — E1165 Type 2 diabetes mellitus with hyperglycemia: Secondary | ICD-10-CM

## 2019-03-06 DIAGNOSIS — H9193 Unspecified hearing loss, bilateral: Secondary | ICD-10-CM

## 2019-03-06 DIAGNOSIS — I10 Essential (primary) hypertension: Secondary | ICD-10-CM | POA: Diagnosis not present

## 2019-03-06 DIAGNOSIS — F411 Generalized anxiety disorder: Secondary | ICD-10-CM | POA: Diagnosis not present

## 2019-03-06 DIAGNOSIS — G894 Chronic pain syndrome: Secondary | ICD-10-CM

## 2019-03-06 LAB — POCT GLYCOSYLATED HEMOGLOBIN (HGB A1C): Hemoglobin A1C: 6.4 % — AB (ref 4.0–5.6)

## 2019-03-06 MED ORDER — CLONAZEPAM 1 MG PO TABS: ORAL_TABLET | ORAL | 0 refills | Status: DC

## 2019-03-06 MED ORDER — GLIMEPIRIDE 2 MG PO TABS: 2.0000 mg | ORAL_TABLET | Freq: Every day | ORAL | 3 refills | Status: DC

## 2019-03-06 MED ORDER — HYDROCODONE-ACETAMINOPHEN 5-325 MG PO TABS: ORAL_TABLET | ORAL | 0 refills | Status: DC

## 2019-03-06 NOTE — Progress Notes (Signed)
[facility] [place], [place] [address]  Internal MEDICINE  Office Visit Note  Patient Name: Lynn Robbins  [id number]  [id number]  Date of Service: 03/09/2019  Chief Complaint  Patient presents with  . Diabetes  . Hypertension    The patient presents for routine follow up. Blood sugars are well managed. Recently completed treatment for lung cancer. Will follow up with radiation oncologist in March for repeat CT scan. Does have moderate left arm pain after completing radiation.  Difficulty hearing. Worse since people are wearing masks and she cannot see what they are saying. Does not wear hearing aids at this time. Patient's son states he has to shout at her for her to hear some of what he is saying.       Current Medication: Outpatient Encounter Medications as of 03/06/2019  Medication Sig  . Accu-Chek FastClix Lancets MISC Use as directed to check sugars. DX E11.65  . albuterol (PROAIR HFA) 108 (90 Base) MCG/ACT inhaler 2 PUFFS EVERY 4 HOURS AS NEEDED  . atenolol (TENORMIN) 25 MG tablet Take 1 tablet (25 mg total) by mouth daily.  . clonazePAM (KLONOPIN) 1 MG tablet TAKE ONE TAB BY MOUTH TWICE DAILY FOR ANXIETY  . glimepiride (AMARYL) 2 MG tablet Take 1 tablet (2 mg total) by mouth daily with breakfast.  . HYDROcodone-acetaminophen (NORCO/VICODIN) 5-325 MG tablet TAKE 1 TABLET BY MOUTH TWICE DAILY AS NEEDED FOR BACK PAIN  . losartan-hydrochlorothiazide (HYZAAR) 50-12.5 MG tablet Take 1 tablet by mouth daily.  . OXYGEN Inhale into the lungs.  . pantoprazole (PROTONIX) 40 MG tablet Take 1 tablet (40 mg total) by mouth daily.  . SitaGLIPtin-MetFORMIN HCl (JANUMET XR) 50-500 MG TB24 TAKE ONE TAB A DAY WITH FOOD FOR DIABETES  . tiotropium (SPIRIVA HANDIHALER) 18 MCG inhalation capsule INHALE 1 CAPSULE VIA HANDIHALER ONCE DAILY AT THE SAME TIME EVERY DAY  . [DISCONTINUED] clonazePAM (KLONOPIN) 1 MG tablet TAKE ONE TAB BY MOUTH TWICE DAILY FOR ANXIETY  .  [DISCONTINUED] glimepiride (AMARYL) 2 MG tablet Take 1 tablet (2 mg total) by mouth daily with breakfast.  . [DISCONTINUED] HYDROcodone-acetaminophen (NORCO/VICODIN) 5-325 MG tablet TAKE 1 TABLET BY MOUTH TWICE DAILY AS NEEDED FOR BACK PAIN   No facility-administered encounter medications on file as of 03/06/2019.    Surgical History: Past Surgical History:  Procedure Laterality Date  . CESAREAN SECTION      Medical History: Past Medical History:  Diagnosis Date  . Cancer ([place])   . CHF (congestive heart failure) ([place])   . COPD (chronic obstructive pulmonary disease) ([place])   . Depression   . Diabetes mellitus without complication ([place])   . HOH (hard of hearing)   . Hypertension     Family History: Family History  Problem Relation Age of Onset  . Diabetes Father   . Colon cancer Father   . Colon cancer Mother   . Aneurysm Mother 69       brain  . Lung cancer Sister   . Breast cancer Sister   . Lung cancer Brother   . Lung cancer Sister   . Bone cancer Brother   . Lung cancer Brother   . Cancer Sister        type unknown    Social History   Socioeconomic History  . Marital status: Single    Spouse name: Not on file  . Number of children: 5  . Years of education: Not on file  . Highest education level: Not on  file  Occupational History  . Not on file  Tobacco Use  . Smoking status: Former Smoker    Packs/day: 3.00    Years: 57.00    Pack years: 171.00    Types: Cigarettes    Quit date: 04/17/1998    Years since quitting: 20.9  . Smokeless tobacco: Former Systems developer    Types: Snuff  Substance and Sexual Activity  . Alcohol use: No  . Drug use: No  . Sexual activity: Never  Other Topics Concern  . Not on file  Social History Narrative   ** Merged History Encounter **       Social Determinants of Health   Financial Resource Strain:   . Difficulty of Paying Living Expenses: Not on file  Food Insecurity:   . Worried About Charity fundraiser in the Last  Year: Not on file  . Ran Out of Food in the Last Year: Not on file  Transportation Needs:   . Lack of Transportation (Medical): Not on file  . Lack of Transportation (Non-Medical): Not on file  Physical Activity:   . Days of Exercise per Week: Not on file  . Minutes of Exercise per Session: Not on file  Stress:   . Feeling of Stress : Not on file  Social Connections:   . Frequency of Communication with Friends and Family: Not on file  . Frequency of Social Gatherings with Friends and Family: Not on file  . Attends Religious Services: Not on file  . Active Member of Clubs or Organizations: Not on file  . Attends Archivist Meetings: Not on file  . Marital Status: Not on file  Intimate Partner Violence:   . Fear of Current or Ex-Partner: Not on file  . Emotionally Abused: Not on file  . Physically Abused: Not on file  . Sexually Abused: Not on file      Review of Systems  Constitutional: Negative for activity change, chills, fatigue and unexpected weight change.  HENT: Negative for congestion, rhinorrhea, sneezing and sore throat.        Severe hearing loss.   Respiratory: Negative for cough, chest tightness, shortness of breath and wheezing.   Cardiovascular: Negative for chest pain and palpitations.  Gastrointestinal: Negative for abdominal pain, constipation, diarrhea, nausea and vomiting.  Endocrine: Negative for cold intolerance, heat intolerance, polydipsia and polyuria.       Blood sugars doing well   Musculoskeletal: Negative for arthralgias, back pain, joint swelling and neck pain.  Skin: Negative for rash.  Neurological: Positive for weakness. Negative for tremors and numbness.  Hematological: Negative for adenopathy. Does not bruise/bleed easily.  Psychiatric/Behavioral: Negative for behavioral problems and sleep disturbance. The patient is nervous/anxious.    Today's Vitals   03/06/19 1023  BP: (!) 169/77  Pulse: 84  Resp: 16  SpO2: 97%  Weight: 190  lb 6.4 oz (86.4 kg)  Height: 5' (1.524 m)   Body mass index is 37.18 kg/m.   Physical Exam Vitals and nursing note reviewed.  Constitutional:      General: She is not in acute distress.    Appearance: Normal appearance. She is well-developed. She is obese. She is not diaphoretic.  HENT:     Head: Normocephalic and atraumatic.     Nose: Nose normal.     Mouth/Throat:     Pharynx: No oropharyngeal exudate.  Eyes:     Pupils: Pupils are equal, round, and reactive to light.  Neck:     Thyroid:  No thyromegaly.     Vascular: No JVD.     Trachea: No tracheal deviation.  Cardiovascular:     Rate and Rhythm: Normal rate and regular rhythm.     Heart sounds: Normal heart sounds. No murmur. No friction rub. No gallop.   Pulmonary:     Effort: Pulmonary effort is normal. No respiratory distress.     Breath sounds: Normal breath sounds. No wheezing or rales.  Chest:     Chest wall: No tenderness.  Abdominal:     Palpations: Abdomen is soft.  Musculoskeletal:        General: Normal range of motion.     Cervical back: Normal range of motion and neck supple.     Comments: The patient has moderate lower back pain, worse with bending and twisting at the waist. Uses a walker to help with ambulation.   Lymphadenopathy:     Cervical: No cervical adenopathy.  Skin:    General: Skin is warm and dry.  Neurological:     Mental Status: She is alert and oriented to person, place, and time. Mental status is at baseline.     Cranial Nerves: No cranial nerve deficit.  Psychiatric:        Behavior: Behavior normal.        Thought Content: Thought content normal.        Judgment: Judgment normal.   Assessment/Plan: 1. Type 2 diabetes mellitus with hyperglycemia, without long-term current use of insulin (HCC) HgbA1c 6.4 today. Continue diabetic medication as prescribed. Refills provided today.  - glimepiride (AMARYL) 2 MG tablet; Take 1 tablet (2 mg total) by mouth daily with breakfast.  Dispense:  90 tablet; Refill: 3  2. Essential hypertension Generally well managed. Continue BP medication as prescribed.   3. Bilateral hearing loss, unspecified hearing loss type Worsening. Refer to audiology for further evaluation and treatment.  - Ambulatory referral to Audiology  4. Generalized anxiety disorder May continue clonazepam 1mg  up to twice daily as needed for anxiety. New prescription sent to her pharmacy today.  - clonazePAM (KLONOPIN) 1 MG tablet; TAKE ONE TAB BY MOUTH TWICE DAILY FOR ANXIETY  Dispense: 180 tablet; Refill: 0  5. Chronic pain disorder May continue to take hydrocodone/APAP 5/325mg  up to twice daily as needed for pain control. New prescription sent to her pharmacy today.  - HYDROcodone-acetaminophen (NORCO/VICODIN) 5-325 MG tablet; TAKE 1 TABLET BY MOUTH TWICE DAILY AS NEEDED FOR BACK PAIN  Dispense: 60 tablet; Refill: 0   General Counseling: raenette sakata understanding of the findings of todays visit and agrees with plan of treatment. I have discussed any further diagnostic evaluation that may be needed or ordered today. We also reviewed her medications today. she has been encouraged to call the office with any questions or concerns that should arise related to todays visit.  Reviewed risks and possible side effects associated with taking opiates, benzodiazepines and other CNS depressants. Combination of these could cause dizziness and drowsiness. Advised patient not to drive or operate machinery when taking these medications, as patient's and other's life can be at risk and will have consequences. Patient verbalized understanding in this matter. Dependence and abuse for these drugs will be monitored closely. A Controlled substance policy and procedure is on file which allows [facility] to order a urine drug screen test at any visit. Patient understands and agrees with the plan  This patient was seen by Leretha Pol FNP Collaboration with Dr [name]  as a part  of collaborative care agreement  Orders Placed This Encounter  Procedures  . Ambulatory referral to Audiology  . POCT HgB A1C    Meds ordered this encounter  Medications  . glimepiride (AMARYL) 2 MG tablet    Sig: Take 1 tablet (2 mg total) by mouth daily with breakfast.    Dispense:  90 tablet    Refill:  3    Order Specific Question:   Supervising Provider    Answer:   [name] [place]  . clonazePAM (KLONOPIN) 1 MG tablet    Sig: TAKE ONE TAB BY MOUTH TWICE DAILY FOR ANXIETY    Dispense:  180 tablet    Refill:  0    Will be only given 90 day supply with no refills    Order Specific Question:   Supervising Provider    Answer:   [name] [place]  . HYDROcodone-acetaminophen (NORCO/VICODIN) 5-325 MG tablet    Sig: TAKE 1 TABLET BY MOUTH TWICE DAILY AS NEEDED FOR BACK PAIN    Dispense:  60 tablet    Refill:  0    Fill after 03/30/2019    Order Specific Question:   Supervising Provider    Answer:   [name] [[id number]]    Total time spent: 30 Minutes  Time spent includes review of chart, medications, test results, and follow up plan with the patient.      Dr [name] Internal medicine

## 2019-03-08 DIAGNOSIS — J449 Chronic obstructive pulmonary disease, unspecified: Secondary | ICD-10-CM | POA: Diagnosis not present

## 2019-03-09 DIAGNOSIS — F411 Generalized anxiety disorder: Secondary | ICD-10-CM | POA: Insufficient documentation

## 2019-03-09 DIAGNOSIS — H9193 Unspecified hearing loss, bilateral: Secondary | ICD-10-CM | POA: Insufficient documentation

## 2019-03-15 ENCOUNTER — Other Ambulatory Visit: Payer: Self-pay | Admitting: Nurse Practitioner

## 2019-03-15 ENCOUNTER — Telehealth: Payer: Self-pay

## 2019-03-15 DIAGNOSIS — H60503 Unspecified acute noninfective otitis externa, bilateral: Secondary | ICD-10-CM

## 2019-03-15 MED ORDER — CIPROFLOXACIN-DEXAMETHASONE 0.3-0.1 % OT SUSP: 4.0000 [drp] | Freq: Two times a day (BID) | OTIC | 0 refills | Status: DC

## 2019-03-15 NOTE — Progress Notes (Signed)
Sent rx for ciprodex ear drops. Use four drops in both ears twice daily for next 7 days. [facility].

## 2019-03-15 NOTE — Telephone Encounter (Signed)
Sent rx for ciprodex ear drops. Use four drops in both ears twice daily for next 7 days. [facility] to [facility].

## 2019-03-16 NOTE — Telephone Encounter (Signed)
Left message on both contact numbers and advised pt pick up prescription.

## 2019-03-21 ENCOUNTER — Telehealth: Payer: Self-pay | Admitting: Oncology

## 2019-03-21 NOTE — Telephone Encounter (Signed)
Dr. Janese Banks will not be in the office on 04-26-19. Appts with Dr. Janese Banks and Dr. Baruch Gouty were moved to 04-30-19. Patient and daughter are aware of date/time changes.

## 2019-04-08 DIAGNOSIS — J449 Chronic obstructive pulmonary disease, unspecified: Secondary | ICD-10-CM | POA: Diagnosis not present

## 2019-04-23 ENCOUNTER — Other Ambulatory Visit: Payer: Self-pay

## 2019-04-23 ENCOUNTER — Other Ambulatory Visit: Payer: Medicare Other

## 2019-04-23 ENCOUNTER — Inpatient Hospital Stay: Payer: Medicare Other | Attending: Oncology

## 2019-04-23 ENCOUNTER — Ambulatory Visit
Admission: RE | Admit: 2019-04-23 | Discharge: 2019-04-23 | Disposition: A | Discharge status: Home / Self Care | Payer: Medicare Other | Source: Ambulatory Visit | Attending: Oncology | Admitting: Oncology

## 2019-04-23 DIAGNOSIS — Z803 Family history of malignant neoplasm of breast: Secondary | ICD-10-CM | POA: Diagnosis not present

## 2019-04-23 DIAGNOSIS — Z79899 Other long term (current) drug therapy: Secondary | ICD-10-CM | POA: Diagnosis not present

## 2019-04-23 DIAGNOSIS — Z85118 Personal history of other malignant neoplasm of bronchus and lung: Secondary | ICD-10-CM | POA: Insufficient documentation

## 2019-04-23 DIAGNOSIS — I110 Hypertensive heart disease with heart failure: Secondary | ICD-10-CM | POA: Insufficient documentation

## 2019-04-23 DIAGNOSIS — Z9981 Dependence on supplemental oxygen: Secondary | ICD-10-CM | POA: Diagnosis not present

## 2019-04-23 DIAGNOSIS — Z800 Family history of malignant neoplasm of digestive organs: Secondary | ICD-10-CM | POA: Diagnosis not present

## 2019-04-23 DIAGNOSIS — Z7984 Long term (current) use of oral hypoglycemic drugs: Secondary | ICD-10-CM | POA: Diagnosis not present

## 2019-04-23 DIAGNOSIS — Z08 Encounter for follow-up examination after completed treatment for malignant neoplasm: Secondary | ICD-10-CM

## 2019-04-23 DIAGNOSIS — E119 Type 2 diabetes mellitus without complications: Secondary | ICD-10-CM | POA: Insufficient documentation

## 2019-04-23 DIAGNOSIS — J449 Chronic obstructive pulmonary disease, unspecified: Secondary | ICD-10-CM | POA: Diagnosis not present

## 2019-04-23 DIAGNOSIS — Z833 Family history of diabetes mellitus: Secondary | ICD-10-CM | POA: Diagnosis not present

## 2019-04-23 DIAGNOSIS — Z801 Family history of malignant neoplasm of trachea, bronchus and lung: Secondary | ICD-10-CM | POA: Insufficient documentation

## 2019-04-23 DIAGNOSIS — I509 Heart failure, unspecified: Secondary | ICD-10-CM | POA: Diagnosis not present

## 2019-04-23 DIAGNOSIS — Z87891 Personal history of nicotine dependence: Secondary | ICD-10-CM | POA: Insufficient documentation

## 2019-04-23 LAB — CBC WITH DIFFERENTIAL/PLATELET
Abs Immature Granulocytes: 0.03 10*3/uL (ref 0.00–0.07)
Basophils Absolute: 0.1 10*3/uL (ref 0.0–0.1)
Basophils Relative: 1 %
Eosinophils Absolute: 0.2 10*3/uL (ref 0.0–0.5)
Eosinophils Relative: 3 %
HCT: 38.2 % (ref 36.0–46.0)
Hemoglobin: 11.3 g/dL — ABNORMAL LOW (ref 12.0–15.0)
Immature Granulocytes: 0 %
Lymphocytes Relative: 19 %
Lymphs Abs: 1.6 10*3/uL (ref 0.7–4.0)
MCH: 29.8 pg (ref 26.0–34.0)
MCHC: 29.6 g/dL — ABNORMAL LOW (ref 30.0–36.0)
MCV: 100.8 fL — ABNORMAL HIGH (ref 80.0–100.0)
Monocytes Absolute: 0.6 10*3/uL (ref 0.1–1.0)
Monocytes Relative: 7 %
Neutro Abs: 6.1 10*3/uL (ref 1.7–7.7)
Neutrophils Relative %: 70 %
Platelets: 269 10*3/uL (ref 150–400)
RBC: 3.79 MIL/uL — ABNORMAL LOW (ref 3.87–5.11)
RDW: 13.3 % (ref 11.5–15.5)
WBC: 8.6 10*3/uL (ref 4.0–10.5)
nRBC: 0 % (ref 0.0–0.2)

## 2019-04-23 LAB — COMPREHENSIVE METABOLIC PANEL
ALT: 16 U/L (ref 0–44)
AST: 18 U/L (ref 15–41)
Albumin: 3.7 g/dL (ref 3.5–5.0)
Alkaline Phosphatase: 79 U/L (ref 38–126)
Anion gap: 10 (ref 5–15)
BUN: 24 mg/dL — ABNORMAL HIGH (ref 8–23)
CO2: 27 mmol/L (ref 22–32)
Calcium: 9.2 mg/dL (ref 8.9–10.3)
Chloride: 102 mmol/L (ref 98–111)
Creatinine, Ser: 0.79 mg/dL (ref 0.44–1.00)
GFR calc Af Amer: >60 mL/min (ref >60)
GFR calc non Af Amer: >60 mL/min (ref >60)
Glucose, Bld: 245 mg/dL — ABNORMAL HIGH (ref 70–99)
Potassium: 4.3 mmol/L (ref 3.5–5.1)
Sodium: 139 mmol/L (ref 135–145)
Total Bilirubin: 0.3 mg/dL (ref 0.3–1.2)
Total Protein: 7.3 g/dL (ref 6.5–8.1)

## 2019-04-25 ENCOUNTER — Telehealth: Payer: Self-pay | Admitting: Oncology

## 2019-04-25 NOTE — Telephone Encounter (Signed)
Writer received notification that patient had phoned regarding prep for a CT. Writer phoned patient and left voicemail to discuss this and assist patient. Patient to return call.

## 2019-04-26 ENCOUNTER — Ambulatory Visit: Payer: Medicare Other | Admitting: Oncology

## 2019-04-26 ENCOUNTER — Ambulatory Visit: Payer: Medicare Other | Admitting: Radiation Oncology

## 2019-04-27 ENCOUNTER — Other Ambulatory Visit: Payer: Self-pay

## 2019-04-30 ENCOUNTER — Other Ambulatory Visit: Payer: Self-pay | Admitting: *Deleted

## 2019-04-30 ENCOUNTER — Other Ambulatory Visit: Payer: Self-pay

## 2019-04-30 ENCOUNTER — Ambulatory Visit
Admission: RE | Admit: 2019-04-30 | Discharge: 2019-04-30 | Disposition: A | Discharge status: Home / Self Care | Payer: Medicare Other | Source: Ambulatory Visit | Attending: Radiation Oncology | Admitting: Radiation Oncology

## 2019-04-30 ENCOUNTER — Inpatient Hospital Stay (HOSPITAL_BASED_OUTPATIENT_CLINIC_OR_DEPARTMENT_OTHER): Payer: Medicare Other | Admitting: Oncology

## 2019-04-30 VITALS — BP 120/72 | HR 83 | Temp 95.2°F | Resp 20 | Wt 193.0 lb

## 2019-04-30 DIAGNOSIS — Z803 Family history of malignant neoplasm of breast: Secondary | ICD-10-CM | POA: Diagnosis not present

## 2019-04-30 DIAGNOSIS — Z923 Personal history of irradiation: Secondary | ICD-10-CM | POA: Diagnosis not present

## 2019-04-30 DIAGNOSIS — C3412 Malignant neoplasm of upper lobe, left bronchus or lung: Secondary | ICD-10-CM | POA: Insufficient documentation

## 2019-04-30 DIAGNOSIS — J449 Chronic obstructive pulmonary disease, unspecified: Secondary | ICD-10-CM | POA: Diagnosis not present

## 2019-04-30 DIAGNOSIS — Z993 Dependence on wheelchair: Secondary | ICD-10-CM | POA: Insufficient documentation

## 2019-04-30 DIAGNOSIS — E119 Type 2 diabetes mellitus without complications: Secondary | ICD-10-CM | POA: Diagnosis not present

## 2019-04-30 DIAGNOSIS — Z85118 Personal history of other malignant neoplasm of bronchus and lung: Secondary | ICD-10-CM | POA: Diagnosis not present

## 2019-04-30 DIAGNOSIS — Z08 Encounter for follow-up examination after completed treatment for malignant neoplasm: Secondary | ICD-10-CM

## 2019-04-30 DIAGNOSIS — Z7984 Long term (current) use of oral hypoglycemic drugs: Secondary | ICD-10-CM | POA: Diagnosis not present

## 2019-04-30 DIAGNOSIS — Z87891 Personal history of nicotine dependence: Secondary | ICD-10-CM | POA: Diagnosis not present

## 2019-04-30 DIAGNOSIS — I509 Heart failure, unspecified: Secondary | ICD-10-CM | POA: Diagnosis not present

## 2019-04-30 DIAGNOSIS — Z800 Family history of malignant neoplasm of digestive organs: Secondary | ICD-10-CM | POA: Diagnosis not present

## 2019-04-30 DIAGNOSIS — Z9981 Dependence on supplemental oxygen: Secondary | ICD-10-CM | POA: Diagnosis not present

## 2019-04-30 DIAGNOSIS — I110 Hypertensive heart disease with heart failure: Secondary | ICD-10-CM | POA: Diagnosis not present

## 2019-04-30 DIAGNOSIS — Z801 Family history of malignant neoplasm of trachea, bronchus and lung: Secondary | ICD-10-CM | POA: Diagnosis not present

## 2019-04-30 DIAGNOSIS — Z79899 Other long term (current) drug therapy: Secondary | ICD-10-CM | POA: Diagnosis not present

## 2019-04-30 DIAGNOSIS — R918 Other nonspecific abnormal finding of lung field: Secondary | ICD-10-CM

## 2019-04-30 DIAGNOSIS — Z833 Family history of diabetes mellitus: Secondary | ICD-10-CM | POA: Diagnosis not present

## 2019-04-30 NOTE — Addendum Note (Signed)
Encounter addended by: Manus Rudd, RN on: 04/30/2019 2:12 PM  Actions taken: Charge Capture section accepted

## 2019-04-30 NOTE — Progress Notes (Signed)
Radiation Oncology Follow up Note  Name: Lynn Robbins   Date:   04/30/2019 MRN:  [id number] DOB: 1941-01-22    This 79 y.o. female presents to the clinic today for 1 year follow-up status post SBRT to left upper lobe for stage Ib non-small cell lung cancer.  REFERRING PROVIDER: [name], MD  HPI: Patient is a 79 year old female now seen at 1 year having completed SBRT to her left upper lobe for a stage Ib (T2 a N0 M0) non-small cell lung cancer.  She is seen today in routine follow-up.  She was supposed to have a CT scan although according to patient it was canceled for some reason.  She has imaging later this month prior to her visit with Dr. Janese Banks and I have asked him to include a CT scan of her chest at that time.  She states she has some slight burning in her left upper chest not sure the etiology of that.  Her last CT scan which I have reviewed.  Showed a positive response to therapy with decreased decreased size of the left upper lobe nodule measuring 2.7 cm at that time.  She is on chronic nasal oxygen.  Wheelchair-bound.  COMPLICATIONS OF TREATMENT: none  FOLLOW UP COMPLIANCE: keeps appointments   PHYSICAL EXAM:  There were no vitals taken for this visit. Wheelchair-bound female on nasal oxygen in NAD.  Well-developed well-nourished patient in NAD. HEENT reveals PERLA, EOMI, discs not visualized.  Oral cavity is clear. No oral mucosal lesions are identified. Neck is clear without evidence of cervical or supraclavicular adenopathy. Lungs are clear to A&P. Cardiac examination is essentially unremarkable with regular rate and rhythm without murmur rub or thrill. Abdomen is benign with no organomegaly or masses noted. Motor sensory and DTR levels are equal and symmetric in the upper and lower extremities. Cranial nerves II through XII are grossly intact. Proprioception is intact. No peripheral adenopathy or edema is identified. No motor or sensory levels are noted. Crude visual fields are  within normal range. RADIOLOGY RESULTS: Previous CT scans reviewed we will review her CT scans to be performed next week  PLAN: Present time patient is doing fairly well.  She has a follow-up appointment at the earliest in April with Dr. Janese Banks at which time she will review CT scans of abdomen pelvis and I have added on CT scan of the chest at that time.  I will review those when they become available.  Otherwise asked to see her back in 6 months for follow-up.  We will await recommendations by Dr. Janese Banks.  I would like to take this opportunity to thank you for allowing me to participate in the care of your patient.Noreene Filbert, MD

## 2019-04-30 NOTE — Progress Notes (Signed)
Ct

## 2019-05-01 ENCOUNTER — Encounter: Payer: Self-pay | Admitting: Oncology

## 2019-05-01 NOTE — Progress Notes (Signed)
Hematology/Oncology Consult note [facility]  Telephone:([phone] [phone]  Patient Care Team: [name], MD as PCP - General (Internal Medicine) Telford Nab, RN as Registered Nurse   Name of the patient: Lynn Robbins  [id number]  1940-03-22   Date of visit: 05/01/19  Diagnosis- clinical stage I lung cancer left upper lobe cT2 N0 M0   Chief complaint/ Reason for visit-routine follow-up of lung cancer  Heme/Onc history: patient is a 79 year old female with a past medical history significant for COPD for which she is on continuous oxygen 2 L, hypertension, CHF who presented to the ER on 04/10/2018 with some symptoms of chest wall pain which led toCT chest and CT abdomen. CT scan showed left upper lobe spiculated mass highly concerning for primary lung cancer. There was also a second nodule in the right upper lobe which may represent synchronous tumor. No significant mediastinal adenopathy. This was followed by a PET CT scan on 04/14/2018 which showed hypermetabolic left upper lobe lung mass 3.5 x 2.3 cm with an SUV of 13.5. Second nodule in the right upper lobe measuring 14 mm which did not show any hypermetabolism. No hypermetabolic mediastinal lymph nodes.  Patient received SBRT to that the lesion  Interval history-patient has chronic fatigue and shortness of breath which is essentially unchanged.  Weight has remained stable.  She has not taken her Covid vaccine yet.  ECOG PS- 2 Pain scale- 0   Review of systems- Review of Systems  Constitutional: Positive for malaise/fatigue. Negative for chills, fever and weight loss.  HENT: Negative for congestion, ear discharge and nosebleeds.   Eyes: Negative for blurred vision.  Respiratory: Positive for shortness of breath. Negative for cough, hemoptysis, sputum production and wheezing.   Cardiovascular: Negative for chest pain, palpitations, orthopnea and claudication.  Gastrointestinal:  Negative for abdominal pain, blood in stool, constipation, diarrhea, heartburn, melena, nausea and vomiting.  Genitourinary: Negative for dysuria, flank pain, frequency, hematuria and urgency.  Musculoskeletal: Negative for back pain, joint pain and myalgias.  Skin: Negative for rash.  Neurological: Negative for dizziness, tingling, focal weakness, seizures, weakness and headaches.  Endo/Heme/Allergies: Does not bruise/bleed easily.  Psychiatric/Behavioral: Negative for depression and suicidal ideas. The patient does not have insomnia.       No Known Allergies   Past Medical History:  Diagnosis Date  . Cancer ([place])   . CHF (congestive heart failure) ([place])   . COPD (chronic obstructive pulmonary disease) ([place])   . Depression   . Diabetes mellitus without complication ([place])   . HOH (hard of hearing)   . Hypertension      Past Surgical History:  Procedure Laterality Date  . CESAREAN SECTION      Social History   Socioeconomic History  . Marital status: Single    Spouse name: Not on file  . Number of children: 5  . Years of education: Not on file  . Highest education level: Not on file  Occupational History  . Not on file  Tobacco Use  . Smoking status: Former Smoker    Packs/day: 3.00    Years: 57.00    Pack years: 171.00    Types: Cigarettes    Quit date: 04/17/1998    Years since quitting: 21.0  . Smokeless tobacco: Former Systems developer    Types: Snuff  Substance and Sexual Activity  . Alcohol use: No  . Drug use: No  . Sexual activity: Never  Other Topics Concern  . Not on  file  Social History Narrative   ** Merged History Encounter **       Social Determinants of Health   Financial Resource Strain:   . Difficulty of Paying Living Expenses:   Food Insecurity:   . Worried About Charity fundraiser in the Last Year:   . Arboriculturist in the Last Year:   Transportation Needs:   . Film/video editor (Medical):   Marland Kitchen Lack of Transportation (Non-Medical):     Physical Activity:   . Days of Exercise per Week:   . Minutes of Exercise per Session:   Stress:   . Feeling of Stress :   Social Connections:   . Frequency of Communication with Friends and Family:   . Frequency of Social Gatherings with Friends and Family:   . Attends Religious Services:   . Active Member of Clubs or Organizations:   . Attends Archivist Meetings:   Marland Kitchen Marital Status:   Intimate Partner Violence:   . Fear of Current or Ex-Partner:   . Emotionally Abused:   Marland Kitchen Physically Abused:   . Sexually Abused:     Family History  Problem Relation Age of Onset  . Diabetes Father   . Colon cancer Father   . Colon cancer Mother   . Aneurysm Mother 52       brain  . Lung cancer Sister   . Breast cancer Sister   . Lung cancer Brother   . Lung cancer Sister   . Bone cancer Brother   . Lung cancer Brother   . Cancer Sister        type unknown     Current Outpatient Medications:  .  Accu-Chek FastClix Lancets MISC, Use as directed to check sugars. DX E11.65, Disp: 306 each, Rfl: 3 .  albuterol (PROAIR HFA) 108 (90 Base) MCG/ACT inhaler, 2 PUFFS EVERY 4 HOURS AS NEEDED, Disp: 54 g, Rfl: 3 .  atenolol (TENORMIN) 25 MG tablet, Take 1 tablet (25 mg total) by mouth daily., Disp: 90 tablet, Rfl: 3 .  ciprofloxacin-dexamethasone (CIPRODEX) OTIC suspension, Place 4 drops into the right ear 2 (two) times daily., Disp: 7.5 mL, Rfl: 0 .  clonazePAM (KLONOPIN) 1 MG tablet, TAKE ONE TAB BY MOUTH TWICE DAILY FOR ANXIETY, Disp: 180 tablet, Rfl: 0 .  glimepiride (AMARYL) 2 MG tablet, Take 1 tablet (2 mg total) by mouth daily with breakfast., Disp: 90 tablet, Rfl: 3 .  HYDROcodone-acetaminophen (NORCO/VICODIN) 5-325 MG tablet, TAKE 1 TABLET BY MOUTH TWICE DAILY AS NEEDED FOR BACK PAIN, Disp: 60 tablet, Rfl: 0 .  losartan-hydrochlorothiazide (HYZAAR) 50-12.5 MG tablet, Take 1 tablet by mouth daily., Disp: 90 tablet, Rfl: 3 .  OXYGEN, Inhale into the lungs., Disp: , Rfl:  .   pantoprazole (PROTONIX) 40 MG tablet, Take 1 tablet (40 mg total) by mouth daily., Disp: 90 tablet, Rfl: 3 .  SitaGLIPtin-MetFORMIN HCl (JANUMET XR) 50-500 MG TB24, TAKE ONE TAB A DAY WITH FOOD FOR DIABETES, Disp: 90 tablet, Rfl: 3 .  tiotropium (SPIRIVA HANDIHALER) 18 MCG inhalation capsule, INHALE 1 CAPSULE VIA HANDIHALER ONCE DAILY AT THE SAME TIME EVERY DAY, Disp: 90 capsule, Rfl: 3  Physical exam:  Vitals:   04/30/19 0944 04/30/19 0946  BP:  120/72  Pulse:  83  Resp:  20  Temp:  (!) 95.2 F (35.1 C)  TempSrc:  Tympanic  Weight: 193 lb (87.5 kg)   PF:  (!) 2 L/min   Physical Exam Constitutional:  Comments: Frail elderly woman on home oxygen  HENT:     Head: Normocephalic and atraumatic.  Eyes:     Pupils: Pupils are equal, round, and reactive to light.  Cardiovascular:     Rate and Rhythm: Normal rate and regular rhythm.     Heart sounds: Normal heart sounds.  Pulmonary:     Comments: Breath sounds decreased bilaterally diffusely Abdominal:     General: Bowel sounds are normal.     Palpations: Abdomen is soft.  Musculoskeletal:     Cervical back: Normal range of motion.  Skin:    General: Skin is warm and dry.  Neurological:     Mental Status: She is alert and oriented to person, place, and time.      CMP Latest Ref Rng & Units 04/23/2019  Glucose 70 - 99 mg/dL 245(H)  BUN 8 - 23 mg/dL 24(H)  Creatinine 0.44 - 1.00 mg/dL 0.79  Sodium 135 - 145 mmol/L 139  Potassium 3.5 - 5.1 mmol/L 4.3  Chloride 98 - 111 mmol/L 102  CO2 22 - 32 mmol/L 27  Calcium 8.9 - 10.3 mg/dL 9.2  Total Protein 6.5 - 8.1 g/dL 7.3  Total Bilirubin 0.3 - 1.2 mg/dL 0.3  Alkaline Phos 38 - 126 U/L 79  AST 15 - 41 U/L 18  ALT 0 - 44 U/L 16   CBC Latest Ref Rng & Units 04/23/2019  WBC 4.0 - 10.5 K/uL 8.6  Hemoglobin 12.0 - 15.0 g/dL 11.3(L)  Hematocrit 36.0 - 46.0 % 38.2  Platelets 150 - 400 K/uL 269     Assessment and plan- Patient is a 79 y.o. female with stage I left upper lobe lung  cancer T2 a N0 M0 s/p SBRT here for routine follow-up  Patient was supposed to get a repeat CT chest done this month which she has not gotten done so far.we will go ahead and get that scheduled at this time. I will see ehr for video visit in 2 weeks to discuss CT results.  Patient has multiple risk factors including age, COPD and lung cancer and I have strongly recommended that she should take the covid vaccine     Visit Diagnosis 1. Encounter for follow-up surveillance of lung cancer      Dr. Randa Evens, MD, MPH [facility] [phone] 05/01/2019 1:41 PM

## 2019-05-03 ENCOUNTER — Telehealth: Payer: Self-pay

## 2019-05-03 ENCOUNTER — Other Ambulatory Visit: Payer: Self-pay | Admitting: Nurse Practitioner

## 2019-05-03 DIAGNOSIS — G894 Chronic pain syndrome: Secondary | ICD-10-CM

## 2019-05-03 MED ORDER — HYDROCODONE-ACETAMINOPHEN 5-325 MG PO TABS: ORAL_TABLET | ORAL | 0 refills | Status: DC

## 2019-05-03 NOTE — Telephone Encounter (Signed)
Refilled hydrocodone for 30 days per patient request

## 2019-05-03 NOTE — Progress Notes (Signed)
Refilled hydrocodone for 30 days per patient request

## 2019-05-06 DIAGNOSIS — J449 Chronic obstructive pulmonary disease, unspecified: Secondary | ICD-10-CM | POA: Diagnosis not present

## 2019-05-08 ENCOUNTER — Other Ambulatory Visit: Payer: Self-pay

## 2019-05-08 ENCOUNTER — Ambulatory Visit
Admission: RE | Admit: 2019-05-08 | Discharge: 2019-05-08 | Disposition: A | Discharge status: Home / Self Care | Payer: Medicare Other | Source: Ambulatory Visit | Attending: Oncology | Admitting: Oncology

## 2019-05-08 DIAGNOSIS — Z08 Encounter for follow-up examination after completed treatment for malignant neoplasm: Secondary | ICD-10-CM | POA: Diagnosis not present

## 2019-05-08 DIAGNOSIS — C3490 Malignant neoplasm of unspecified part of unspecified bronchus or lung: Secondary | ICD-10-CM | POA: Diagnosis not present

## 2019-05-08 DIAGNOSIS — K869 Disease of pancreas, unspecified: Secondary | ICD-10-CM | POA: Insufficient documentation

## 2019-05-08 DIAGNOSIS — I700 Atherosclerosis of aorta: Secondary | ICD-10-CM | POA: Insufficient documentation

## 2019-05-08 DIAGNOSIS — Z85118 Personal history of other malignant neoplasm of bronchus and lung: Secondary | ICD-10-CM | POA: Insufficient documentation

## 2019-05-08 DIAGNOSIS — N200 Calculus of kidney: Secondary | ICD-10-CM | POA: Diagnosis not present

## 2019-05-08 DIAGNOSIS — E279 Disorder of adrenal gland, unspecified: Secondary | ICD-10-CM | POA: Insufficient documentation

## 2019-05-08 DIAGNOSIS — J439 Emphysema, unspecified: Secondary | ICD-10-CM | POA: Diagnosis not present

## 2019-05-08 DIAGNOSIS — R918 Other nonspecific abnormal finding of lung field: Secondary | ICD-10-CM | POA: Insufficient documentation

## 2019-05-08 MED ORDER — IOHEXOL 300 MG/ML  SOLN
100.0000 mL | Freq: Once | INTRAMUSCULAR | Status: AC | PRN
  Administered 2019-05-08: 16:00:00 100 mL via INTRAVENOUS

## 2019-05-10 ENCOUNTER — Telehealth: Payer: Self-pay

## 2019-05-10 ENCOUNTER — Ambulatory Visit: Payer: Medicare Other | Admitting: Radiation Oncology

## 2019-05-10 ENCOUNTER — Telehealth: Payer: Self-pay | Admitting: *Deleted

## 2019-05-10 DIAGNOSIS — C3490 Malignant neoplasm of unspecified part of unspecified bronchus or lung: Secondary | ICD-10-CM

## 2019-05-10 NOTE — Telephone Encounter (Signed)
CMN SIGNED AND PLACED IN [place] FOLDER. °

## 2019-05-10 NOTE — Progress Notes (Signed)
You can cancel the 4/6 appt

## 2019-05-10 NOTE — Telephone Encounter (Signed)
Spoke with pt's daughter to make aware of recent CT scan results. Informed that per Dr. Janese Banks her CT scans look good and didn't show any lung cancer recurrence. Informed that she will need another follow up scan in 6 months and then follow up with Dr. Janese Banks 1-2 days. Appt on 4/6 will be cancelled at this time and pt's daughter made aware. Pt's daughter verbalized understanding. Nothing further needed at this time.

## 2019-05-22 ENCOUNTER — Telehealth: Payer: Medicare Other | Admitting: Oncology

## 2019-05-31 ENCOUNTER — Telehealth: Payer: Self-pay

## 2019-05-31 NOTE — Telephone Encounter (Signed)
CONFIRMED AND SCREENED FOR 06-04-19 OV.

## 2019-06-04 ENCOUNTER — Other Ambulatory Visit: Payer: Self-pay

## 2019-06-04 ENCOUNTER — Encounter: Payer: Self-pay | Admitting: Nurse Practitioner

## 2019-06-04 ENCOUNTER — Ambulatory Visit (INDEPENDENT_AMBULATORY_CARE_PROVIDER_SITE_OTHER): Payer: Medicare Other | Admitting: Nurse Practitioner

## 2019-06-04 VITALS — BP 136/53 | HR 73 | Temp 97.6°F | Resp 16 | Ht 60.0 in | Wt 187.0 lb

## 2019-06-04 DIAGNOSIS — E1165 Type 2 diabetes mellitus with hyperglycemia: Secondary | ICD-10-CM | POA: Diagnosis not present

## 2019-06-04 DIAGNOSIS — I10 Essential (primary) hypertension: Secondary | ICD-10-CM | POA: Diagnosis not present

## 2019-06-04 DIAGNOSIS — G894 Chronic pain syndrome: Secondary | ICD-10-CM

## 2019-06-04 DIAGNOSIS — F411 Generalized anxiety disorder: Secondary | ICD-10-CM

## 2019-06-04 DIAGNOSIS — J4520 Mild intermittent asthma, uncomplicated: Secondary | ICD-10-CM | POA: Diagnosis not present

## 2019-06-04 LAB — POCT GLYCOSYLATED HEMOGLOBIN (HGB A1C): Hemoglobin A1C: 6.6 % — AB (ref 4.0–5.6)

## 2019-06-04 MED ORDER — CLONAZEPAM 1 MG PO TABS: ORAL_TABLET | ORAL | 0 refills | Status: DC

## 2019-06-04 MED ORDER — HYDROCODONE-ACETAMINOPHEN 5-325 MG PO TABS: ORAL_TABLET | ORAL | 0 refills | Status: DC

## 2019-06-04 NOTE — Progress Notes (Signed)
[facility] [place], [place] [address]  Internal MEDICINE  Office Visit Note  Patient Name: Lynn Robbins  [id number]  [id number]  Date of Service: 06/04/2019  Chief Complaint  Patient presents with  . Diabetes  . Hypertension  . Medication Refill    hydrocodone, janumet, spiriva, protonix    The patient is here for routine follow up. Blood sugars are doing ok. Her HgbA1c is 6.6 today. She does need new prescription for pain medication. She takes this for chronic pain in her back, shoulder, and arm. Her pain can get up to 7/10 in severity. When she takes pain medication which brings pain down to 4/10 in severity. This allows her to have quality of life and participate in daily activities without negative side effects.  She did have CT of chest since her last visit. Was told that results were negative. She will see Dr. Donella Stade in May for routine follow up. She is scheduled to see Dr. Humphrey Rolls, pulmonology, later this month.  Patient has not received vaccination for COVID 19 and states that she is unwilling to do this at this time.       Current Medication: Outpatient Encounter Medications as of 06/04/2019  Medication Sig  . Accu-Chek FastClix Lancets MISC Use as directed to check sugars. DX E11.65  . albuterol (PROAIR HFA) 108 (90 Base) MCG/ACT inhaler 2 PUFFS EVERY 4 HOURS AS NEEDED  . atenolol (TENORMIN) 25 MG tablet Take 1 tablet (25 mg total) by mouth daily.  . ciprofloxacin-dexamethasone (CIPRODEX) OTIC suspension Place 4 drops into the right ear 2 (two) times daily.  . clonazePAM (KLONOPIN) 1 MG tablet TAKE ONE TAB BY MOUTH TWICE DAILY FOR ANXIETY  . glimepiride (AMARYL) 2 MG tablet Take 1 tablet (2 mg total) by mouth daily with breakfast.  . HYDROcodone-acetaminophen (NORCO/VICODIN) 5-325 MG tablet TAKE 1 TABLET BY MOUTH TWICE DAILY AS NEEDED FOR BACK PAIN  . losartan-hydrochlorothiazide (HYZAAR) 50-12.5 MG tablet Take 1 tablet by mouth daily.  . OXYGEN  Inhale into the lungs.  . pantoprazole (PROTONIX) 40 MG tablet Take 1 tablet (40 mg total) by mouth daily.  . SitaGLIPtin-MetFORMIN HCl (JANUMET XR) 50-500 MG TB24 TAKE ONE TAB A DAY WITH FOOD FOR DIABETES  . tiotropium (SPIRIVA HANDIHALER) 18 MCG inhalation capsule INHALE 1 CAPSULE VIA HANDIHALER ONCE DAILY AT THE SAME TIME EVERY DAY  . [DISCONTINUED] clonazePAM (KLONOPIN) 1 MG tablet TAKE ONE TAB BY MOUTH TWICE DAILY FOR ANXIETY  . [DISCONTINUED] HYDROcodone-acetaminophen (NORCO/VICODIN) 5-325 MG tablet TAKE 1 TABLET BY MOUTH TWICE DAILY AS NEEDED FOR BACK PAIN  . [DISCONTINUED] HYDROcodone-acetaminophen (NORCO/VICODIN) 5-325 MG tablet TAKE 1 TABLET BY MOUTH TWICE DAILY AS NEEDED FOR BACK PAIN  . [DISCONTINUED] HYDROcodone-acetaminophen (NORCO/VICODIN) 5-325 MG tablet TAKE 1 TABLET BY MOUTH TWICE DAILY AS NEEDED FOR BACK PAIN   No facility-administered encounter medications on file as of 06/04/2019.    Surgical History: Past Surgical History:  Procedure Laterality Date  . CESAREAN SECTION      Medical History: Past Medical History:  Diagnosis Date  . Cancer ([place])   . CHF (congestive heart failure) ([place])   . COPD (chronic obstructive pulmonary disease) ([place])   . Depression   . Diabetes mellitus without complication ([place])   . HOH (hard of hearing)   . Hypertension     Family History: Family History  Problem Relation Age of Onset  . Diabetes Father   . Colon cancer Father   . Colon cancer Mother   .  Aneurysm Mother 64       brain  . Lung cancer Sister   . Breast cancer Sister   . Lung cancer Brother   . Lung cancer Sister   . Bone cancer Brother   . Lung cancer Brother   . Cancer Sister        type unknown    Social History   Socioeconomic History  . Marital status: Single    Spouse name: Not on file  . Number of children: 5  . Years of education: Not on file  . Highest education level: Not on file  Occupational History  . Not on file  Tobacco Use  . Smoking  status: Former Smoker    Packs/day: 3.00    Years: 57.00    Pack years: 171.00    Types: Cigarettes    Quit date: 04/17/1998    Years since quitting: 21.1  . Smokeless tobacco: Former Systems developer    Types: Snuff  Substance and Sexual Activity  . Alcohol use: No  . Drug use: No  . Sexual activity: Never  Other Topics Concern  . Not on file  Social History Narrative   ** Merged History Encounter **       Social Determinants of Health   Financial Resource Strain:   . Difficulty of Paying Living Expenses:   Food Insecurity:   . Worried About Charity fundraiser in the Last Year:   . Arboriculturist in the Last Year:   Transportation Needs:   . Film/video editor (Medical):   Marland Kitchen Lack of Transportation (Non-Medical):   Physical Activity:   . Days of Exercise per Week:   . Minutes of Exercise per Session:   Stress:   . Feeling of Stress :   Social Connections:   . Frequency of Communication with Friends and Family:   . Frequency of Social Gatherings with Friends and Family:   . Attends Religious Services:   . Active Member of Clubs or Organizations:   . Attends Archivist Meetings:   Marland Kitchen Marital Status:   Intimate Partner Violence:   . Fear of Current or Ex-Partner:   . Emotionally Abused:   Marland Kitchen Physically Abused:   . Sexually Abused:       Review of Systems  Constitutional: Negative for activity change, chills, fatigue and unexpected weight change.  HENT: Negative for congestion, rhinorrhea, sneezing and sore throat.        Severe hearing loss.   Respiratory: Negative for cough, chest tightness, shortness of breath and wheezing.        Patient using nasal cannula oxygen at all times.   Cardiovascular: Negative for chest pain and palpitations.  Gastrointestinal: Negative for abdominal pain, constipation, diarrhea, nausea and vomiting.  Endocrine: Negative for cold intolerance, heat intolerance, polydipsia and polyuria.       Blood sugars doing well    Musculoskeletal: Negative for arthralgias, back pain, joint swelling and neck pain.  Skin: Negative for rash.  Neurological: Positive for weakness. Negative for tremors and numbness.  Hematological: Negative for adenopathy. Does not bruise/bleed easily.  Psychiatric/Behavioral: Negative for behavioral problems and sleep disturbance. The patient is nervous/anxious.     Today's Vitals   06/04/19 0934  BP: (!) 136/53  Pulse: 73  Resp: 16  Temp: 97.6 F (36.4 C)  SpO2: 98%  Weight: 187 lb (84.8 kg)  Height: 5' (1.524 m)   Body mass index is 36.52 kg/m.  Physical Exam Vitals and  nursing note reviewed.  Constitutional:      General: She is not in acute distress.    Appearance: Normal appearance. She is well-developed. She is obese. She is not diaphoretic.  HENT:     Head: Normocephalic and atraumatic.     Nose: Nose normal.     Mouth/Throat:     Pharynx: No oropharyngeal exudate.  Eyes:     Pupils: Pupils are equal, round, and reactive to light.  Neck:     Thyroid: No thyromegaly.     Vascular: No JVD.     Trachea: No tracheal deviation.  Cardiovascular:     Rate and Rhythm: Normal rate and regular rhythm.     Heart sounds: Normal heart sounds. No murmur. No friction rub. No gallop.   Pulmonary:     Effort: Pulmonary effort is normal. No respiratory distress.     Breath sounds: Normal breath sounds. No wheezing or rales.     Comments: The patient uses oxygen per nasal cannula at all times.  Chest:     Chest wall: No tenderness.  Abdominal:     Palpations: Abdomen is soft.  Musculoskeletal:        General: Normal range of motion.     Cervical back: Normal range of motion and neck supple.     Comments: The patient has moderate lower back pain, worse with bending and twisting at the waist. Uses a walker to help with ambulation.   Lymphadenopathy:     Cervical: No cervical adenopathy.  Skin:    General: Skin is warm and dry.  Neurological:     Mental Status: She is  alert and oriented to person, place, and time. Mental status is at baseline.     Cranial Nerves: No cranial nerve deficit.  Psychiatric:        Mood and Affect: Mood normal.        Behavior: Behavior normal.        Thought Content: Thought content normal.        Judgment: Judgment normal.    Assessment/Plan: 1. Type 2 diabetes mellitus with hyperglycemia, without long-term current use of insulin (HCC) - POCT HgB A1C 6.6 today. Continue diabetic medication as prescribed   2. Essential hypertension Stable. Continue bp medication as prescribed   3. Intermittent asthma without complication, unspecified asthma severity Currently stable. Use inhalers as prescribed. Use nasal cannula oxygen at all times.   4. Generalized anxiety disorder May take clonazepam 1mg  up to twice daily as needed for acute anxiety.  Single 90 day prescription provided today.  - clonazePAM (KLONOPIN) 1 MG tablet; TAKE ONE TAB BY MOUTH TWICE DAILY FOR ANXIETY  Dispense: 180 tablet; Refill: 0  5. Chronic pain disorder May take hydrocodone/APAP 5/325mg  up to twice daily. Three 30 day prescriptions were sent to her pharmacy. Dates are 06/04/2019, 07/02/2019, and 07/31/2019 - HYDROcodone-acetaminophen (NORCO/VICODIN) 5-325 MG tablet; TAKE 1 TABLET BY MOUTH TWICE DAILY AS NEEDED FOR BACK PAIN  Dispense: 60 tablet; Refill: 0  General Counseling: leta bucklin understanding of the findings of todays visit and agrees with plan of treatment. I have discussed any further diagnostic evaluation that may be needed or ordered today. We also reviewed her medications today. she has been encouraged to call the office with any questions or concerns that should arise related to todays visit.  Reviewed risks and possible side effects associated with taking opiates, benzodiazepines and other CNS depressants. Combination of these could cause dizziness and drowsiness. Advised patient not to  drive or operate machinery when taking these  medications, as patient's and other's life can be at risk and will have consequences. Patient verbalized understanding in this matter. Dependence and abuse for these drugs will be monitored closely. A Controlled substance policy and procedure is on file which allows [facility] to order a urine drug screen test at any visit. Patient understands and agrees with the plan  This patient was seen by Leretha Pol FNP Collaboration with Dr [name] as a part of collaborative care agreement  Orders Placed This Encounter  Procedures  . POCT HgB A1C    Meds ordered this encounter  Medications  . DISCONTD: HYDROcodone-acetaminophen (NORCO/VICODIN) 5-325 MG tablet    Sig: TAKE 1 TABLET BY MOUTH TWICE DAILY AS NEEDED FOR BACK PAIN    Dispense:  60 tablet    Refill:  0    Order Specific Question:   Supervising Provider    Answer:   [name] [place]  . clonazePAM (KLONOPIN) 1 MG tablet    Sig: TAKE ONE TAB BY MOUTH TWICE DAILY FOR ANXIETY    Dispense:  180 tablet    Refill:  0    Will be only given 90 day supply with no refills    Order Specific Question:   Supervising Provider    Answer:   [name] [place]  . DISCONTD: HYDROcodone-acetaminophen (NORCO/VICODIN) 5-325 MG tablet    Sig: TAKE 1 TABLET BY MOUTH TWICE DAILY AS NEEDED FOR BACK PAIN    Dispense:  60 tablet    Refill:  0    Fill after 07/02/2019    Order Specific Question:   Supervising Provider    Answer:   [name] [place]  . HYDROcodone-acetaminophen (NORCO/VICODIN) 5-325 MG tablet    Sig: TAKE 1 TABLET BY MOUTH TWICE DAILY AS NEEDED FOR BACK PAIN    Dispense:  60 tablet    Refill:  0    Fill after 07/31/2019    Order Specific Question:   Supervising Provider    Answer:   [name] [[id number]]    Total time spent: 30 Minutes   Time spent includes review of chart, medications, test results, and follow up plan with the patient.      Dr [name] Internal medicine

## 2019-06-06 DIAGNOSIS — J449 Chronic obstructive pulmonary disease, unspecified: Secondary | ICD-10-CM | POA: Diagnosis not present

## 2019-06-20 ENCOUNTER — Other Ambulatory Visit: Payer: Self-pay | Admitting: Adult Health

## 2019-06-20 DIAGNOSIS — J4520 Mild intermittent asthma, uncomplicated: Secondary | ICD-10-CM

## 2019-06-26 ENCOUNTER — Telehealth: Payer: Self-pay

## 2019-06-26 NOTE — Telephone Encounter (Signed)
Confirmed appointment on 06/28/2019 and screened for covid. [facility] 

## 2019-06-28 ENCOUNTER — Encounter: Payer: Self-pay | Admitting: Internal Medicine

## 2019-06-28 ENCOUNTER — Other Ambulatory Visit: Payer: Self-pay

## 2019-06-28 ENCOUNTER — Ambulatory Visit (INDEPENDENT_AMBULATORY_CARE_PROVIDER_SITE_OTHER): Payer: Medicare Other | Admitting: Internal Medicine

## 2019-06-28 VITALS — BP 145/67 | HR 85 | Temp 97.5°F | Resp 16 | Ht 60.0 in | Wt 190.0 lb

## 2019-06-28 DIAGNOSIS — Z9981 Dependence on supplemental oxygen: Secondary | ICD-10-CM

## 2019-06-28 DIAGNOSIS — J4520 Mild intermittent asthma, uncomplicated: Secondary | ICD-10-CM | POA: Diagnosis not present

## 2019-06-28 DIAGNOSIS — J449 Chronic obstructive pulmonary disease, unspecified: Secondary | ICD-10-CM

## 2019-06-28 DIAGNOSIS — C3492 Malignant neoplasm of unspecified part of left bronchus or lung: Secondary | ICD-10-CM

## 2019-06-28 NOTE — Progress Notes (Signed)
[facility] [place], [place] [address]  Pulmonary Sleep Medicine   Office Visit Note  Patient Name: Lynn Robbins DOB: Dec 09, 1940 MRN [id number]  Date of Service: 06/28/2019  Complaints/HPI: Pt is here for pulmonary follow up. She is having difficulty carrying her portable concentrator.  She currently has a [employer].  She would benefit from a lighter devices as she uses a walker and has weakness in her upper extremities. She has been at her baseline, and denies any worsening, or worrisome conditions.   ROS  General: (-) fever, (-) chills, (-) night sweats, (-) weakness Skin: (-) rashes, (-) itching,. Eyes: (-) visual changes, (-) redness, (-) itching. Nose and Sinuses: (-) nasal stuffiness or itchiness, (-) postnasal drip, (-) nosebleeds, (-) sinus trouble. Mouth and Throat: (-) sore throat, (-) hoarseness. Neck: (-) swollen glands, (-) enlarged thyroid, (-) neck pain. Respiratory: - cough, (-) bloody sputum, +shortness of breath, - wheezing. Cardiovascular: - ankle swelling, (-) chest pain. Lymphatic: (-) lymph node enlargement. Neurologic: (-) numbness, (-) tingling. Psychiatric: (-) anxiety, (-) depression   Current Medication: Outpatient Encounter Medications as of 06/28/2019  Medication Sig  . Accu-Chek FastClix Lancets MISC Use as directed to check sugars. DX E11.65  . albuterol (PROAIR HFA) 108 (90 Base) MCG/ACT inhaler TAKE 2 PUFFS BY MOUTH EVERY 4 HOURS AS NEEDED  . atenolol (TENORMIN) 25 MG tablet Take 1 tablet (25 mg total) by mouth daily.  . clonazePAM (KLONOPIN) 1 MG tablet TAKE ONE TAB BY MOUTH TWICE DAILY FOR ANXIETY  . glimepiride (AMARYL) 2 MG tablet Take 1 tablet (2 mg total) by mouth daily with breakfast.  . HYDROcodone-acetaminophen (NORCO/VICODIN) 5-325 MG tablet TAKE 1 TABLET BY MOUTH TWICE DAILY AS NEEDED FOR BACK PAIN  . losartan-hydrochlorothiazide (HYZAAR) 50-12.5 MG tablet Take 1 tablet by mouth daily.  . OXYGEN  Inhale into the lungs.  . pantoprazole (PROTONIX) 40 MG tablet Take 1 tablet (40 mg total) by mouth daily.  . SitaGLIPtin-MetFORMIN HCl (JANUMET XR) 50-500 MG TB24 TAKE ONE TAB A DAY WITH FOOD FOR DIABETES  . tiotropium (SPIRIVA HANDIHALER) 18 MCG inhalation capsule INHALE 1 CAPSULE VIA HANDIHALER ONCE DAILY AT THE SAME TIME EVERY DAY  . [DISCONTINUED] ciprofloxacin-dexamethasone (CIPRODEX) OTIC suspension Place 4 drops into the right ear 2 (two) times daily.   No facility-administered encounter medications on file as of 06/28/2019.    Surgical History: Past Surgical History:  Procedure Laterality Date  . CESAREAN SECTION      Medical History: Past Medical History:  Diagnosis Date  . Cancer ([place])   . CHF (congestive heart failure) ([place])   . COPD (chronic obstructive pulmonary disease) ([place])   . Depression   . Diabetes mellitus without complication ([place])   . HOH (hard of hearing)   . Hypertension     Family History: Family History  Problem Relation Age of Onset  . Diabetes Father   . Colon cancer Father   . Colon cancer Mother   . Aneurysm Mother 26       brain  . Lung cancer Sister   . Breast cancer Sister   . Lung cancer Brother   . Lung cancer Sister   . Bone cancer Brother   . Lung cancer Brother   . Cancer Sister        type unknown    Social History: Social History   Socioeconomic History  . Marital status: Single    Spouse name: Not on file  . Number of  children: 5  . Years of education: Not on file  . Highest education level: Not on file  Occupational History  . Not on file  Tobacco Use  . Smoking status: Former Smoker    Packs/day: 3.00    Years: 57.00    Pack years: 171.00    Types: Cigarettes    Quit date: 04/17/1998    Years since quitting: 21.2  . Smokeless tobacco: Former Systems developer    Types: Snuff  Substance and Sexual Activity  . Alcohol use: No  . Drug use: No  . Sexual activity: Never  Other Topics Concern  . Not on file  Social History  Narrative   ** Merged History Encounter **       Social Determinants of Health   Financial Resource Strain:   . Difficulty of Paying Living Expenses:   Food Insecurity:   . Worried About Charity fundraiser in the Last Year:   . Arboriculturist in the Last Year:   Transportation Needs:   . Film/video editor (Medical):   Marland Kitchen Lack of Transportation (Non-Medical):   Physical Activity:   . Days of Exercise per Week:   . Minutes of Exercise per Session:   Stress:   . Feeling of Stress :   Social Connections:   . Frequency of Communication with Friends and Family:   . Frequency of Social Gatherings with Friends and Family:   . Attends Religious Services:   . Active Member of Clubs or Organizations:   . Attends Archivist Meetings:   Marland Kitchen Marital Status:   Intimate Partner Violence:   . Fear of Current or Ex-Partner:   . Emotionally Abused:   Marland Kitchen Physically Abused:   . Sexually Abused:     Vital Signs: Blood pressure (!) 145/67, pulse 85, temperature (!) 97.5 F (36.4 C), resp. rate 16, height 5' (1.524 m), weight 190 lb (86.2 kg), SpO2 94 %.  Examination: General Appearance: The patient is well-developed, well-nourished, and in no distress. Skin: Gross inspection of skin unremarkable. Head: normocephalic, no gross deformities. Eyes: no gross deformities noted. ENT: ears appear grossly normal no exudates. Neck: Supple. No thyromegaly. No LAD. Respiratory: clear, with diminished bases. Cardiovascular: Normal S1 and S2 without murmur or rub. Extremities: No cyanosis. pulses are equal. Neurologic: Alert and oriented. No involuntary movements.  LABS: Recent Results (from the past 2160 hour(s))  CBC with Differential/Platelet     Status: Abnormal   Collection Time: 04/23/19  9:59 AM  Result Value Ref Range   WBC 8.6 4.0 - 10.5 K/uL   RBC 3.79 (L) 3.87 - 5.11 MIL/uL   Hemoglobin 11.3 (L) 12.0 - 15.0 g/dL   HCT 38.2 36.0 - 46.0 %   MCV 100.8 (H) 80.0 - 100.0 fL    MCH 29.8 26.0 - 34.0 pg   MCHC 29.6 (L) 30.0 - 36.0 g/dL   RDW 13.3 11.5 - 15.5 %   Platelets 269 150 - 400 K/uL   nRBC 0.0 0.0 - 0.2 %   Neutrophils Relative % 70 %   Neutro Abs 6.1 1.7 - 7.7 K/uL   Lymphocytes Relative 19 %   Lymphs Abs 1.6 0.7 - 4.0 K/uL   Monocytes Relative 7 %   Monocytes Absolute 0.6 0.1 - 1.0 K/uL   Eosinophils Relative 3 %   Eosinophils Absolute 0.2 0.0 - 0.5 K/uL   Basophils Relative 1 %   Basophils Absolute 0.1 0.0 - 0.1 K/uL   Immature Granulocytes  0 %   Abs Immature Granulocytes 0.03 0.00 - 0.07 K/uL    Comment: Performed at [facility], [address] [place], [place] [address]  Comprehensive metabolic panel     Status: Abnormal   Collection Time: 04/23/19  9:59 AM  Result Value Ref Range   Sodium 139 135 - 145 mmol/L   Potassium 4.3 3.5 - 5.1 mmol/L   Chloride 102 98 - 111 mmol/L   CO2 27 22 - 32 mmol/L   Glucose, Bld 245 (H) 70 - 99 mg/dL    Comment: Glucose reference range applies only to samples taken after fasting for at least 8 hours.   BUN 24 (H) 8 - 23 mg/dL   Creatinine, Ser 0.79 0.44 - 1.00 mg/dL   Calcium 9.2 8.9 - 10.3 mg/dL   Total Protein 7.3 6.5 - 8.1 g/dL   Albumin 3.7 3.5 - 5.0 g/dL   AST 18 15 - 41 U/L   ALT 16 0 - 44 U/L   Alkaline Phosphatase 79 38 - 126 U/L   Total Bilirubin 0.3 0.3 - 1.2 mg/dL   GFR calc non Af Amer >60 >60 mL/min   GFR calc Af Amer >60 >60 mL/min   Anion gap 10 5 - 15    Comment: Performed at [facility], [address] [place], [place] [address]  POCT HgB A1C     Status: Abnormal   Collection Time: 06/04/19 10:05 AM  Result Value Ref Range   Hemoglobin A1C 6.6 (A) 4.0 - 5.6 %   HbA1c POC (<> result, manual entry)     HbA1c, POC (prediabetic range)     HbA1c, POC (controlled diabetic range)      Radiology: CT Chest W Contrast  Result Date: 05/09/2019 CLINICAL DATA:  Stage I lung cancer. Restaging. EXAM: CT CHEST, ABDOMEN, AND PELVIS WITH CONTRAST TECHNIQUE: Multidetector CT  imaging of the chest, abdomen and pelvis was performed following the standard protocol during bolus administration of intravenous contrast. CONTRAST:  159mL OMNIPAQUE IOHEXOL 300 MG/ML  SOLN COMPARISON:  Chest CT 10/18/2018. Abdomen/pelvis CT 04/10/2018. FINDINGS: CT CHEST FINDINGS Cardiovascular: The heart size is normal. No substantial pericardial effusion. Atherosclerotic calcification is noted in the wall of the thoracic aorta. Enlargement of the pulmonary outflow tract and main pulmonary arteries suggests pulmonary arterial hypertension. Mediastinum/Nodes: No mediastinal lymphadenopathy. There is no hilar lymphadenopathy. The esophagus has normal imaging features. There is no axillary lymphadenopathy. Lungs/Pleura: Centrilobular and paraseptal emphysema evident. Left upper lobe pulmonary lesion continues to decrease in size measuring 2.2 x 1.4 cm today compared to 2.7 x 1.6 cm previously. Multinodular area of opacity in the central right upper lobe measured previously 1.7 x 0.6 cm is stable at 1.7 x 0.6 cm today (58/3). The tree-in-bud nodularity seen posterior right upper lobe on the previous study has decreased in the interval, likely sequelae of atypical infection. No new suspicious pulmonary nodule or mass. No pleural effusion. Musculoskeletal: No worrisome lytic or sclerotic osseous abnormality. CT ABDOMEN PELVIS FINDINGS Hepatobiliary: No suspicious focal abnormality within the liver parenchyma. There is no evidence for gallstones, gallbladder wall thickening, or pericholecystic fluid. No intrahepatic or extrahepatic biliary dilation. Pancreas: The diffuse pancreatic parenchymal atrophy with calcification and diffuse dilatation of the main duct is similar to prior. Multi lobular cystic lesion in the tail of pancreas measures 4.6 x 2.6 cm, stable when I remeasure in a similar fashion on the prior study. Spleen: No splenomegaly. No focal mass lesion. Adrenals/Urinary Tract: Stable bilateral adrenal  nodules  measuring up to 15 mm on the left. These nodules are not seen to be hypermetabolic on PET-CT of 16/11/9602. 8 mm nonobstructing stone noted lower pole right kidney. Tiny low-density lesions in both kidneys are too small to characterize but likely benign. No evidence for hydroureter. The urinary bladder appears normal for the degree of distention. Stomach/Bowel: Stomach is unremarkable. No gastric wall thickening. No evidence of outlet obstruction. Duodenum is normally positioned as is the ligament of Treitz. Large duodenal diverticulum noted. No small bowel wall thickening. No small bowel dilatation. The terminal ileum is normal. The appendix is normal. No gross colonic mass. No colonic wall thickening. Vascular/Lymphatic: There is abdominal aortic atherosclerosis without aneurysm. There is no gastrohepatic or hepatoduodenal ligament lymphadenopathy. No retroperitoneal or mesenteric lymphadenopathy. No pelvic sidewall lymphadenopathy. Reproductive: The uterus is unremarkable.  There is no adnexal mass. Other: No intraperitoneal free fluid. Musculoskeletal: No worrisome lytic or sclerotic osseous abnormality. IMPRESSION: 1. Continued further decrease in size of the left upper lobe pulmonary lesion. 2. Stable appearance of the central right upper lobe pulmonary nodule. 3. Interval decrease in tree-in-bud nodularity posterior right upper lobe, likely sequelae of atypical infection. 4. Stable bilateral adrenal nodules, not seen to be hypermetabolic on PET-CT of 54/10/8117. Continued attention on follow-up recommended. 5. No change in appearance of the lobulated cystic lesion in the tail of pancreas. 6. Nonobstructing right renal stone. 7. Enlargement of the pulmonary outflow tract and main pulmonary arteries suggests pulmonary arterial hypertension. 8. Aortic Atherosclerosis (ICD10-I70.0) and Emphysema (ICD10-J43.9). Electronically Signed   By: Misty Stanley M.D.   On: 05/09/2019 10:48   CT ABDOMEN PELVIS W  CONTRAST  Result Date: 05/09/2019 CLINICAL DATA:  Stage I lung cancer. Restaging. EXAM: CT CHEST, ABDOMEN, AND PELVIS WITH CONTRAST TECHNIQUE: Multidetector CT imaging of the chest, abdomen and pelvis was performed following the standard protocol during bolus administration of intravenous contrast. CONTRAST:  110mL OMNIPAQUE IOHEXOL 300 MG/ML  SOLN COMPARISON:  Chest CT 10/18/2018. Abdomen/pelvis CT 04/10/2018. FINDINGS: CT CHEST FINDINGS Cardiovascular: The heart size is normal. No substantial pericardial effusion. Atherosclerotic calcification is noted in the wall of the thoracic aorta. Enlargement of the pulmonary outflow tract and main pulmonary arteries suggests pulmonary arterial hypertension. Mediastinum/Nodes: No mediastinal lymphadenopathy. There is no hilar lymphadenopathy. The esophagus has normal imaging features. There is no axillary lymphadenopathy. Lungs/Pleura: Centrilobular and paraseptal emphysema evident. Left upper lobe pulmonary lesion continues to decrease in size measuring 2.2 x 1.4 cm today compared to 2.7 x 1.6 cm previously. Multinodular area of opacity in the central right upper lobe measured previously 1.7 x 0.6 cm is stable at 1.7 x 0.6 cm today (58/3). The tree-in-bud nodularity seen posterior right upper lobe on the previous study has decreased in the interval, likely sequelae of atypical infection. No new suspicious pulmonary nodule or mass. No pleural effusion. Musculoskeletal: No worrisome lytic or sclerotic osseous abnormality. CT ABDOMEN PELVIS FINDINGS Hepatobiliary: No suspicious focal abnormality within the liver parenchyma. There is no evidence for gallstones, gallbladder wall thickening, or pericholecystic fluid. No intrahepatic or extrahepatic biliary dilation. Pancreas: The diffuse pancreatic parenchymal atrophy with calcification and diffuse dilatation of the main duct is similar to prior. Multi lobular cystic lesion in the tail of pancreas measures 4.6 x 2.6 cm, stable  when I remeasure in a similar fashion on the prior study. Spleen: No splenomegaly. No focal mass lesion. Adrenals/Urinary Tract: Stable bilateral adrenal nodules measuring up to 15 mm on the left. These nodules are not seen to  be hypermetabolic on PET-CT of 49/44/9675. 8 mm nonobstructing stone noted lower pole right kidney. Tiny low-density lesions in both kidneys are too small to characterize but likely benign. No evidence for hydroureter. The urinary bladder appears normal for the degree of distention. Stomach/Bowel: Stomach is unremarkable. No gastric wall thickening. No evidence of outlet obstruction. Duodenum is normally positioned as is the ligament of Treitz. Large duodenal diverticulum noted. No small bowel wall thickening. No small bowel dilatation. The terminal ileum is normal. The appendix is normal. No gross colonic mass. No colonic wall thickening. Vascular/Lymphatic: There is abdominal aortic atherosclerosis without aneurysm. There is no gastrohepatic or hepatoduodenal ligament lymphadenopathy. No retroperitoneal or mesenteric lymphadenopathy. No pelvic sidewall lymphadenopathy. Reproductive: The uterus is unremarkable.  There is no adnexal mass. Other: No intraperitoneal free fluid. Musculoskeletal: No worrisome lytic or sclerotic osseous abnormality. IMPRESSION: 1. Continued further decrease in size of the left upper lobe pulmonary lesion. 2. Stable appearance of the central right upper lobe pulmonary nodule. 3. Interval decrease in tree-in-bud nodularity posterior right upper lobe, likely sequelae of atypical infection. 4. Stable bilateral adrenal nodules, not seen to be hypermetabolic on PET-CT of 91/63/8466. Continued attention on follow-up recommended. 5. No change in appearance of the lobulated cystic lesion in the tail of pancreas. 6. Nonobstructing right renal stone. 7. Enlargement of the pulmonary outflow tract and main pulmonary arteries suggests pulmonary arterial hypertension. 8. Aortic  Atherosclerosis (ICD10-I70.0) and Emphysema (ICD10-J43.9). Electronically Signed   By: Misty Stanley M.D.   On: 05/09/2019 10:48    No results found.  No results found.    Assessment and Plan: Patient Active Problem List   Diagnosis Date Noted  . Bilateral hearing loss 03/09/2019  . Generalized anxiety disorder 03/09/2019  . Goals of care, counseling/discussion 04/18/2018  . Mass of upper lobe of left lung 04/18/2018  . Intermittent asthma without complication 59/93/5701  . Chronic pain disorder 06/07/2017  . Hypertension 06/07/2017  . Mixed hyperlipidemia 02/03/2017  . Hypothyroidism 02/03/2017  . Type 2 diabetes mellitus with hyperglycemia ([place]) 02/03/2017  . Dysuria 02/03/2017    1. COPD, severe ([place]) Severe disease. Continue nebulizers and other medications as directed.    2. Supplemental oxygen dependent Continue to use oxygen 2-3 lpm continually. She would benefit from a light weight concentrator, as walking with a walker is making it difficult for her to carry her current concentrator.  3. Intermittent asthma without complication, unspecified asthma severity Stable, continue inhalers and meds as prescribed.  4. Non-small cell cancer of left lung ([place]) Continue to follow with oncology as discussed.   General Counseling: I have discussed the findings of the evaluation and examination with Michelyn.  I have also discussed any further diagnostic evaluation thatmay be needed or ordered today. Ceasia verbalizes understanding of the findings of todays visit. We also reviewed her medications today and discussed drug interactions and side effects including but not limited excessive drowsiness and altered mental states. We also discussed that there is always a risk not just to her but also people around her. she has been encouraged to call the office with any questions or concerns that should arise related to todays visit.  No orders of the defined types were placed in this  encounter.    Time spent: 30  I have personally obtained a history, examined the patient, evaluated laboratory and imaging results, formulated the assessment and plan and placed orders.    Allyne Gee, MD [facility] Pulmonary and Critical Care Sleep medicine

## 2019-07-06 DIAGNOSIS — J449 Chronic obstructive pulmonary disease, unspecified: Secondary | ICD-10-CM | POA: Diagnosis not present

## 2019-08-06 DIAGNOSIS — J449 Chronic obstructive pulmonary disease, unspecified: Secondary | ICD-10-CM | POA: Diagnosis not present

## 2019-08-30 ENCOUNTER — Telehealth: Payer: Self-pay

## 2019-08-30 NOTE — Telephone Encounter (Signed)
Lmom to confirm and screen for 09-03-19 ov.

## 2019-09-03 ENCOUNTER — Other Ambulatory Visit: Payer: Self-pay

## 2019-09-03 ENCOUNTER — Encounter: Payer: Self-pay | Admitting: Nurse Practitioner

## 2019-09-03 ENCOUNTER — Ambulatory Visit (INDEPENDENT_AMBULATORY_CARE_PROVIDER_SITE_OTHER): Payer: Medicare Other | Admitting: Nurse Practitioner

## 2019-09-03 VITALS — BP 142/83 | HR 77 | Temp 96.7°F | Resp 16 | Ht 60.0 in | Wt 184.2 lb

## 2019-09-03 DIAGNOSIS — J449 Chronic obstructive pulmonary disease, unspecified: Secondary | ICD-10-CM

## 2019-09-03 DIAGNOSIS — E1165 Type 2 diabetes mellitus with hyperglycemia: Secondary | ICD-10-CM

## 2019-09-03 DIAGNOSIS — Z9981 Dependence on supplemental oxygen: Secondary | ICD-10-CM | POA: Insufficient documentation

## 2019-09-03 DIAGNOSIS — F411 Generalized anxiety disorder: Secondary | ICD-10-CM

## 2019-09-03 DIAGNOSIS — I10 Essential (primary) hypertension: Secondary | ICD-10-CM | POA: Diagnosis not present

## 2019-09-03 DIAGNOSIS — G894 Chronic pain syndrome: Secondary | ICD-10-CM

## 2019-09-03 LAB — POCT GLYCOSYLATED HEMOGLOBIN (HGB A1C): Hemoglobin A1C: 6.1 % — AB (ref 4.0–5.6)

## 2019-09-03 MED ORDER — CLONAZEPAM 1 MG PO TABS: ORAL_TABLET | ORAL | 0 refills | Status: DC

## 2019-09-03 MED ORDER — HYDROCODONE-ACETAMINOPHEN 5-325 MG PO TABS: ORAL_TABLET | ORAL | 0 refills | Status: DC

## 2019-09-03 MED ORDER — GLIMEPIRIDE 2 MG PO TABS: 2.0000 mg | ORAL_TABLET | Freq: Every day | ORAL | 3 refills | Status: DC

## 2019-09-03 NOTE — Progress Notes (Addendum)
[facility] [place], [place] [address]  Internal MEDICINE  Office Visit Note  Patient Name: Lynn Robbins  [id number]  [id number]  Date of Service: 09/03/2019  Chief Complaint  Patient presents with  . Follow-up    recheck A1C  . Diabetes  . COPD  . Congestive Heart Failure  . Hypertension  . Quality Metric Gaps    TDAP, Eye exam    The patient is here for routine follow up. She is doing well. She was told that she is currently in remission from her cancer. She still has moderate shortness of breath, especially with exertion. She is un nasal cannula oxygen at all times. She will follow up again In 10/2019. Her blood sugars are doing very well. She has HgbA1c is 6.1 today.       Current Medication: Outpatient Encounter Medications as of 09/03/2019  Medication Sig  . Accu-Chek FastClix Lancets MISC Use as directed to check sugars. DX E11.65  . albuterol (PROAIR HFA) 108 (90 Base) MCG/ACT inhaler TAKE 2 PUFFS BY MOUTH EVERY 4 HOURS AS NEEDED  . atenolol (TENORMIN) 25 MG tablet Take 1 tablet (25 mg total) by mouth daily.  . clonazePAM (KLONOPIN) 1 MG tablet TAKE ONE TAB BY MOUTH TWICE DAILY FOR ANXIETY  . glimepiride (AMARYL) 2 MG tablet Take 1 tablet (2 mg total) by mouth daily with breakfast.  . HYDROcodone-acetaminophen (NORCO/VICODIN) 5-325 MG tablet TAKE 1 TABLET BY MOUTH TWICE DAILY AS NEEDED FOR BACK PAIN  . losartan-hydrochlorothiazide (HYZAAR) 50-12.5 MG tablet Take 1 tablet by mouth daily.  . OXYGEN Inhale into the lungs.  . pantoprazole (PROTONIX) 40 MG tablet Take 1 tablet (40 mg total) by mouth daily.  . SitaGLIPtin-MetFORMIN HCl (JANUMET XR) 50-500 MG TB24 TAKE ONE TAB A DAY WITH FOOD FOR DIABETES  . tiotropium (SPIRIVA HANDIHALER) 18 MCG inhalation capsule INHALE 1 CAPSULE VIA HANDIHALER ONCE DAILY AT THE SAME TIME EVERY DAY  . [DISCONTINUED] clonazePAM (KLONOPIN) 1 MG tablet TAKE ONE TAB BY MOUTH TWICE DAILY FOR ANXIETY  . [DISCONTINUED]  glimepiride (AMARYL) 2 MG tablet Take 1 tablet (2 mg total) by mouth daily with breakfast.  . [DISCONTINUED] HYDROcodone-acetaminophen (NORCO/VICODIN) 5-325 MG tablet TAKE 1 TABLET BY MOUTH TWICE DAILY AS NEEDED FOR BACK PAIN  . [DISCONTINUED] HYDROcodone-acetaminophen (NORCO/VICODIN) 5-325 MG tablet TAKE 1 TABLET BY MOUTH TWICE DAILY AS NEEDED FOR BACK PAIN  . [DISCONTINUED] HYDROcodone-acetaminophen (NORCO/VICODIN) 5-325 MG tablet TAKE 1 TABLET BY MOUTH TWICE DAILY AS NEEDED FOR BACK PAIN   No facility-administered encounter medications on file as of 09/03/2019.    Surgical History: Past Surgical History:  Procedure Laterality Date  . CESAREAN SECTION      Medical History: Past Medical History:  Diagnosis Date  . Cancer ([place])   . CHF (congestive heart failure) ([place])   . COPD (chronic obstructive pulmonary disease) ([place])   . Depression   . Diabetes mellitus without complication ([place])   . HOH (hard of hearing)   . Hypertension     Family History: Family History  Problem Relation Age of Onset  . Diabetes Father   . Colon cancer Father   . Colon cancer Mother   . Aneurysm Mother 51       brain  . Lung cancer Sister   . Breast cancer Sister   . Lung cancer Brother   . Lung cancer Sister   . Bone cancer Brother   . Lung cancer Brother   . Cancer Sister  type unknown    Social History   Socioeconomic History  . Marital status: Single    Spouse name: Not on file  . Number of children: 5  . Years of education: Not on file  . Highest education level: Not on file  Occupational History  . Not on file  Tobacco Use  . Smoking status: Former Smoker    Packs/day: 3.00    Years: 57.00    Pack years: 171.00    Types: Cigarettes    Quit date: 04/17/1998    Years since quitting: 21.3  . Smokeless tobacco: Former Systems developer    Types: Snuff  Vaping Use  . Vaping Use: Never used  Substance and Sexual Activity  . Alcohol use: No  . Drug use: No  . Sexual activity: Never   Other Topics Concern  . Not on file  Social History Narrative   ** Merged History Encounter **       Social Determinants of Health   Financial Resource Strain:   . Difficulty of Paying Living Expenses:   Food Insecurity:   . Worried About Charity fundraiser in the Last Year:   . Arboriculturist in the Last Year:   Transportation Needs:   . Film/video editor (Medical):   Marland Kitchen Lack of Transportation (Non-Medical):   Physical Activity:   . Days of Exercise per Week:   . Minutes of Exercise per Session:   Stress:   . Feeling of Stress :   Social Connections:   . Frequency of Communication with Friends and Family:   . Frequency of Social Gatherings with Friends and Family:   . Attends Religious Services:   . Active Member of Clubs or Organizations:   . Attends Archivist Meetings:   Marland Kitchen Marital Status:   Intimate Partner Violence:   . Fear of Current or Ex-Partner:   . Emotionally Abused:   Marland Kitchen Physically Abused:   . Sexually Abused:       Review of Systems  Constitutional: Positive for fatigue. Negative for activity change, chills and unexpected weight change.  HENT: Negative for congestion, rhinorrhea, sneezing and sore throat.        Severe hearing loss.   Respiratory: Negative for cough, chest tightness, shortness of breath and wheezing.        Patient using nasal cannula oxygen at all times.   Cardiovascular: Negative for chest pain and palpitations.  Gastrointestinal: Negative for abdominal pain, constipation, diarrhea, nausea and vomiting.  Endocrine: Negative for cold intolerance, heat intolerance, polydipsia and polyuria.       Blood sugars doing well   Musculoskeletal: Positive for arthralgias, back pain, joint swelling and myalgias. Negative for neck pain.  Skin: Negative for rash.  Neurological: Positive for weakness. Negative for tremors and numbness.  Hematological: Negative for adenopathy. Does not bruise/bleed easily.  Psychiatric/Behavioral:  Negative for behavioral problems and sleep disturbance. The patient is nervous/anxious.     Today's Vitals   09/03/19 0844  BP: (!) 142/83  Pulse: 77  Resp: 16  Temp: (!) 96.7 F (35.9 C)  SpO2: 96%  Weight: 184 lb 3.2 oz (83.6 kg)  Height: 5' (1.524 m)   Body mass index is 35.97 kg/m.  Physical Exam Vitals and nursing note reviewed.  Constitutional:      General: She is not in acute distress.    Appearance: Normal appearance. She is well-developed. She is obese. She is not diaphoretic.  HENT:     Head:  Normocephalic and atraumatic.     Nose: Nose normal.     Mouth/Throat:     Pharynx: No oropharyngeal exudate.  Eyes:     Pupils: Pupils are equal, round, and reactive to light.  Neck:     Thyroid: No thyromegaly.     Vascular: No JVD.     Trachea: No tracheal deviation.  Cardiovascular:     Rate and Rhythm: Normal rate and regular rhythm.     Heart sounds: Normal heart sounds. No murmur heard.  No friction rub. No gallop.   Pulmonary:     Effort: Pulmonary effort is normal. No respiratory distress.     Breath sounds: Wheezing present. No rales.     Comments: The patient uses oxygen per nasal cannula at all times. She has some inspiratory and expiratory wheezing throughout the lung fields.  Chest:     Chest wall: No tenderness.  Abdominal:     Palpations: Abdomen is soft.  Musculoskeletal:        General: Normal range of motion.     Cervical back: Normal range of motion and neck supple.     Comments: The patient has moderate lower back pain, worse with bending and twisting at the waist. Uses a walker to help with ambulation.   Lymphadenopathy:     Cervical: No cervical adenopathy.  Skin:    General: Skin is warm and dry.  Neurological:     Mental Status: She is alert and oriented to person, place, and time. Mental status is at baseline.     Cranial Nerves: No cranial nerve deficit.  Psychiatric:        Mood and Affect: Mood normal.        Behavior: Behavior  normal.        Thought Content: Thought content normal.        Judgment: Judgment normal.    Assessment/Plan: 1. Type 2 diabetes mellitus with hyperglycemia, without long-term current use of insulin (HCC) - POCT HgB A1C 6.1 today. Continue diabetic medication as prescribed. Refer for diabetic eye exam.  - glimepiride (AMARYL) 2 MG tablet; Take 1 tablet (2 mg total) by mouth daily with breakfast.  Dispense: 90 tablet; Refill: 3 - Ambulatory referral to Ophthalmology  2. Essential hypertension Stable. Continue bp medication as prescribed   3. Chronic obstructive pulmonary disease, unspecified COPD type ([place]) Stable. Continue inhalers and respiratory medications as prescribed   4. Supplemental oxygen dependent Use nasal cannula oxygen at all times.   5. Generalized anxiety disorder May take clonazepam 1mg  up to twice daily as needed for acute anxiety. New prescription sent to her pharmacy today.  - clonazePAM (KLONOPIN) 1 MG tablet; TAKE ONE TAB BY MOUTH TWICE DAILY FOR ANXIETY  Dispense: 180 tablet; Refill: 0  6. Chronic pain disorder May take hydrocodone/APAP 5/325mg  up to twice daily as needed for back pain. Three 30 day prescriptions provided. Dates are 09/03/2019, 10/02/2019, and 10/31/2019 - HYDROcodone-acetaminophen (NORCO/VICODIN) 5-325 MG tablet; TAKE 1 TABLET BY MOUTH TWICE DAILY AS NEEDED FOR BACK PAIN  Dispense: 60 tablet; Refill: 0  General Counseling: abriana saltos understanding of the findings of todays visit and agrees with plan of treatment. I have discussed any further diagnostic evaluation that may be needed or ordered today. We also reviewed her medications today. she has been encouraged to call the office with any questions or concerns that should arise related to todays visit.  Reviewed risks and possible side effects associated with taking opiates, benzodiazepines and other CNS  depressants. Combination of these could cause dizziness and drowsiness. Advised patient  not to drive or operate machinery when taking these medications, as patient's and other's life can be at risk and will have consequences. Patient verbalized understanding in this matter. Dependence and abuse for these drugs will be monitored closely. A Controlled substance policy and procedure is on file which allows [facility] to order a urine drug screen test at any visit. Patient understands and agrees with the plan  This patient was seen by Leretha Pol FNP Collaboration with Dr [name] as a part of collaborative care agreement  Orders Placed This Encounter  Procedures  . Ambulatory referral to Ophthalmology  . POCT HgB A1C    Meds ordered this encounter  Medications  . clonazePAM (KLONOPIN) 1 MG tablet    Sig: TAKE ONE TAB BY MOUTH TWICE DAILY FOR ANXIETY    Dispense:  180 tablet    Refill:  0    Will be only given 90 day supply with no refills    Order Specific Question:   Supervising Provider    Answer:   [name] [employer]  . DISCONTD: HYDROcodone-acetaminophen (NORCO/VICODIN) 5-325 MG tablet    Sig: TAKE 1 TABLET BY MOUTH TWICE DAILY AS NEEDED FOR BACK PAIN    Dispense:  60 tablet    Refill:  0    Please fill now, as patient will be picking this up on her way home from her current appointment.    Order Specific Question:   Supervising Provider    Answer:   [name] [[id number]]  . glimepiride (AMARYL) 2 MG tablet    Sig: Take 1 tablet (2 mg total) by mouth daily with breakfast.    Dispense:  90 tablet    Refill:  3    Order Specific Question:   Supervising Provider    Answer:   [name] [place]  . DISCONTD: HYDROcodone-acetaminophen (NORCO/VICODIN) 5-325 MG tablet    Sig: TAKE 1 TABLET BY MOUTH TWICE DAILY AS NEEDED FOR BACK PAIN    Dispense:  60 tablet    Refill:  0    Please fill now, as patient will be picking this up on her way home from her current appointment.fill after 8/17/2.021    Order Specific Question:   Supervising Provider     Answer:   [name] [[id number]]  . HYDROcodone-acetaminophen (NORCO/VICODIN) 5-325 MG tablet    Sig: TAKE 1 TABLET BY MOUTH TWICE DAILY AS NEEDED FOR BACK PAIN    Dispense:  60 tablet    Refill:  0    Fill after 10/31/2019    Order Specific Question:   Supervising Provider    Answer:   [name] [[id number]]    Total time spent: 30 Minutes   Time spent includes review of chart, medications, test results, and follow up plan with the patient.      Dr [name] Internal medicine

## 2019-09-05 DIAGNOSIS — J449 Chronic obstructive pulmonary disease, unspecified: Secondary | ICD-10-CM | POA: Diagnosis not present

## 2019-10-03 ENCOUNTER — Other Ambulatory Visit: Payer: Self-pay | Admitting: Internal Medicine

## 2019-10-06 DIAGNOSIS — J449 Chronic obstructive pulmonary disease, unspecified: Secondary | ICD-10-CM | POA: Diagnosis not present

## 2019-10-25 ENCOUNTER — Telehealth: Payer: Self-pay

## 2019-10-25 NOTE — Telephone Encounter (Signed)
[facility] CONFIRMED PT APPT 10/29/19. BR

## 2019-10-29 ENCOUNTER — Other Ambulatory Visit: Payer: Self-pay

## 2019-10-29 ENCOUNTER — Ambulatory Visit (INDEPENDENT_AMBULATORY_CARE_PROVIDER_SITE_OTHER): Payer: Medicare Other | Admitting: Internal Medicine

## 2019-10-29 ENCOUNTER — Other Ambulatory Visit: Payer: Self-pay | Admitting: Adult Health

## 2019-10-29 ENCOUNTER — Encounter: Payer: Self-pay | Admitting: Internal Medicine

## 2019-10-29 VITALS — BP 125/65 | HR 73 | Temp 98.5°F | Resp 16 | Ht 60.0 in | Wt 183.8 lb

## 2019-10-29 DIAGNOSIS — J449 Chronic obstructive pulmonary disease, unspecified: Secondary | ICD-10-CM

## 2019-10-29 DIAGNOSIS — Z9981 Dependence on supplemental oxygen: Secondary | ICD-10-CM

## 2019-10-29 DIAGNOSIS — J4520 Mild intermittent asthma, uncomplicated: Secondary | ICD-10-CM | POA: Diagnosis not present

## 2019-10-29 DIAGNOSIS — I10 Essential (primary) hypertension: Secondary | ICD-10-CM

## 2019-10-29 NOTE — Progress Notes (Signed)
[facility] [place],  [address]  Pulmonary Sleep Medicine   Office Visit Note  Patient Name: Lynn Robbins DOB: 1940/09/22 MRN [id number]  Date of Service: 10/29/2019  Complaints/HPI: Pt is seen for pulmonary follow up.  She reports her breathing is at baseline. She continues to use oxygen 2 lpm all that time. Using inhalers as prescribed.  Denies any new or concerning symptoms.     ROS  General: (-) fever, (-) chills, (-) night sweats, (-) weakness Skin: (-) rashes, (-) itching,. Eyes: (-) visual changes, (-) redness, (-) itching. Nose and Sinuses: (-) nasal stuffiness or itchiness, (-) postnasal drip, (-) nosebleeds, (-) sinus trouble. Mouth and Throat: (-) sore throat, (-) hoarseness. Neck: (-) swollen glands, (-) enlarged thyroid, (-) neck pain. Respiratory: - cough, (-) bloody sputum, - shortness of breath, - wheezing. Cardiovascular: - ankle swelling, (-) chest pain. Lymphatic: (-) lymph node enlargement. Neurologic: (-) numbness, (-) tingling. Psychiatric: (-) anxiety, (-) depression   Current Medication: Outpatient Encounter Medications as of 10/29/2019  Medication Sig   Accu-Chek FastClix Lancets MISC Use as directed to check sugars. DX E11.65   albuterol (PROAIR HFA) 108 (90 Base) MCG/ACT inhaler TAKE 2 PUFFS BY MOUTH EVERY 4 HOURS AS NEEDED   atenolol (TENORMIN) 25 MG tablet Take 1 tablet (25 mg total) by mouth daily.   clonazePAM (KLONOPIN) 1 MG tablet TAKE ONE TAB BY MOUTH TWICE DAILY FOR ANXIETY   glimepiride (AMARYL) 2 MG tablet Take 1 tablet (2 mg total) by mouth daily with breakfast.   HYDROcodone-acetaminophen (NORCO/VICODIN) 5-325 MG tablet TAKE 1 TABLET BY MOUTH TWICE DAILY AS NEEDED FOR BACK PAIN   losartan-hydrochlorothiazide (HYZAAR) 50-12.5 MG tablet TAKE 1 TABLET BY MOUTH DAILY   OXYGEN Inhale into the lungs.   pantoprazole (PROTONIX) 40 MG tablet Take 1 tablet (40 mg total) by mouth daily.    SitaGLIPtin-MetFORMIN HCl (JANUMET XR) 50-500 MG TB24 TAKE ONE TABLET EVERY DAY WITH FOOD FOR DIABETES   tiotropium (SPIRIVA HANDIHALER) 18 MCG inhalation capsule INHALE 1 CAPSULE VIA HANDIHALER ONCE DAILY AT THE SAME TIME EVERY DAY   No facility-administered encounter medications on file as of 10/29/2019.    Surgical History: Past Surgical History:  Procedure Laterality Date   CESAREAN SECTION      Medical History: Past Medical History:  Diagnosis Date   Cancer ([place])    CHF (congestive heart failure) ([facility])    COPD (chronic obstructive pulmonary disease) ([place])    Depression    Diabetes mellitus without complication ([facility])    HOH (hard of hearing)    Hypertension     Family History: Family History  Problem Relation Age of Onset   Diabetes Father    Colon cancer Father    Colon cancer Mother    Aneurysm Mother 73       brain   Lung cancer Sister    Breast cancer Sister    Lung cancer Brother    Lung cancer Sister    Bone cancer Brother    Lung cancer Brother    Cancer Sister        type unknown    Social History: Social History   Socioeconomic History   Marital status: Single    Spouse name: Not on file   Number of children: 5   Years of education: Not on file   Highest education level: Not on file  Occupational History   Not on file  Tobacco Use   Smoking status: Former  Smoker    Packs/day: 3.00    Years: 57.00    Pack years: 171.00    Types: Cigarettes    Quit date: 04/17/1998    Years since quitting: 21.5   Smokeless tobacco: Former Systems developer    Types: Snuff  Vaping Use   Vaping Use: Never used  Substance and Sexual Activity   Alcohol use: No   Drug use: No   Sexual activity: Never  Other Topics Concern   Not on file  Social History Narrative   ** Merged History Encounter **       Social Determinants of Health   Financial Resource Strain:    Difficulty of Paying Living Expenses: Not on file  Food Insecurity:     Worried About Charity fundraiser in the Last Year: Not on file   [employer] of Food in the Last Year: Not on file  Transportation Needs:    Lack of Transportation (Medical): Not on file   Lack of Transportation (Non-Medical): Not on file  Physical Activity:    Days of Exercise per Week: Not on file   Minutes of Exercise per Session: Not on file  Stress:    Feeling of Stress : Not on file  Social Connections:    Frequency of Communication with Friends and Family: Not on file   Frequency of Social Gatherings with Friends and Family: Not on file   Attends Religious Services: Not on file   Active Member of Clubs or Organizations: Not on file   Attends Archivist Meetings: Not on file   Marital Status: Not on file  Intimate Partner Violence:    Fear of Current or Ex-Partner: Not on file   Emotionally Abused: Not on file   Physically Abused: Not on file   Sexually Abused: Not on file    Vital Signs: Blood pressure 125/65, pulse 73, temperature 98.5 F (36.9 C), resp. rate 16, height 5' (1.524 m), weight 183 lb 12.8 oz (83.4 kg), SpO2 93 %.  Examination: General Appearance: The patient is well-developed, well-nourished, and in no distress. Skin: Gross inspection of skin unremarkable. Head: normocephalic, no gross deformities. Eyes: no gross deformities noted. ENT: ears appear grossly normal no exudates. Neck: Supple. No thyromegaly. No LAD. Respiratory: course breath sounds. Cardiovascular: Normal S1 and S2 without murmur or rub. Extremities: No cyanosis. pulses are equal. Neurologic: Alert and oriented. No involuntary movements.  LABS: Recent Results (from the past 2160 hour(s))  POCT HgB A1C     Status: Abnormal   Collection Time: 09/03/19  9:12 AM  Result Value Ref Range   Hemoglobin A1C 6.1 (A) 4.0 - 5.6 %   HbA1c POC (<> result, manual entry)     HbA1c, POC (prediabetic range)     HbA1c, POC (controlled diabetic range)      Radiology: CT Chest  W Contrast  Result Date: 05/09/2019 CLINICAL DATA:  Stage I lung cancer. Restaging. EXAM: CT CHEST, ABDOMEN, AND PELVIS WITH CONTRAST TECHNIQUE: Multidetector CT imaging of the chest, abdomen and pelvis was performed following the standard protocol during bolus administration of intravenous contrast. CONTRAST:  163mL OMNIPAQUE IOHEXOL 300 MG/ML  SOLN COMPARISON:  Chest CT 10/18/2018. Abdomen/pelvis CT 04/10/2018. FINDINGS: CT CHEST FINDINGS Cardiovascular: The heart size is normal. No substantial pericardial effusion. Atherosclerotic calcification is noted in the wall of the thoracic aorta. Enlargement of the pulmonary outflow tract and main pulmonary arteries suggests pulmonary arterial hypertension. Mediastinum/Nodes: No mediastinal lymphadenopathy. There is no hilar lymphadenopathy. The esophagus  has normal imaging features. There is no axillary lymphadenopathy. Lungs/Pleura: Centrilobular and paraseptal emphysema evident. Left upper lobe pulmonary lesion continues to decrease in size measuring 2.2 x 1.4 cm today compared to 2.7 x 1.6 cm previously. Multinodular area of opacity in the central right upper lobe measured previously 1.7 x 0.6 cm is stable at 1.7 x 0.6 cm today (58/3). The tree-in-bud nodularity seen posterior right upper lobe on the previous study has decreased in the interval, likely sequelae of atypical infection. No new suspicious pulmonary nodule or mass. No pleural effusion. Musculoskeletal: No worrisome lytic or sclerotic osseous abnormality. CT ABDOMEN PELVIS FINDINGS Hepatobiliary: No suspicious focal abnormality within the liver parenchyma. There is no evidence for gallstones, gallbladder wall thickening, or pericholecystic fluid. No intrahepatic or extrahepatic biliary dilation. Pancreas: The diffuse pancreatic parenchymal atrophy with calcification and diffuse dilatation of the main duct is similar to prior. Multi lobular cystic lesion in the tail of pancreas measures 4.6 x 2.6 cm,  stable when I remeasure in a similar fashion on the prior study. Spleen: No splenomegaly. No focal mass lesion. Adrenals/Urinary Tract: Stable bilateral adrenal nodules measuring up to 15 mm on the left. These nodules are not seen to be hypermetabolic on PET-CT of 83/38/2505. 8 mm nonobstructing stone noted lower pole right kidney. Tiny low-density lesions in both kidneys are too small to characterize but likely benign. No evidence for hydroureter. The urinary bladder appears normal for the degree of distention. Stomach/Bowel: Stomach is unremarkable. No gastric wall thickening. No evidence of outlet obstruction. Duodenum is normally positioned as is the ligament of Treitz. Large duodenal diverticulum noted. No small bowel wall thickening. No small bowel dilatation. The terminal ileum is normal. The appendix is normal. No gross colonic mass. No colonic wall thickening. Vascular/Lymphatic: There is abdominal aortic atherosclerosis without aneurysm. There is no gastrohepatic or hepatoduodenal ligament lymphadenopathy. No retroperitoneal or mesenteric lymphadenopathy. No pelvic sidewall lymphadenopathy. Reproductive: The uterus is unremarkable.  There is no adnexal mass. Other: No intraperitoneal free fluid. Musculoskeletal: No worrisome lytic or sclerotic osseous abnormality. IMPRESSION: 1. Continued further decrease in size of the left upper lobe pulmonary lesion. 2. Stable appearance of the central right upper lobe pulmonary nodule. 3. Interval decrease in tree-in-bud nodularity posterior right upper lobe, likely sequelae of atypical infection. 4. Stable bilateral adrenal nodules, not seen to be hypermetabolic on PET-CT of 39/76/7341. Continued attention on follow-up recommended. 5. No change in appearance of the lobulated cystic lesion in the tail of pancreas. 6. Nonobstructing right renal stone. 7. Enlargement of the pulmonary outflow tract and main pulmonary arteries suggests pulmonary arterial hypertension. 8.  Aortic Atherosclerosis (ICD10-I70.0) and Emphysema (ICD10-J43.9). Electronically Signed   By: Misty Stanley M.D.   On: 05/09/2019 10:48   CT ABDOMEN PELVIS W CONTRAST  Result Date: 05/09/2019 CLINICAL DATA:  Stage I lung cancer. Restaging. EXAM: CT CHEST, ABDOMEN, AND PELVIS WITH CONTRAST TECHNIQUE: Multidetector CT imaging of the chest, abdomen and pelvis was performed following the standard protocol during bolus administration of intravenous contrast. CONTRAST:  159mL OMNIPAQUE IOHEXOL 300 MG/ML  SOLN COMPARISON:  Chest CT 10/18/2018. Abdomen/pelvis CT 04/10/2018. FINDINGS: CT CHEST FINDINGS Cardiovascular: The heart size is normal. No substantial pericardial effusion. Atherosclerotic calcification is noted in the wall of the thoracic aorta. Enlargement of the pulmonary outflow tract and main pulmonary arteries suggests pulmonary arterial hypertension. Mediastinum/Nodes: No mediastinal lymphadenopathy. There is no hilar lymphadenopathy. The esophagus has normal imaging features. There is no axillary lymphadenopathy. Lungs/Pleura: Centrilobular and paraseptal emphysema evident. Left  upper lobe pulmonary lesion continues to decrease in size measuring 2.2 x 1.4 cm today compared to 2.7 x 1.6 cm previously. Multinodular area of opacity in the central right upper lobe measured previously 1.7 x 0.6 cm is stable at 1.7 x 0.6 cm today (58/3). The tree-in-bud nodularity seen posterior right upper lobe on the previous study has decreased in the interval, likely sequelae of atypical infection. No new suspicious pulmonary nodule or mass. No pleural effusion. Musculoskeletal: No worrisome lytic or sclerotic osseous abnormality. CT ABDOMEN PELVIS FINDINGS Hepatobiliary: No suspicious focal abnormality within the liver parenchyma. There is no evidence for gallstones, gallbladder wall thickening, or pericholecystic fluid. No intrahepatic or extrahepatic biliary dilation. Pancreas: The diffuse pancreatic parenchymal atrophy  with calcification and diffuse dilatation of the main duct is similar to prior. Multi lobular cystic lesion in the tail of pancreas measures 4.6 x 2.6 cm, stable when I remeasure in a similar fashion on the prior study. Spleen: No splenomegaly. No focal mass lesion. Adrenals/Urinary Tract: Stable bilateral adrenal nodules measuring up to 15 mm on the left. These nodules are not seen to be hypermetabolic on PET-CT of 36/14/4315. 8 mm nonobstructing stone noted lower pole right kidney. Tiny low-density lesions in both kidneys are too small to characterize but likely benign. No evidence for hydroureter. The urinary bladder appears normal for the degree of distention. Stomach/Bowel: Stomach is unremarkable. No gastric wall thickening. No evidence of outlet obstruction. Duodenum is normally positioned as is the ligament of Treitz. Large duodenal diverticulum noted. No small bowel wall thickening. No small bowel dilatation. The terminal ileum is normal. The appendix is normal. No gross colonic mass. No colonic wall thickening. Vascular/Lymphatic: There is abdominal aortic atherosclerosis without aneurysm. There is no gastrohepatic or hepatoduodenal ligament lymphadenopathy. No retroperitoneal or mesenteric lymphadenopathy. No pelvic sidewall lymphadenopathy. Reproductive: The uterus is unremarkable.  There is no adnexal mass. Other: No intraperitoneal free fluid. Musculoskeletal: No worrisome lytic or sclerotic osseous abnormality. IMPRESSION: 1. Continued further decrease in size of the left upper lobe pulmonary lesion. 2. Stable appearance of the central right upper lobe pulmonary nodule. 3. Interval decrease in tree-in-bud nodularity posterior right upper lobe, likely sequelae of atypical infection. 4. Stable bilateral adrenal nodules, not seen to be hypermetabolic on PET-CT of 40/09/6759. Continued attention on follow-up recommended. 5. No change in appearance of the lobulated cystic lesion in the tail of pancreas. 6.  Nonobstructing right renal stone. 7. Enlargement of the pulmonary outflow tract and main pulmonary arteries suggests pulmonary arterial hypertension. 8. Aortic Atherosclerosis (ICD10-I70.0) and Emphysema (ICD10-J43.9). Electronically Signed   By: Misty Stanley M.D.   On: 05/09/2019 10:48    No results found.  No results found.    Assessment and Plan: Patient Active Problem List   Diagnosis Date Noted   Chronic obstructive pulmonary disease ([place]) 09/03/2019   Supplemental oxygen dependent 09/03/2019   Bilateral hearing loss 03/09/2019   Generalized anxiety disorder 03/09/2019   Goals of care, counseling/discussion 04/18/2018   Mass of upper lobe of left lung 04/18/2018   Intermittent asthma without complication 95/10/3265   Chronic pain disorder 06/07/2017   Essential hypertension 06/07/2017   Mixed hyperlipidemia 02/03/2017   Hypothyroidism 02/03/2017   Type 2 diabetes mellitus with hyperglycemia ([place]) 02/03/2017   Dysuria 02/03/2017    1. Chronic obstructive pulmonary disease, unspecified COPD type ([place]) Well controlled, continue current medication management.   2. Supplemental oxygen dependent Continue to use oxygen 2-3 lpm as prescribed.   3. Intermittent asthma without complication,  unspecified asthma severity Stable, using albuterol intermittently. Symptoms well controlled.   General Counseling: I have discussed the findings of the evaluation and examination with Argelia.  I have also discussed any further diagnostic evaluation thatmay be needed or ordered today. Odyssey verbalizes understanding of the findings of todays visit. We also reviewed her medications today and discussed drug interactions and side effects including but not limited excessive drowsiness and altered mental states. We also discussed that there is always a risk not just to her but also people around her. she has been encouraged to call the office with any questions or concerns that should arise  related to todays visit.  No orders of the defined types were placed in this encounter.    Time spent: 25 This patient was seen by Orson Gear AGNP-C in Collaboration with Dr. Devona Konig as a part of collaborative care agreement.   I have personally obtained a history, examined the patient, evaluated laboratory and imaging results, formulated the assessment and plan and placed orders.    Allyne Gee, MD [facility] Pulmonary and Critical Care Sleep medicine

## 2019-11-01 ENCOUNTER — Telehealth: Payer: Self-pay

## 2019-11-01 ENCOUNTER — Other Ambulatory Visit: Payer: Self-pay

## 2019-11-01 MED ORDER — CIPROFLOXACIN HCL 500 MG PO TABS: 500.0000 mg | ORAL_TABLET | Freq: Two times a day (BID) | ORAL | 0 refills | Status: DC

## 2019-11-01 NOTE — Telephone Encounter (Signed)
Pt called that she having UTI symptoms burning and pain as per dr Humphrey Rolls send her cipro for 7 days

## 2019-11-06 DIAGNOSIS — J449 Chronic obstructive pulmonary disease, unspecified: Secondary | ICD-10-CM | POA: Diagnosis not present

## 2019-11-13 ENCOUNTER — Ambulatory Visit
Admission: RE | Admit: 2019-11-13 | Discharge: 2019-11-13 | Disposition: A | Discharge status: Home / Self Care | Payer: Medicare Other | Source: Ambulatory Visit | Attending: Oncology | Admitting: Oncology

## 2019-11-13 ENCOUNTER — Other Ambulatory Visit: Payer: Self-pay

## 2019-11-13 DIAGNOSIS — C3490 Malignant neoplasm of unspecified part of unspecified bronchus or lung: Secondary | ICD-10-CM | POA: Diagnosis not present

## 2019-11-13 DIAGNOSIS — I700 Atherosclerosis of aorta: Secondary | ICD-10-CM | POA: Diagnosis not present

## 2019-11-13 DIAGNOSIS — N200 Calculus of kidney: Secondary | ICD-10-CM | POA: Diagnosis not present

## 2019-11-13 DIAGNOSIS — R911 Solitary pulmonary nodule: Secondary | ICD-10-CM | POA: Diagnosis not present

## 2019-11-13 DIAGNOSIS — J439 Emphysema, unspecified: Secondary | ICD-10-CM | POA: Diagnosis not present

## 2019-11-13 LAB — POCT I-STAT CREATININE: Creatinine, Ser: 0.9 mg/dL (ref 0.44–1.00)

## 2019-11-13 MED ORDER — IOHEXOL 300 MG/ML  SOLN
100.0000 mL | Freq: Once | INTRAMUSCULAR | Status: AC | PRN
  Administered 2019-11-13: 100 mL via INTRAVENOUS

## 2019-11-15 ENCOUNTER — Other Ambulatory Visit: Payer: Self-pay | Admitting: *Deleted

## 2019-11-15 ENCOUNTER — Encounter: Payer: Self-pay | Admitting: Oncology

## 2019-11-15 ENCOUNTER — Other Ambulatory Visit: Payer: Self-pay

## 2019-11-15 ENCOUNTER — Inpatient Hospital Stay: Payer: Medicare Other | Attending: Oncology

## 2019-11-15 ENCOUNTER — Inpatient Hospital Stay (HOSPITAL_BASED_OUTPATIENT_CLINIC_OR_DEPARTMENT_OTHER): Payer: Medicare Other | Admitting: Oncology

## 2019-11-15 VITALS — BP 145/100 | HR 85 | Temp 98.0°F | Resp 20 | Wt 181.0 lb

## 2019-11-15 DIAGNOSIS — E119 Type 2 diabetes mellitus without complications: Secondary | ICD-10-CM | POA: Insufficient documentation

## 2019-11-15 DIAGNOSIS — K8689 Other specified diseases of pancreas: Secondary | ICD-10-CM

## 2019-11-15 DIAGNOSIS — C3412 Malignant neoplasm of upper lobe, left bronchus or lung: Secondary | ICD-10-CM | POA: Insufficient documentation

## 2019-11-15 DIAGNOSIS — Z79899 Other long term (current) drug therapy: Secondary | ICD-10-CM | POA: Insufficient documentation

## 2019-11-15 DIAGNOSIS — Z9981 Dependence on supplemental oxygen: Secondary | ICD-10-CM | POA: Insufficient documentation

## 2019-11-15 DIAGNOSIS — C3490 Malignant neoplasm of unspecified part of unspecified bronchus or lung: Secondary | ICD-10-CM

## 2019-11-15 DIAGNOSIS — Z7984 Long term (current) use of oral hypoglycemic drugs: Secondary | ICD-10-CM | POA: Diagnosis not present

## 2019-11-15 DIAGNOSIS — I110 Hypertensive heart disease with heart failure: Secondary | ICD-10-CM | POA: Diagnosis not present

## 2019-11-15 DIAGNOSIS — Z923 Personal history of irradiation: Secondary | ICD-10-CM | POA: Diagnosis not present

## 2019-11-15 DIAGNOSIS — Z85118 Personal history of other malignant neoplasm of bronchus and lung: Secondary | ICD-10-CM | POA: Diagnosis not present

## 2019-11-15 DIAGNOSIS — Z833 Family history of diabetes mellitus: Secondary | ICD-10-CM | POA: Insufficient documentation

## 2019-11-15 DIAGNOSIS — J449 Chronic obstructive pulmonary disease, unspecified: Secondary | ICD-10-CM | POA: Diagnosis not present

## 2019-11-15 DIAGNOSIS — Z08 Encounter for follow-up examination after completed treatment for malignant neoplasm: Secondary | ICD-10-CM

## 2019-11-15 DIAGNOSIS — Z803 Family history of malignant neoplasm of breast: Secondary | ICD-10-CM | POA: Diagnosis not present

## 2019-11-15 DIAGNOSIS — I509 Heart failure, unspecified: Secondary | ICD-10-CM | POA: Diagnosis not present

## 2019-11-15 DIAGNOSIS — Z800 Family history of malignant neoplasm of digestive organs: Secondary | ICD-10-CM | POA: Diagnosis not present

## 2019-11-15 DIAGNOSIS — K862 Cyst of pancreas: Secondary | ICD-10-CM

## 2019-11-15 DIAGNOSIS — Z801 Family history of malignant neoplasm of trachea, bronchus and lung: Secondary | ICD-10-CM | POA: Insufficient documentation

## 2019-11-15 DIAGNOSIS — Z87891 Personal history of nicotine dependence: Secondary | ICD-10-CM | POA: Insufficient documentation

## 2019-11-15 LAB — CBC WITH DIFFERENTIAL/PLATELET
Abs Immature Granulocytes: 0.02 10*3/uL (ref 0.00–0.07)
Basophils Absolute: 0.1 10*3/uL (ref 0.0–0.1)
Basophils Relative: 1 %
Eosinophils Absolute: 0.2 10*3/uL (ref 0.0–0.5)
Eosinophils Relative: 3 %
HCT: 35.2 % — ABNORMAL LOW (ref 36.0–46.0)
Hemoglobin: 11.4 g/dL — ABNORMAL LOW (ref 12.0–15.0)
Immature Granulocytes: 0 %
Lymphocytes Relative: 21 %
Lymphs Abs: 1.5 10*3/uL (ref 0.7–4.0)
MCH: 31.6 pg (ref 26.0–34.0)
MCHC: 32.4 g/dL (ref 30.0–36.0)
MCV: 97.5 fL (ref 80.0–100.0)
Monocytes Absolute: 0.5 10*3/uL (ref 0.1–1.0)
Monocytes Relative: 7 %
Neutro Abs: 5 10*3/uL (ref 1.7–7.7)
Neutrophils Relative %: 68 %
Platelets: 253 10*3/uL (ref 150–400)
RBC: 3.61 MIL/uL — ABNORMAL LOW (ref 3.87–5.11)
RDW: 13.3 % (ref 11.5–15.5)
WBC: 7.4 10*3/uL (ref 4.0–10.5)
nRBC: 0 % (ref 0.0–0.2)

## 2019-11-15 LAB — COMPREHENSIVE METABOLIC PANEL
ALT: 15 U/L (ref 0–44)
AST: 18 U/L (ref 15–41)
Albumin: 3.5 g/dL (ref 3.5–5.0)
Alkaline Phosphatase: 63 U/L (ref 38–126)
Anion gap: 8 (ref 5–15)
BUN: 20 mg/dL (ref 8–23)
CO2: 31 mmol/L (ref 22–32)
Calcium: 9.2 mg/dL (ref 8.9–10.3)
Chloride: 105 mmol/L (ref 98–111)
Creatinine, Ser: 0.84 mg/dL (ref 0.44–1.00)
GFR calc Af Amer: >60 mL/min (ref >60)
GFR calc non Af Amer: >60 mL/min (ref >60)
Glucose, Bld: 195 mg/dL — ABNORMAL HIGH (ref 70–99)
Potassium: 4.2 mmol/L (ref 3.5–5.1)
Sodium: 144 mmol/L (ref 135–145)
Total Bilirubin: 0.5 mg/dL (ref 0.3–1.2)
Total Protein: 6.8 g/dL (ref 6.5–8.1)

## 2019-11-15 NOTE — Progress Notes (Signed)
Patient states she gets dizzy when looking back.  Patient states it burn when she pees.

## 2019-11-17 NOTE — Progress Notes (Signed)
Hematology/Oncology Consult note [facility]  Telephone:([phone] [phone]  Patient Care Team: [name], MD as PCP - General (Internal Medicine) Telford Nab, RN as Registered Nurse   Name of the patient: Lynn Robbins  [id number]  10-23-1940   Date of visit: 11/17/19  Diagnosis- clinical stage I lung cancer left upper lobe cT2 N0 M0  Chief complaint/ Reason for visit-routine follow-up of lung cancer  Heme/Onc history: patient is a 79 year old female with a past medical history significant for COPD for which she is on continuous oxygen 2 L, hypertension, CHF who presented to the ER on 04/10/2018 with some symptoms of chest wall pain which led toCT chest and CT abdomen. CT scan showed left upper lobe spiculated mass highly concerning for primary lung cancer. There was also a second nodule in the right upper lobe which may represent synchronous tumor. No significant mediastinal adenopathy. This was followed by a PET CT scan on 04/14/2018 which showed hypermetabolic left upper lobe lung mass 3.5 x 2.3 cm with an SUV of 13.5. Second nodule in the right upper lobe measuring 14 mm which did not show any hypermetabolism. No hypermetabolic mediastinal lymph nodes.  Patient received SBRT to that the lesion  Interval history-patient has chronic oxygen dependent COPD which is essentially stable.  She has not had any hospitalizations in the last 6 months.  Reports chronic fatigue and back pain.  She has exertional shortness of breath at baseline  ECOG PS- 2 Pain scale- 3   Review of systems- Review of Systems  Constitutional: Positive for malaise/fatigue. Negative for chills, fever and weight loss.  HENT: Negative for congestion, ear discharge and nosebleeds.   Eyes: Negative for blurred vision.  Respiratory: Positive for shortness of breath. Negative for cough, hemoptysis, sputum production and wheezing.   Cardiovascular: Negative for chest  pain, palpitations, orthopnea and claudication.  Gastrointestinal: Negative for abdominal pain, blood in stool, constipation, diarrhea, heartburn, melena, nausea and vomiting.  Genitourinary: Negative for dysuria, flank pain, frequency, hematuria and urgency.  Musculoskeletal: Negative for back pain, joint pain and myalgias.  Skin: Negative for rash.  Neurological: Negative for dizziness, tingling, focal weakness, seizures, weakness and headaches.  Endo/Heme/Allergies: Does not bruise/bleed easily.  Psychiatric/Behavioral: Negative for depression and suicidal ideas. The patient does not have insomnia.       No Known Allergies   Past Medical History:  Diagnosis Date  . Cancer ([place])   . CHF (congestive heart failure) ([place])   . COPD (chronic obstructive pulmonary disease) ([place])   . Depression   . Diabetes mellitus without complication ([place])   . HOH (hard of hearing)   . Hypertension      Past Surgical History:  Procedure Laterality Date  . CESAREAN SECTION      Social History   Socioeconomic History  . Marital status: Single    Spouse name: Not on file  . Number of children: 5  . Years of education: Not on file  . Highest education level: Not on file  Occupational History  . Not on file  Tobacco Use  . Smoking status: Former Smoker    Packs/day: 3.00    Years: 57.00    Pack years: 171.00    Types: Cigarettes    Quit date: 04/17/1998    Years since quitting: 21.6  . Smokeless tobacco: Former Systems developer    Types: Snuff  Vaping Use  . Vaping Use: Never used  Substance and Sexual Activity  . Alcohol  use: No  . Drug use: No  . Sexual activity: Never  Other Topics Concern  . Not on file  Social History Narrative   ** Merged History Encounter **       Social Determinants of Health   Financial Resource Strain:   . Difficulty of Paying Living Expenses: Not on file  Food Insecurity:   . Worried About Charity fundraiser in the Last Year: Not on file  . Ran Out of Food  in the Last Year: Not on file  Transportation Needs:   . Lack of Transportation (Medical): Not on file  . Lack of Transportation (Non-Medical): Not on file  Physical Activity:   . Days of Exercise per Week: Not on file  . Minutes of Exercise per Session: Not on file  Stress:   . Feeling of Stress : Not on file  Social Connections:   . Frequency of Communication with Friends and Family: Not on file  . Frequency of Social Gatherings with Friends and Family: Not on file  . Attends Religious Services: Not on file  . Active Member of Clubs or Organizations: Not on file  . Attends Archivist Meetings: Not on file  . Marital Status: Not on file  Intimate Partner Violence:   . Fear of Current or Ex-Partner: Not on file  . Emotionally Abused: Not on file  . Physically Abused: Not on file  . Sexually Abused: Not on file    Family History  Problem Relation Age of Onset  . Diabetes Father   . Colon cancer Father   . Colon cancer Mother   . Aneurysm Mother 74       brain  . Lung cancer Sister   . Breast cancer Sister   . Lung cancer Brother   . Lung cancer Sister   . Bone cancer Brother   . Lung cancer Brother   . Cancer Sister        type unknown     Current Outpatient Medications:  .  Accu-Chek FastClix Lancets MISC, Use as directed to check sugars. DX E11.65, Disp: 306 each, Rfl: 3 .  albuterol (PROAIR HFA) 108 (90 Base) MCG/ACT inhaler, TAKE 2 PUFFS BY MOUTH EVERY 4 HOURS AS NEEDED, Disp: 54 g, Rfl: 4 .  atenolol (TENORMIN) 25 MG tablet, Take 1 tablet (25 mg total) by mouth daily., Disp: 90 tablet, Rfl: 3 .  ciprofloxacin (CIPRO) 500 MG tablet, Take 1 tablet (500 mg total) by mouth 2 (two) times daily., Disp: 14 tablet, Rfl: 0 .  clonazePAM (KLONOPIN) 1 MG tablet, TAKE ONE TAB BY MOUTH TWICE DAILY FOR ANXIETY, Disp: 180 tablet, Rfl: 0 .  glimepiride (AMARYL) 2 MG tablet, Take 1 tablet (2 mg total) by mouth daily with breakfast., Disp: 90 tablet, Rfl: 3 .   HYDROcodone-acetaminophen (NORCO/VICODIN) 5-325 MG tablet, TAKE 1 TABLET BY MOUTH TWICE DAILY AS NEEDED FOR BACK PAIN, Disp: 60 tablet, Rfl: 0 .  losartan-hydrochlorothiazide (HYZAAR) 50-12.5 MG tablet, TAKE 1 TABLET BY MOUTH DAILY, Disp: 90 tablet, Rfl: 3 .  OXYGEN, Inhale into the lungs., Disp: , Rfl:  .  pantoprazole (PROTONIX) 40 MG tablet, Take 1 tablet (40 mg total) by mouth daily., Disp: 90 tablet, Rfl: 3 .  SitaGLIPtin-MetFORMIN HCl (JANUMET XR) 50-500 MG TB24, TAKE ONE TABLET EVERY DAY WITH FOOD FOR DIABETES, Disp: 90 tablet, Rfl: 3 .  tiotropium (SPIRIVA HANDIHALER) 18 MCG inhalation capsule, INHALE 1 CAPSULE VIA HANDIHALER ONCE DAILY AT THE SAME TIME EVERY DAY,  Disp: 90 capsule, Rfl: 3  Physical exam:  Vitals:   11/15/19 1147 11/15/19 2250  BP: (!) 145/100 (S) (!) 145/100  Pulse: 85   Resp: 20   Temp: 98 F (36.7 C)   SpO2: 100%   Weight: 181 lb (82.1 kg)    Physical Exam Constitutional:      General: She is not in acute distress.    Comments: Sitting in a wheelchair  Cardiovascular:     Rate and Rhythm: Normal rate and regular rhythm.     Heart sounds: Normal heart sounds.  Pulmonary:     Comments: Effort increased.  Breath sounds decreased over the lung bases.  She is on oxygen. Abdominal:     General: Bowel sounds are normal.     Palpations: Abdomen is soft.  Skin:    General: Skin is warm and dry.  Neurological:     Mental Status: She is alert and oriented to person, place, and time.      CMP Latest Ref Rng & Units 11/15/2019  Glucose 70 - 99 mg/dL 195(H)  BUN 8 - 23 mg/dL 20  Creatinine 0.44 - 1.00 mg/dL 0.84  Sodium 135 - 145 mmol/L 144  Potassium 3.5 - 5.1 mmol/L 4.2  Chloride 98 - 111 mmol/L 105  CO2 22 - 32 mmol/L 31  Calcium 8.9 - 10.3 mg/dL 9.2  Total Protein 6.5 - 8.1 g/dL 6.8  Total Bilirubin 0.3 - 1.2 mg/dL 0.5  Alkaline Phos 38 - 126 U/L 63  AST 15 - 41 U/L 18  ALT 0 - 44 U/L 15   CBC Latest Ref Rng & Units 11/15/2019  WBC 4.0 - 10.5 K/uL  7.4  Hemoglobin 12.0 - 15.0 g/dL 11.4(L)  Hematocrit 36 - 46 % 35.2(L)  Platelets 150 - 400 K/uL 253    No images are attached to the encounter.  CT Chest W Contrast  Result Date: 11/13/2019 CLINICAL DATA:  Follow-up lung carcinoma.  Restaging. EXAM: CT CHEST, ABDOMEN, AND PELVIS WITH CONTRAST TECHNIQUE: Multidetector CT imaging of the chest, abdomen and pelvis was performed following the standard protocol during bolus administration of intravenous contrast. CONTRAST:  171mL OMNIPAQUE IOHEXOL 300 MG/ML  SOLN COMPARISON:  05/08/2019 FINDINGS: CT CHEST FINDINGS Cardiovascular: No acute findings. Aortic atherosclerosis noted. Mediastinum/Lymph Nodes: No masses or pathologically enlarged lymph nodes identified. Lungs/Pleura: Pulmonary nodule in the peripheral left upper lobe measures 2.5 x 1.3 cm on image 26/4, without significant change compared to previous study. Adjacent left pleural thickening remains stable and there is no evidence of pleural effusion. A 1.4 x 0.6 cm nodule in the anterior right upper lobe on image 45/4 is also unchanged. Previously seen tree-in-bud nodular opacities in the right upper lung have resolved. No new or enlarging pulmonary nodules or masses are identified. Mild-to-moderate emphysema again noted. Musculoskeletal:  No suspicious bone lesions identified. CT ABDOMEN AND PELVIS FINDINGS Hepatobiliary: No masses identified. Gallbladder is unremarkable. No evidence of biliary ductal dilatation. Pancreas: Diffuse pancreatic atrophy is again seen. Multilobulated cystic lesion in the pancreatic tail is again seen measuring up to 4.6 x 3.3 cm on image 53/2. This is mildly increased in size compared to 4.1 x 3.1 cm on earlier exam of 04/10/2018. In addition, there is marked diffuse dilatation of the main pancreatic duct to the level the pancreatic head with scattered calcifications. The main pancreatic duct measures 2.6 cm in diameter in the pancreatic head, and a possible tiny enhancing  soft tissue nodule is seen on image 59/2, without  significant change since previous study. 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 Spleen:  Within normal limits in size and appearance. Adrenals/Urinary tract: Stable small bilateral adrenal nodules, most consistent with benign adenomas. 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 No evidence of ureteral calculi or hydronephrosis. A few tiny bilateral renal cysts are again seen, however there is no evidence of renal mass. Unremarkable unopacified urinary bladder. Stomach/Bowel: No evidence of obstruction, inflammatory process, or abnormal fluid collections. Diverticulosis is seen mainly involving the sigmoid colon, however there is no evidence of diverticulitis. Vascular/Lymphatic: No pathologically enlarged lymph nodes identified. No abdominal aortic aneurysm. Aortic atherosclerosis noted. Reproductive:  No mass or other significant abnormality identified. Other:  None. Musculoskeletal:  No suspicious bone lesions identified. IMPRESSION: Stable left upper lobe pulmonary nodule. Stable appearance of small right upper lobe pulmonary nodule. No evidence of metastatic lung carcinoma. Mild increase in size of 4.6 cm cystic lesion in pancreatic tail compared to prior studies, with marked diffuse dilatation of the main pancreatic duct and possible tiny intraductal soft tissue nodule. 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 Recommend gastroenterology consultation and consideration of EUS/FNA. 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 No evidence of ureteral calculi or hydronephrosis. Stable small bilateral adrenal nodules, most consistent with benign adenomas. Colonic diverticulosis, without radiographic evidence of diverticulitis. Aortic  Atherosclerosis (ICD10-I70.0) and Emphysema (ICD10-J43.9). Electronically Signed   By: Marlaine Hind M.D.   On: 11/13/2019 18:48   CT Abdomen Pelvis W Contrast  Result Date: 11/13/2019 CLINICAL DATA:  Follow-up lung carcinoma.  Restaging. EXAM: CT CHEST, ABDOMEN, AND PELVIS WITH CONTRAST TECHNIQUE: Multidetector CT imaging of the chest, abdomen and pelvis was performed following the standard protocol during bolus administration of intravenous contrast. CONTRAST:  164mL OMNIPAQUE IOHEXOL 300 MG/ML  SOLN COMPARISON:  05/08/2019 FINDINGS: CT CHEST FINDINGS Cardiovascular: No acute findings. Aortic atherosclerosis noted. Mediastinum/Lymph Nodes: No masses or pathologically enlarged lymph nodes identified. Lungs/Pleura: Pulmonary nodule in the peripheral left upper lobe measures 2.5 x 1.3 cm on image 26/4, without significant change compared to previous study. Adjacent left pleural thickening remains stable and there is no evidence of pleural effusion. A 1.4 x 0.6 cm nodule in the anterior right upper lobe on image 45/4 is also unchanged. Previously seen tree-in-bud nodular opacities in the right upper lung have resolved. No new or enlarging pulmonary nodules or masses are identified. Mild-to-moderate emphysema again noted. Musculoskeletal:  No suspicious bone lesions identified. CT ABDOMEN AND PELVIS FINDINGS Hepatobiliary: No masses identified. Gallbladder is unremarkable. No evidence of biliary ductal dilatation. Pancreas: Diffuse pancreatic atrophy is again seen. Multilobulated cystic lesion in the pancreatic tail is again seen measuring up to 4.6 x 3.3 cm on image 53/2. This is mildly increased in size compared to 4.1 x 3.1 cm on earlier exam of 04/10/2018. In addition, there is marked diffuse dilatation of the main pancreatic duct to the level the pancreatic head with scattered calcifications. The main pancreatic duct measures 2.6 cm in diameter in the pancreatic head, and a possible tiny enhancing soft tissue  nodule is seen on image 59/2, without significant change since previous study. 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 Spleen:  Within normal limits in size and appearance. Adrenals/Urinary tract: Stable small bilateral adrenal nodules, most consistent with benign adenomas. Increased  size of partial staghorn calculus in the renal pelvis and lower pole collecting system of the right kidney. No evidence of ureteral calculi or hydronephrosis. A few tiny bilateral renal cysts are again seen, however there is no evidence of renal mass. Unremarkable unopacified urinary bladder. Stomach/Bowel: No evidence of obstruction, inflammatory process, or abnormal fluid collections. Diverticulosis is seen mainly involving the sigmoid colon, however there is no evidence of diverticulitis. Vascular/Lymphatic: No pathologically enlarged lymph nodes identified. No abdominal aortic aneurysm. Aortic atherosclerosis noted. Reproductive:  No mass or other significant abnormality identified. Other:  None. Musculoskeletal:  No suspicious bone lesions identified. IMPRESSION: Stable left upper lobe pulmonary nodule. Stable appearance of small right upper lobe pulmonary nodule. No evidence of metastatic lung carcinoma. Mild increase in size of 4.6 cm cystic lesion in pancreatic tail compared to prior studies, with marked diffuse dilatation of the main pancreatic duct and possible tiny intraductal soft tissue nodule. 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 Recommend gastroenterology consultation and consideration of EUS/FNA. 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 No evidence of ureteral calculi or hydronephrosis. Stable small bilateral adrenal nodules, most consistent with benign adenomas. Colonic diverticulosis, without radiographic evidence of diverticulitis. Aortic Atherosclerosis  (ICD10-I70.0) and Emphysema (ICD10-J43.9). Electronically Signed   By: Marlaine Hind M.D.   On: 11/13/2019 18:48     Assessment and plan- Patient is a 79 y.o. female with history of stage I left upper lobe lung cancer T2 N0 M0 s/p SBRT here to discuss CT scan results  I reviewed CT chest abdomen pelvis images independently and discussed findings with the patient.  The previously radiated left upper lobe lung nodule has remained stable in size without any evidence of progression.  She also has a right upper lobe lung nodule which appears unchanged.  There are no new nodules presently identified.  We will continue to monitor this with a repeat CT chest in 6 months.    CT abdomen and pelvis with contrast which was done in September was compared to a prior scan 6 months ago.  It again shows a cystic lesion in the pancreatic tail which is 4.6 x 3.3 cm and appears mildly increased as compared to her prior exam.  There is now marked diffuse dilatation of the main pancreatic duct to the level of pancreatic head with calcifications.  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  Ideally this needs to be evaluated with an EUS.  I will refer her to advanced gastroenterology Dr. Ardis Hughs or Dr. Rush Landmark at [place].    However given patient's baseline performance status and oxygen dependent COPD it remains to be seen if she would be a candidate for EUS versus active surveillance.  Also even if the diagnosis of IPMN is made on EUS she would not be a candidate for Whipple surgery.  I have informed Dr. Humphrey Rolls about this as well.  I will see her back in 6 months with a CT chest prior   Visit Diagnosis 1. Encounter for follow-up surveillance of lung cancer   2. Pancreatic mass      Dr. Randa Evens, MD, MPH [facility] [phone] 11/17/2019 10:16 AM

## 2019-12-03 ENCOUNTER — Other Ambulatory Visit: Payer: Self-pay

## 2019-12-03 ENCOUNTER — Telehealth: Payer: Self-pay

## 2019-12-03 ENCOUNTER — Other Ambulatory Visit: Payer: Self-pay | Admitting: Nurse Practitioner

## 2019-12-03 DIAGNOSIS — G894 Chronic pain syndrome: Secondary | ICD-10-CM

## 2019-12-03 DIAGNOSIS — E1165 Type 2 diabetes mellitus with hyperglycemia: Secondary | ICD-10-CM

## 2019-12-03 MED ORDER — HYDROCODONE-ACETAMINOPHEN 5-325 MG PO TABS: ORAL_TABLET | ORAL | 0 refills | Status: DC

## 2019-12-03 MED ORDER — GLIMEPIRIDE 2 MG PO TABS: 2.0000 mg | ORAL_TABLET | Freq: Every day | ORAL | 3 refills | Status: DC

## 2019-12-03 NOTE — Telephone Encounter (Signed)
Pt.notified

## 2019-12-03 NOTE — Telephone Encounter (Signed)
As per dfk its ok to send

## 2019-12-03 NOTE — Telephone Encounter (Signed)
What do you think Dr. Humphrey Rolls thinks about her getting single thirty day prescription until her next visit?

## 2019-12-03 NOTE — Telephone Encounter (Signed)
Approved single 30 day prescription for hydrocodone and sent to [facility].

## 2019-12-03 NOTE — Telephone Encounter (Signed)
Can you check this pt is ok to send her hydrocodone due to pt don't have ride and her nest appt 11/30

## 2019-12-03 NOTE — Progress Notes (Signed)
Approved single 30 day prescription for hydrocodone and sent to [facility].

## 2019-12-06 DIAGNOSIS — J449 Chronic obstructive pulmonary disease, unspecified: Secondary | ICD-10-CM | POA: Diagnosis not present

## 2019-12-19 ENCOUNTER — Other Ambulatory Visit: Payer: Self-pay | Admitting: *Deleted

## 2019-12-19 DIAGNOSIS — K862 Cyst of pancreas: Secondary | ICD-10-CM

## 2020-01-02 ENCOUNTER — Other Ambulatory Visit: Payer: Self-pay | Admitting: Nurse Practitioner

## 2020-01-02 DIAGNOSIS — G894 Chronic pain syndrome: Secondary | ICD-10-CM

## 2020-01-02 DIAGNOSIS — F411 Generalized anxiety disorder: Secondary | ICD-10-CM

## 2020-01-02 MED ORDER — HYDROCODONE-ACETAMINOPHEN 5-325 MG PO TABS: ORAL_TABLET | ORAL | 0 refills | Status: DC

## 2020-01-02 MED ORDER — CLONAZEPAM 1 MG PO TABS: ORAL_TABLET | ORAL | 0 refills | Status: DC

## 2020-01-02 NOTE — Progress Notes (Signed)
Sent new prescriptions for clonazepam and hydrocodone, both prescribed at [facility] prn. Sent #30 tablts of both, to last until CPE scheduled 01/15/2020

## 2020-01-03 ENCOUNTER — Other Ambulatory Visit: Payer: Self-pay

## 2020-01-03 DIAGNOSIS — I10 Essential (primary) hypertension: Secondary | ICD-10-CM

## 2020-01-03 MED ORDER — ATENOLOL 25 MG PO TABS: 25.0000 mg | ORAL_TABLET | Freq: Every day | ORAL | 3 refills | Status: DC

## 2020-01-06 DIAGNOSIS — J449 Chronic obstructive pulmonary disease, unspecified: Secondary | ICD-10-CM | POA: Diagnosis not present

## 2020-01-07 ENCOUNTER — Ambulatory Visit (INDEPENDENT_AMBULATORY_CARE_PROVIDER_SITE_OTHER): Payer: Medicare Other | Admitting: Gastroenterology

## 2020-01-07 ENCOUNTER — Telehealth: Payer: Self-pay | Admitting: Gastroenterology

## 2020-01-07 DIAGNOSIS — Z5329 Procedure and treatment not carried out because of patient's decision for other reasons: Secondary | ICD-10-CM

## 2020-01-07 NOTE — Progress Notes (Signed)
No show

## 2020-01-15 ENCOUNTER — Ambulatory Visit: Payer: Medicare Other | Admitting: Internal Medicine

## 2020-01-17 ENCOUNTER — Ambulatory Visit (INDEPENDENT_AMBULATORY_CARE_PROVIDER_SITE_OTHER): Payer: Medicare Other | Admitting: Internal Medicine

## 2020-01-17 ENCOUNTER — Encounter: Payer: Self-pay | Admitting: Internal Medicine

## 2020-01-17 ENCOUNTER — Other Ambulatory Visit: Payer: Self-pay

## 2020-01-17 VITALS — BP 122/60 | HR 91 | Temp 98.0°F | Resp 16 | Ht 62.0 in | Wt 178.2 lb

## 2020-01-17 DIAGNOSIS — R300 Dysuria: Secondary | ICD-10-CM | POA: Diagnosis not present

## 2020-01-17 DIAGNOSIS — Z23 Encounter for immunization: Secondary | ICD-10-CM

## 2020-01-17 DIAGNOSIS — Z5320 Procedure and treatment not carried out because of patient's decision for unspecified reasons: Secondary | ICD-10-CM

## 2020-01-17 DIAGNOSIS — K8689 Other specified diseases of pancreas: Secondary | ICD-10-CM

## 2020-01-17 DIAGNOSIS — C3412 Malignant neoplasm of upper lobe, left bronchus or lung: Secondary | ICD-10-CM | POA: Diagnosis not present

## 2020-01-17 DIAGNOSIS — E1165 Type 2 diabetes mellitus with hyperglycemia: Secondary | ICD-10-CM

## 2020-01-17 DIAGNOSIS — M6282 Rhabdomyolysis: Secondary | ICD-10-CM | POA: Diagnosis not present

## 2020-01-17 DIAGNOSIS — F411 Generalized anxiety disorder: Secondary | ICD-10-CM

## 2020-01-17 DIAGNOSIS — Z9981 Dependence on supplemental oxygen: Secondary | ICD-10-CM

## 2020-01-17 DIAGNOSIS — T466X5A Adverse effect of antihyperlipidemic and antiarteriosclerotic drugs, initial encounter: Secondary | ICD-10-CM

## 2020-01-17 DIAGNOSIS — G894 Chronic pain syndrome: Secondary | ICD-10-CM | POA: Diagnosis not present

## 2020-01-17 DIAGNOSIS — Z0001 Encounter for general adult medical examination with abnormal findings: Secondary | ICD-10-CM

## 2020-01-17 LAB — POCT GLYCOSYLATED HEMOGLOBIN (HGB A1C): Hemoglobin A1C: 5.7 % — AB (ref 4.0–5.6)

## 2020-01-17 MED ORDER — CLONAZEPAM 1 MG PO TABS: ORAL_TABLET | ORAL | 3 refills | Status: DC

## 2020-01-17 MED ORDER — HYDROCODONE-ACETAMINOPHEN 5-325 MG PO TABS: ORAL_TABLET | ORAL | 0 refills | Status: DC

## 2020-01-17 NOTE — Progress Notes (Signed)
[facility] [place], [place] [address]  Internal MEDICINE  Office Visit Note  Patient Name: Lynn Robbins  [id number]  [id number]  Date of Service: 01/17/2020  Chief Complaint  Patient presents with  . Medicare Wellness  . Quality Metric Gaps    dexa scan, Tdap, flu vacc, foot exam, eye exam     HPI Pt is here for routine health maintenance examination. She is feeling at her baseline , New symptoms of diarrhea. Good diabetic control. 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  It again shows a cystic lesion in the pancreatic tail which is 4.6 x 3.3 cm and appears mildly increased as compared to her prior exam.  There is now marked diffuse dilatation of the main pancreatic duct to the level of pancreatic head with calcifications.  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  Ideally this needs to be evaluated with an EUS. Pt has appointment with GI for further evaluation   Current Medication: Outpatient Encounter Medications as of 01/17/2020  Medication Sig  . Accu-Chek FastClix Lancets MISC Use as directed to check sugars. DX E11.65  . albuterol (PROAIR HFA) 108 (90 Base) MCG/ACT inhaler TAKE 2 PUFFS BY MOUTH EVERY 4 HOURS AS NEEDED  . atenolol (TENORMIN) 25 MG tablet Take 1 tablet (25 mg total) by mouth daily.  . ciprofloxacin (CIPRO) 500 MG tablet Take 1 tablet (500 mg total) by mouth 2 (two) times daily.  . clonazePAM (KLONOPIN) 1 MG tablet TAKE ONE TAB BY MOUTH TWICE DAILY FOR ANXIETY  . glimepiride (AMARYL) 2 MG tablet Take 1 tablet (2 mg total) by mouth daily with breakfast.  . HYDROcodone-acetaminophen  (NORCO/VICODIN) 5-325 MG tablet TAKE 1 TABLET BY MOUTH TWICE DAILY AS NEEDED FOR BACK PAIN  . losartan-hydrochlorothiazide (HYZAAR) 50-12.5 MG tablet TAKE 1 TABLET BY MOUTH DAILY  . OXYGEN Inhale into the lungs.  . pantoprazole (PROTONIX) 40 MG tablet Take 1 tablet (40 mg total) by mouth daily.  . SitaGLIPtin-MetFORMIN HCl (JANUMET XR) 50-500 MG TB24 TAKE ONE TABLET EVERY DAY WITH FOOD FOR DIABETES  . tiotropium (SPIRIVA HANDIHALER) 18 MCG inhalation capsule INHALE 1 CAPSULE VIA HANDIHALER ONCE DAILY AT THE SAME TIME EVERY DAY  . [DISCONTINUED] clonazePAM (KLONOPIN) 1 MG tablet TAKE ONE TAB BY MOUTH TWICE DAILY FOR ANXIETY  . [DISCONTINUED] HYDROcodone-acetaminophen (NORCO/VICODIN) 5-325 MG tablet TAKE 1 TABLET BY MOUTH TWICE DAILY AS NEEDED FOR BACK PAIN   No facility-administered encounter medications on file as of 01/17/2020.    Surgical History: Past Surgical History:  Procedure Laterality Date  . CESAREAN SECTION      Medical History: Past Medical History:  Diagnosis Date  . Cancer ([place])   . CHF (congestive heart failure) ([place])   . COPD (chronic obstructive pulmonary disease) ([place])   . Depression   . Diabetes mellitus without complication ([place])   . HOH (hard of hearing)   . Hypertension     Family History: Family History  Problem Relation Age of Onset  . Diabetes Father   . Colon cancer Father   . Colon cancer Mother   . Aneurysm  Mother 45       brain  . Lung cancer Sister   . Breast cancer Sister   . Lung cancer Brother   . Lung cancer Sister   . Bone cancer Brother   . Lung cancer Brother   . Cancer Sister        type unknown      Review of Systems  Constitutional: Negative for chills, diaphoresis and fatigue.  HENT: Negative for ear pain, postnasal drip and sinus pressure.   Eyes: Negative for photophobia, discharge, redness, itching and visual disturbance.  Respiratory: Negative for cough, shortness of breath and wheezing.   Cardiovascular: Negative for  chest pain, palpitations and leg swelling.  Gastrointestinal: Positive for diarrhea. Negative for abdominal pain, constipation, nausea and vomiting.  Genitourinary: Negative for dysuria and flank pain.  Musculoskeletal: Positive for back pain. Negative for arthralgias, gait problem and neck pain.  Skin: Negative for color change.  Allergic/Immunologic: Negative for environmental allergies and food allergies.  Neurological: Negative for dizziness and headaches.  Hematological: Does not bruise/bleed easily.  Psychiatric/Behavioral: Negative for agitation, behavioral problems (depression) and hallucinations. The patient is nervous/anxious.      Vital Signs: BP 122/60   Pulse 91   Temp 98 F (36.7 C)   Resp 16   Ht 5\' 2"  (1.575 m)   Wt 178 lb 3.2 oz (80.8 kg)   SpO2 97%   BMI 32.59 kg/m    Physical Exam Constitutional:      General: She is not in acute distress.    Appearance: She is well-developed. She is not diaphoretic.  HENT:     Head: Normocephalic and atraumatic.     Mouth/Throat:     Pharynx: No oropharyngeal exudate.  Eyes:     Pupils: Pupils are equal, round, and reactive to light.  Neck:     Thyroid: No thyromegaly.     Vascular: No JVD.     Trachea: No tracheal deviation.  Cardiovascular:     Rate and Rhythm: Normal rate and regular rhythm.     Heart sounds: Normal heart sounds. No murmur heard.  No friction rub. No gallop.   Pulmonary:     Effort: Pulmonary effort is normal. No respiratory distress.     Breath sounds: No wheezing or rales.  Chest:     Chest wall: No tenderness.  Abdominal:     General: Bowel sounds are normal.     Palpations: Abdomen is soft.  Musculoskeletal:        General: Normal range of motion.     Cervical back: Normal range of motion and neck supple.  Lymphadenopathy:     Cervical: No cervical adenopathy.  Skin:    General: Skin is warm and dry.  Neurological:     Mental Status: She is alert and oriented to person, place, and  time.     Cranial Nerves: No cranial nerve deficit.  Psychiatric:        Behavior: Behavior normal.        Thought Content: Thought content normal.        Judgment: Judgment normal.      LABS: Recent Results (from the past 2160 hour(s))  I-STAT creatinine     Status: None   Collection Time: 11/13/19 10:01 AM  Result Value Ref Range   Creatinine, Ser 0.90 0.44 - 1.00 mg/dL  Comprehensive metabolic panel     Status: Abnormal   Collection Time: 11/15/19 11:43 AM  Result Value Ref Range  Sodium 144 135 - 145 mmol/L   Potassium 4.2 3.5 - 5.1 mmol/L   Chloride 105 98 - 111 mmol/L   CO2 31 22 - 32 mmol/L   Glucose, Bld 195 (H) 70 - 99 mg/dL    Comment: Glucose reference range applies only to samples taken after fasting for at least 8 hours.   BUN 20 8 - 23 mg/dL   Creatinine, Ser 0.84 0.44 - 1.00 mg/dL   Calcium 9.2 8.9 - 10.3 mg/dL   Total Protein 6.8 6.5 - 8.1 g/dL   Albumin 3.5 3.5 - 5.0 g/dL   AST 18 15 - 41 U/L   ALT 15 0 - 44 U/L   Alkaline Phosphatase 63 38 - 126 U/L   Total Bilirubin 0.5 0.3 - 1.2 mg/dL   GFR calc non Af Amer >60 >60 mL/min   GFR calc Af Amer >60 >60 mL/min   Anion gap 8 5 - 15    Comment: Performed at [facility], [address] [place], [place] [address]  CBC with Differential/Platelet     Status: Abnormal   Collection Time: 11/15/19 11:43 AM  Result Value Ref Range   WBC 7.4 4.0 - 10.5 K/uL   RBC 3.61 (L) 3.87 - 5.11 MIL/uL   Hemoglobin 11.4 (L) 12.0 - 15.0 g/dL   HCT 35.2 (L) 36 - 46 %   MCV 97.5 80.0 - 100.0 fL   MCH 31.6 26.0 - 34.0 pg   MCHC 32.4 30.0 - 36.0 g/dL   RDW 13.3 11.5 - 15.5 %   Platelets 253 150 - 400 K/uL   nRBC 0.0 0.0 - 0.2 %   Neutrophils Relative % 68 %   Neutro Abs 5.0 1.7 - 7.7 K/uL   Lymphocytes Relative 21 %   Lymphs Abs 1.5 0.7 - 4.0 K/uL   Monocytes Relative 7 %   Monocytes Absolute 0.5 0.1 - 1.0 K/uL   Eosinophils Relative 3 %   Eosinophils Absolute 0.2 0.0 - 0.5 K/uL   Basophils Relative 1 %    Basophils Absolute 0.1 0.0 - 0.1 K/uL   Immature Granulocytes 0 %   Abs Immature Granulocytes 0.02 0.00 - 0.07 K/uL    Comment: Performed at [facility], [address] [place], [place] [address]     Assessment/Plan: 1. Encounter for general adult medical examination with abnormal findings Pt is updated as follows for Preventive Health Maintenance (PHM) AWV  Mammogram  Colonoscopy  BMD  Pt has declined all. Cannot get out of the house    2. Type 2 diabetes mellitus with hyperglycemia, without long-term current use of insulin (HCC) Hg is below 6.0. reduced appetite as well, decrease Amaryl to 1 mg po qd  - POCT glycosylated hemoglobin (Hb A1C) - Microscopic Examination - Urine Culture, Reflex  3. 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 Chronic pain disorder Pain is under moderate control, will continue  - HYDROcodone-acetaminophen (NORCO/VICODIN) 5-325 MG tablet; TAKE 1 TABLET BY MOUTH TWICE DAILY AS NEEDED FOR BACK PAIN  Dispense: 60 tablet; Refill: 0  6. Generalized anxiety disorder GAD is unde rmoderate control, Her son has been living with her  - clonazePAM (KLONOPIN) 1 MG tablet; TAKE ONE TAB BY MOUTH TWICE DAILY FOR ANXIETY  Dispense: 60 tablet; Refill: 3  7. Screening mammography declined Pt has declined   8. Dysuria Micro albumin  ordered  - UA/M w/rflx Culture, Routine  9. Flu vaccine need - Flu Vaccine MDCK QUAD PF  10. Pancreatic mass -,Being followed by GI for possible EUS, Likely cause of diarrhea at this point   11. O2 dependent Continue on home O2 2 L [place] as before at all times   General Counseling: kendell gammon understanding of the findings of todays visit and agrees with plan of treatment. I have discussed any further diagnostic evaluation that may be needed or ordered today. We also reviewed her medications today. she  has been encouraged to call the office with any questions or concerns that should arise related to todays visit.  Counseling: Emotional support is provided. She is not felling depressed but feels disappointed at some of her kids. She is a long term patient of mine and is very dear to me. Her daughter does not speak to her for the last 6 years. Son is living in her trailer home with her, has been helping her with her appointments.   Orders Placed This Encounter  Procedures  . Flu Vaccine MDCK QUAD PF  . UA/M w/rflx Culture, Routine    Meds ordered this encounter  Medications  . HYDROcodone-acetaminophen (NORCO/VICODIN) 5-325 MG tablet    Sig: TAKE 1 TABLET BY MOUTH TWICE DAILY AS NEEDED FOR BACK PAIN    Dispense:  60 tablet    Refill:  0  . clonazePAM (KLONOPIN) 1 MG tablet    Sig: TAKE ONE TAB BY MOUTH TWICE DAILY FOR ANXIETY    Dispense:  60 tablet    Refill:  3    Total time spent:35 Minutes  Time spent includes review of chart, medications, test results, and follow up plan with the patient.     [name], MD  Internal Medicine

## 2020-01-19 ENCOUNTER — Telehealth: Payer: Self-pay | Admitting: Internal Medicine

## 2020-01-19 MED ORDER — GLIMEPIRIDE 1 MG PO TABS: ORAL_TABLET | ORAL | 3 refills | Status: DC

## 2020-01-23 ENCOUNTER — Telehealth: Payer: Self-pay

## 2020-01-24 LAB — URINE CULTURE, REFLEX

## 2020-01-24 LAB — MICROSCOPIC EXAMINATION
RBC, Urine: NONE SEEN /hpf (ref 0–2)
WBC, UA: >30 /hpf — AB (ref 0–5)

## 2020-01-24 LAB — UA/M W/RFLX CULTURE, ROUTINE
Bilirubin, UA: NEGATIVE
Glucose, UA: NEGATIVE
Nitrite, UA: NEGATIVE
RBC, UA: NEGATIVE
Specific Gravity, UA: 1.022 (ref 1.005–1.030)
Urobilinogen, Ur: 0.2 mg/dL (ref 0.2–1.0)
pH, UA: 5 (ref 5.0–7.5)

## 2020-01-28 NOTE — Telephone Encounter (Signed)
Patient was rescheduled for first available with Dr.Anna on 1.3.22. FYI

## 2020-01-30 NOTE — Telephone Encounter (Signed)
open encounter in error 

## 2020-01-31 ENCOUNTER — Ambulatory Visit: Payer: Medicare Other | Admitting: Internal Medicine

## 2020-02-05 DIAGNOSIS — J449 Chronic obstructive pulmonary disease, unspecified: Secondary | ICD-10-CM | POA: Diagnosis not present

## 2020-02-18 ENCOUNTER — Ambulatory Visit: Payer: Medicare Other | Admitting: Gastroenterology

## 2020-02-18 ENCOUNTER — Other Ambulatory Visit: Payer: Self-pay | Admitting: Internal Medicine

## 2020-02-18 DIAGNOSIS — G894 Chronic pain syndrome: Secondary | ICD-10-CM

## 2020-02-25 ENCOUNTER — Telehealth: Payer: Self-pay | Admitting: *Deleted

## 2020-02-25 ENCOUNTER — Telehealth: Payer: Self-pay

## 2020-02-25 NOTE — Telephone Encounter (Signed)
That will be up to her pcp. She has ony received RT to LUL lung nodule.

## 2020-02-25 NOTE — Telephone Encounter (Signed)
Please inform pt she can take 2 tabs of her  hydrocodone for severe pain, let us know if it works

## 2020-02-25 NOTE — Telephone Encounter (Signed)
On called reporting that patient is having severe pain under her left arm running down her left side. He called EMS but they refused to take her to ER for this and suggested he call PCP or [place] regarding this and that she needs to be seen before her appointment in March. Her pain medicine of Vicodin is not controlling her pain and he is asking for something to be done for her. He has also call Dr Trish Mage office regarding pain. Please advise

## 2020-02-25 NOTE — Telephone Encounter (Signed)
Call returned to son and he is awaiting a return call from Dr Humphrey Rolls

## 2020-02-25 NOTE — Telephone Encounter (Signed)
Pt son advised that she can try take 2 tab and see how is doing and call us back

## 2020-02-28 IMAGING — DX DG CHEST 1V
1 series · 2 of 2 positions shown · non-contrast
Comparison: None.

CLINICAL DATA: Chest pain

EXAM:
CHEST  1 VIEW

[Series 1: chest ap · 0.14mm/px · 2 of 2 slices shown]
[im 1/2]
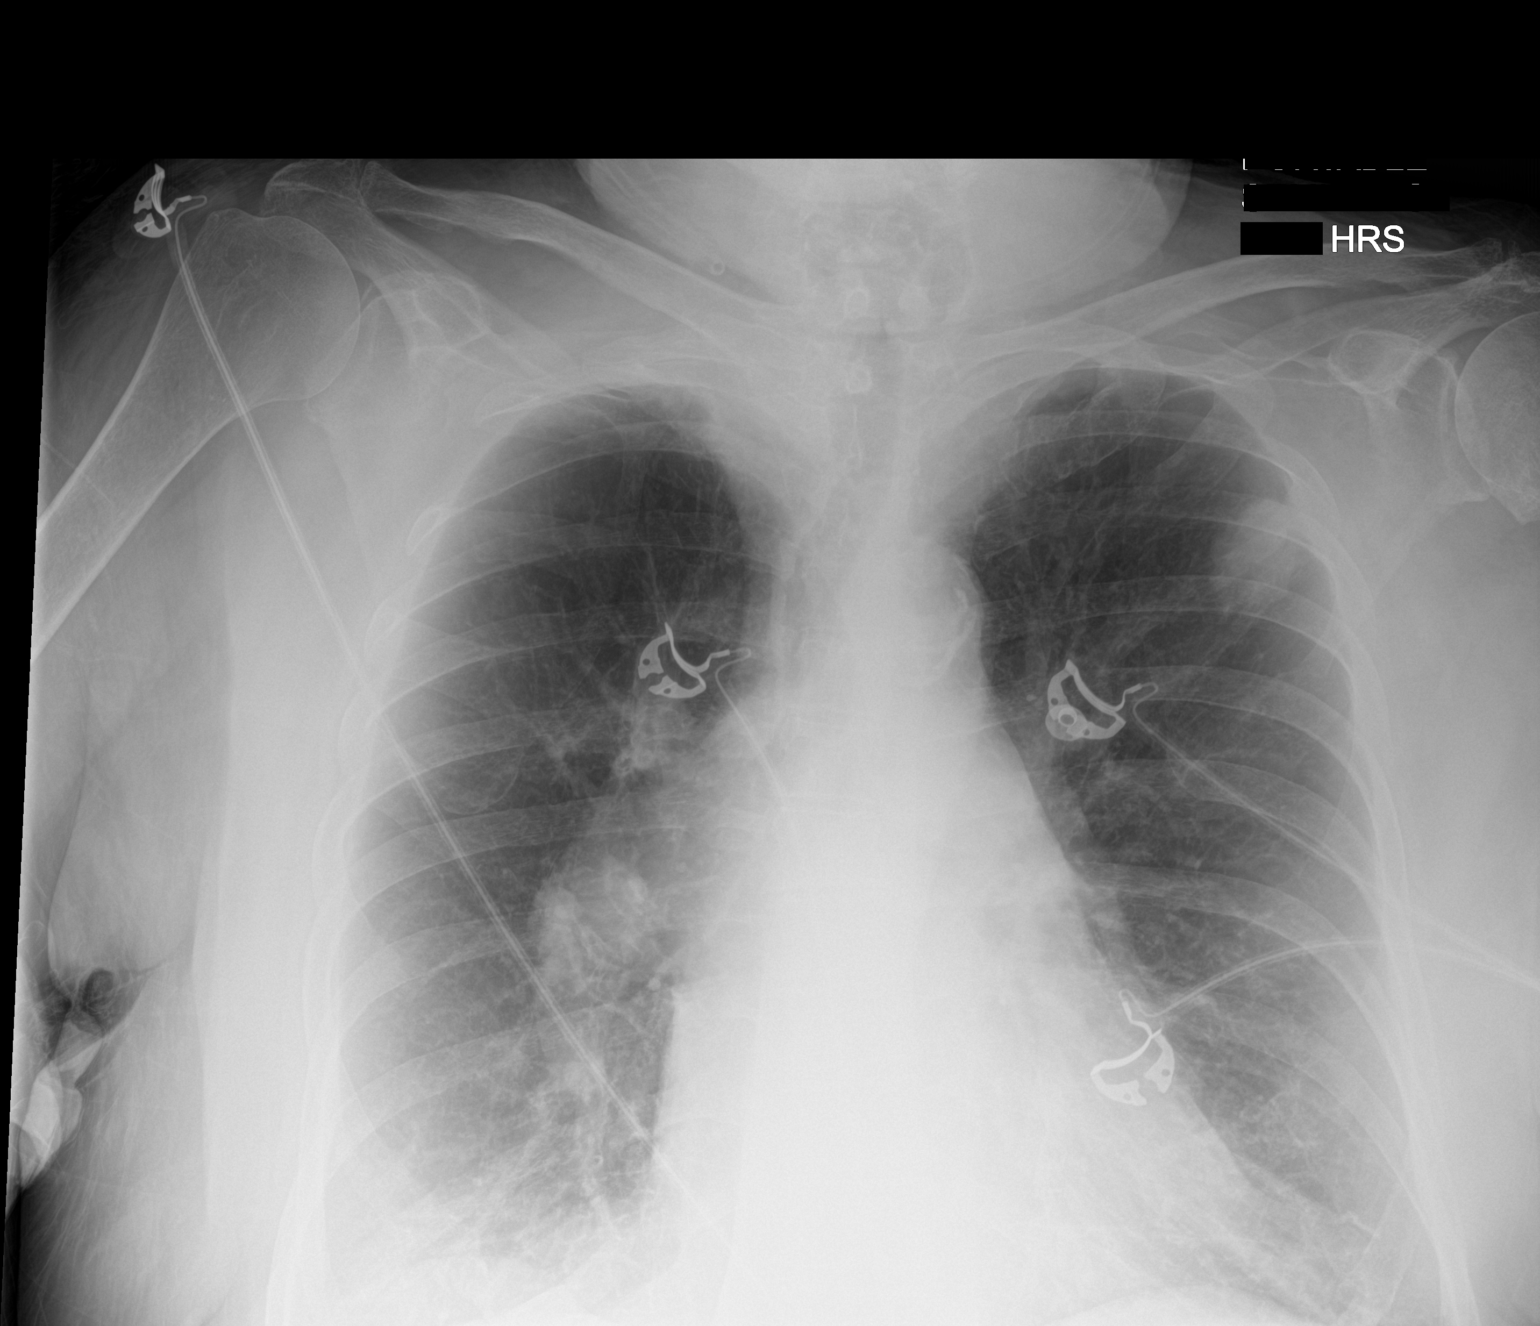
[im 2/2]
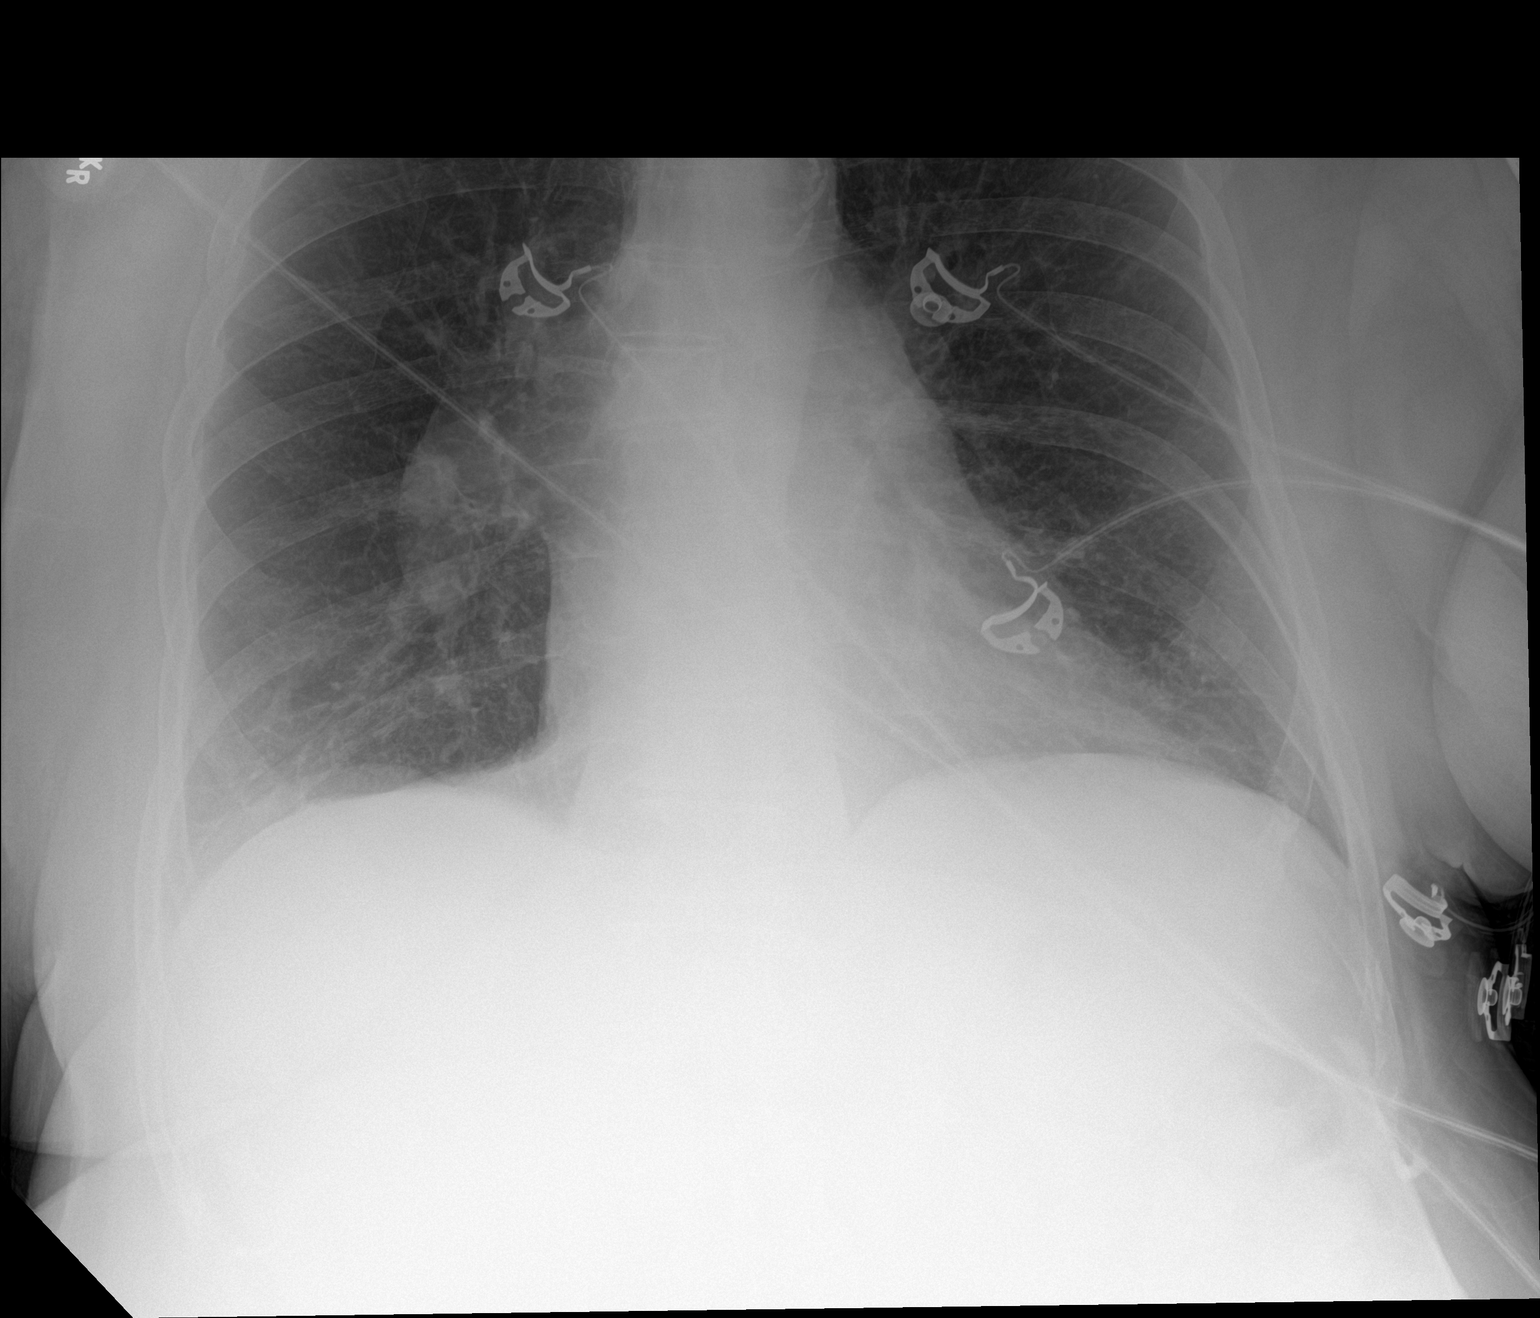

[2 of 2 positions shown; findings below may reference images not displayed]

FINDINGS: COPD with pulmonary hyperinflation. Left upper lobe mass lesion
approximately 2 x 3 cm.

Enlargement right hilum. This could be adenopathy or vascular
enlargement. Heart size normal. Negative for heart failure. No
pleural effusion.

Right paratracheal soft tissue density with mild displacement of the
trachea may represent goiter or vascular ectasia.. Atherosclerotic
aortic arch.
IMPRESSION: COPD

Left upper lobe mass possible carcinoma. Recommend CT chest with
contrast.

Enlargement of the right hilum which may be adenopathy or enlarged
pulmonary artery. This can be evaluated at CT chest with contrast.

## 2020-02-28 IMAGING — CT CT CHEST W/ CM
2 of 5 series · 12 of 36 positions shown, 15 images · IV contrast (omnipaque)
Comparison: None.

CLINICAL DATA: Chest and upper abdominal pain. Personal history of
COPD, congestive heart failure, hypertension, and diabetes.

EXAM:
CT CHEST, ABDOMEN, AND PELVIS WITH CONTRAST
TECHNIQUE: Multidetector CT imaging of the chest, abdomen and pelvis was
performed following the standard protocol during bolus
administration of intravenous contrast.
CONTRAST:  100mL OMNIPAQUE IOHEXOL 300 MG/ML  SOLN

[Series 2: cap with · axial · 0.79mm/px · z∈[-586,-120]mm · 9 of 117 slices shown, 12 images]
[im 12/117  mediastinal]
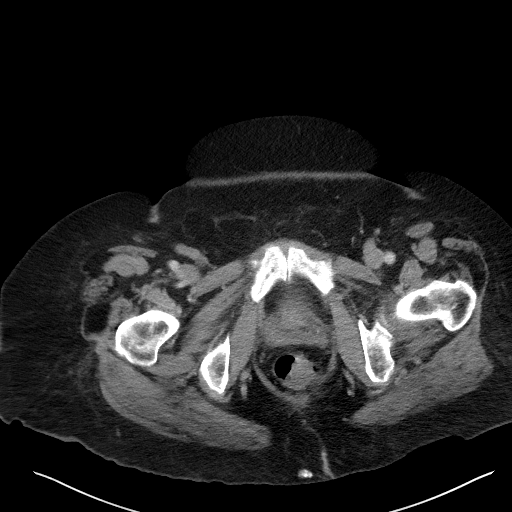
[im 12/117  lung]
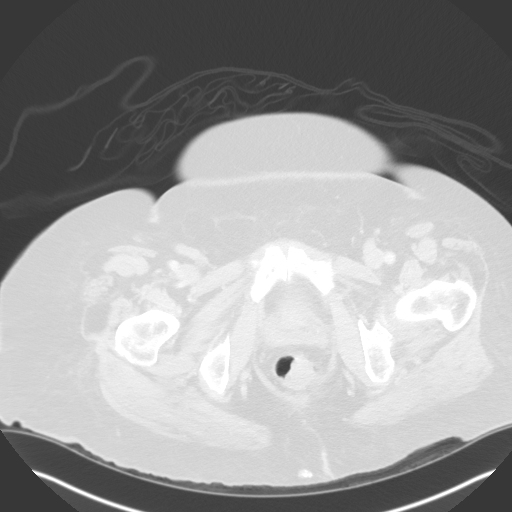
[im 24/117  lung]
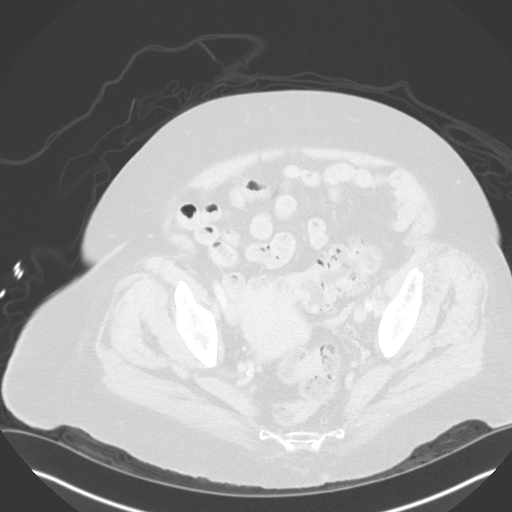
[im 35/117  lung]
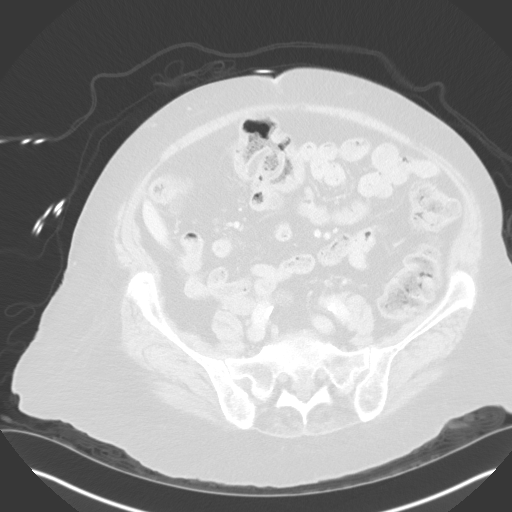
[im 47/117  lung]
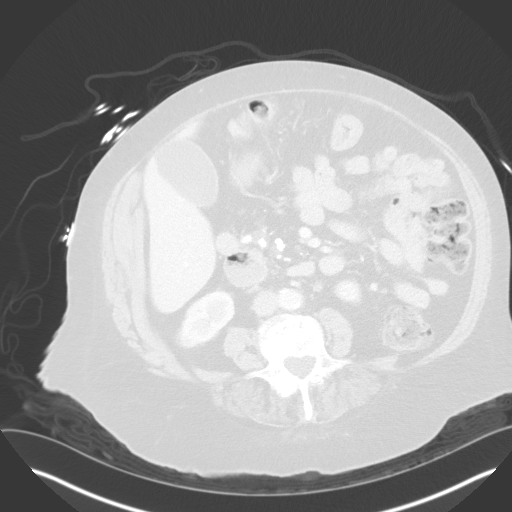
[im 59/117  mediastinal]
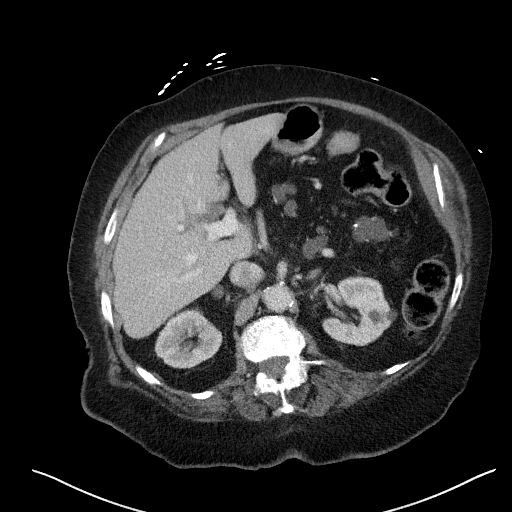
[im 59/117  lung]
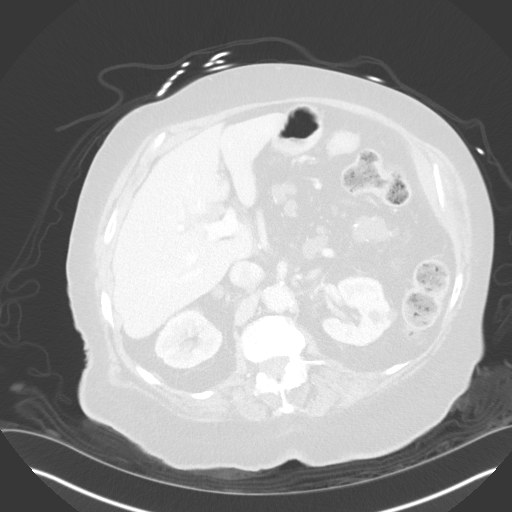
[im 70/117  lung]
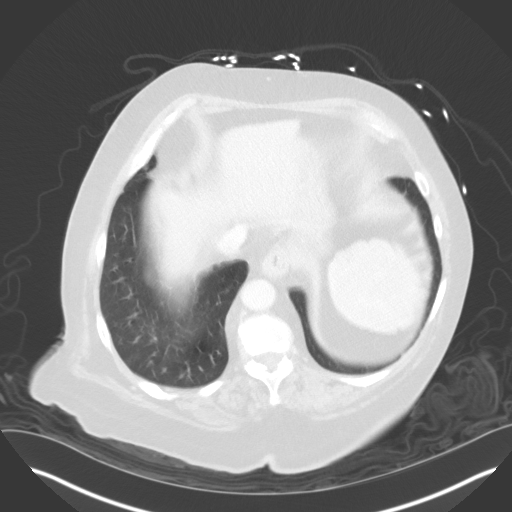
[im 82/117  lung]
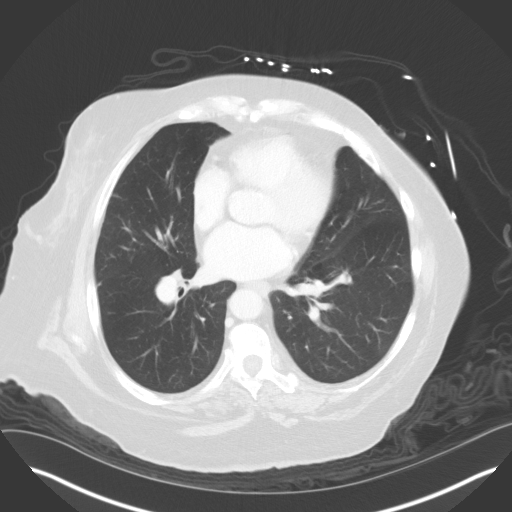
[im 93/117  lung]
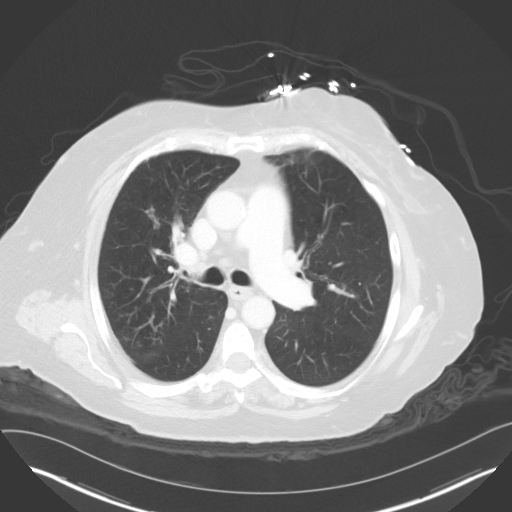
[im 105/117  mediastinal]
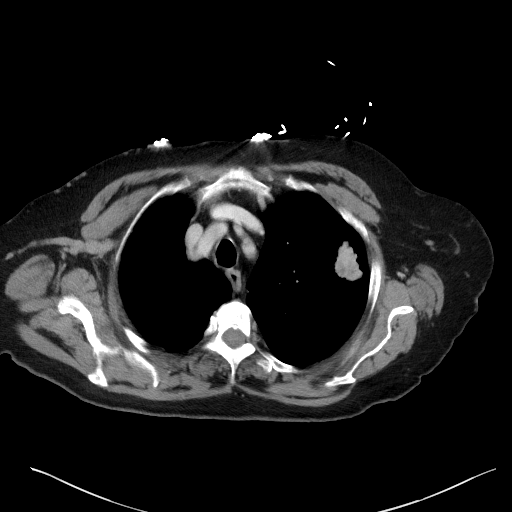
[im 105/117  lung]
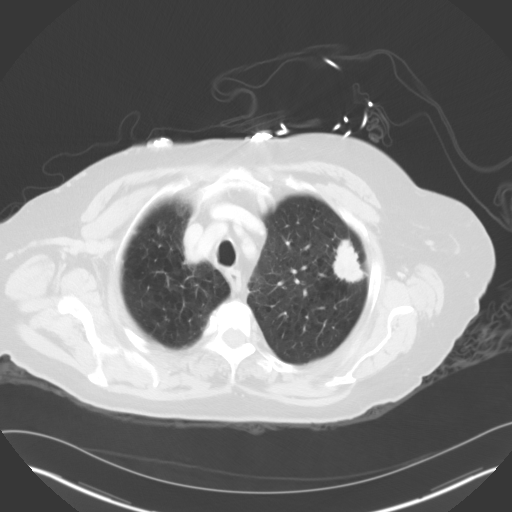

[Series 5: coronals · coronal · 0.77mm/px · 3 of 168 slices shown]
[im 34/168  lung]
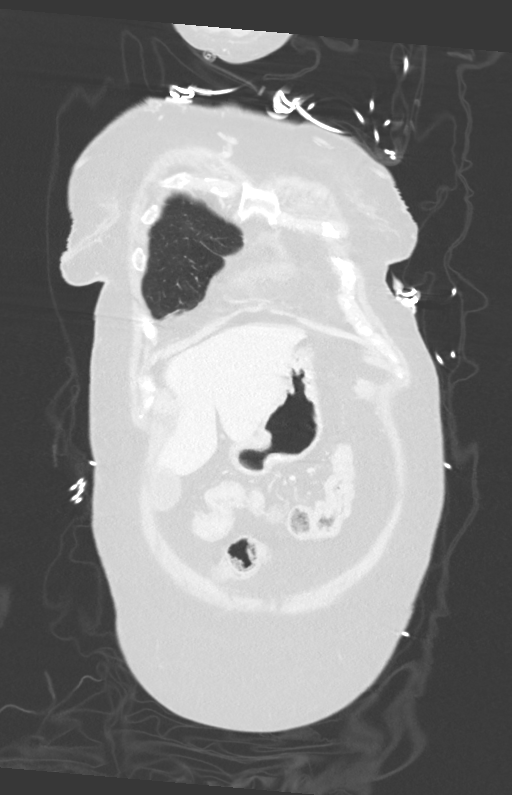
[im 67/168  lung]
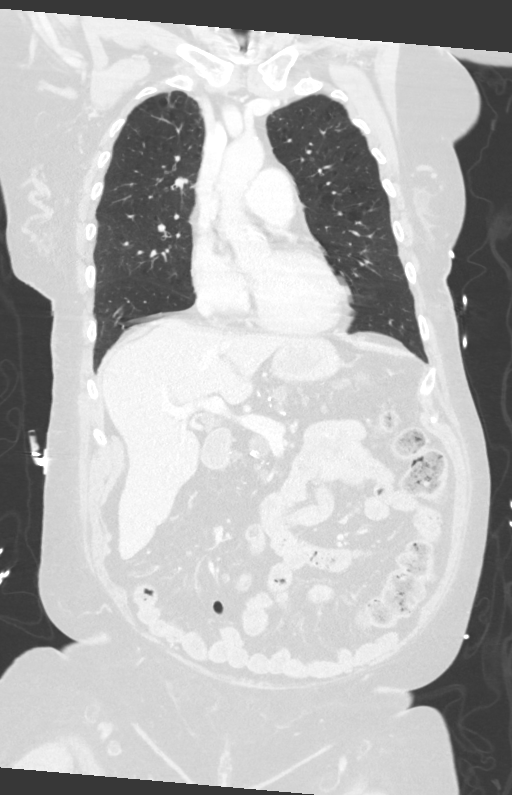
[im 101/168  lung]
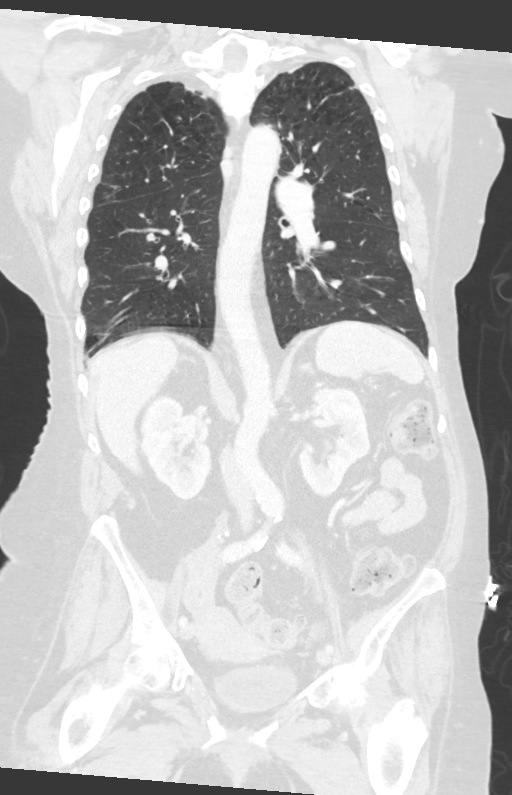

[12 of 36 positions shown; findings below may reference images not displayed]

FINDINGS: CT CHEST FINDINGS

Cardiovascular: Heart size is normal. Calcifications are present at
the aortic valve. Additional calcifications are present at the
aortic arch. There is no aneurysm. No significant stenosis is
present at the origins the great vessels.

Pulmonary arteries are enlarged bilaterally measuring up to 3.6 cm
on the right and 2.9 cm on the left. No definite pulmonary emboli
are present on this study. Of note, the study was not optimized for
evaluation of pulmonary emboli.

Mediastinum/Nodes: No significant mediastinal, hilar, or axillary
adenopathy is present. Subcentimeter left hilar lymph nodes are
present.

Lungs/Pleura: A spiculated mass in the left upper lobe measures
x 3.5 x 1.9 cm. A spiculated peribronchial nodule present in the
right upper lobe measuring 11 x 6 x 5 mm. There is some ground-glass
attenuation posteriorly in the right upper lobe. A 3 mm nodule is
present on image 45 of series 4. There is some scarring in the right
middle lobe. No other nodules are present. Centrilobular
emphysematous changes are noted

Musculoskeletal: Vertebral body heights alignment are maintained. A
hemangioma is present posteriorly at T7. No other focal lytic or
blastic lesions are present. Mild endplate changes are present at
T3-4.

CT ABDOMEN PELVIS FINDINGS

Hepatobiliary: No focal liver abnormality is seen. No gallstones,
gallbladder wall thickening, or biliary dilatation.

Pancreas: There is chronic obstruction of the pancreatic duct. The
duct is dilated. The pancreas is markedly atrophic. A pseudocyst is
present distally.

Spleen: Normal in size without focal abnormality.

Adrenals/Urinary Tract: There is some fullness of the left adrenal
gland without a discrete lesion. Subcentimeter cysts are present in
the left kidney. A nonobstructing 8 mm stone is present at the lower
pole of the right kidney and a second stone in the right kidney
measures up to 5 mm. No significant nephrolithiasis is present on
the left. Ureters are within normal limits. The urinary bladder is
normal.

Stomach/Bowel: Stomach is normal. A large duodenal diverticulum is
present without associated inflammatory change. Small bowel is
within normal limits. The ascending and transverse colon are within
normal limits. Diverticular changes are present in the sigmoid
colon. There is no associated inflammatory change.

Vascular/Lymphatic: No significant retroperitoneal adenopathy is
present. Atherosclerotic changes are present in the aorta and branch
vessels without aneurysm.

Reproductive: Uterus and bilateral adnexa are unremarkable.

Other: No abdominal wall hernia or abnormality. No abdominopelvic
ascites.

Musculoskeletal: There is fusion across the disc space at L4-5 and
L5-S1. Advanced degenerative disc disease is present at L1-2, L2-3,
and L3-4.
IMPRESSION: 1. Left upper lobe spiculated mass highly concerning for a primary
bronchogenic neoplasm. Recommend whole-body PET scan.
2. Additional nodule in the right upper lobe may represent a
synchronous tumor.
3. No significant mediastinal adenopathy.
4.  Emphysema ([id number]).
5. Fullness of the adrenal glands bilaterally without a definite
mass lesion. Metastatic disease could not be excluded. PET scan will
be helpful for further evaluation.
6.  Aortic Atherosclerosis ([id number]).
7. Marked pancreatic atrophy and chronic obstruction of the
pancreatic duct with diffuse duct dilation.
8. Bilateral nonobstructive nephrolithiasis.
9. Multilevel degenerative changes of the thoracolumbar spine.

## 2020-03-06 ENCOUNTER — Ambulatory Visit: Payer: Medicare Other | Admitting: Gastroenterology

## 2020-03-06 NOTE — Progress Notes (Deleted)
Lynn Bellows Robbins, MRCP(U.K) [address]  [place]  [place], [place] [address]  Main: [phone]  Fax: [phone]   Gastroenterology Consultation  Referring Provider:     Sindy Guadel[place], Robbins Primary Care Physician:  Lynn Guise, Robbins Primary Gastroenterologist:  Dr. Jonathon Robbins  Reason for Consultation:     ***        HPI:   Lynn Robbins is a 80 y.o. y/o female referred for consultation & management  by Lynn Robbins, Lynn Robbins.  ***  Past Medical History:  Diagnosis Date  . Cancer ([place])   . CHF (congestive heart failure) ([place])   . COPD (chronic obstructive pulmonary disease) ([place])   . Depression   . Diabetes mellitus without complication ([place])   . HOH (hard of hearing)   . Hypertension     Past Surgical History:  Procedure Laterality Date  . CESAREAN SECTION      Prior to Admission medications   Medication Sig Start Date End Date Taking? Authorizing Provider  Accu-Chek FastClix Lancets MISC Use as directed to check sugars. DX E11.65 09/01/18   Lynn Guise, Robbins  albuterol [facility] HFA) 108 [phone] Base) MCG/ACT inhaler TAKE 2 PUFFS BY MOUTH EVERY 4 HOURS AS NEEDED 06/20/19   Lynn Bane, NP  atenolol (TENORMIN) 25 MG tablet Take 1 tablet (25 mg total) by mouth daily. 01/03/20   Lynn Freshwater, NP  ciprofloxacin (CIPRO) 500 MG tablet Take 1 tablet (500 mg total) by mouth 2 (two) times daily. 11/01/19   Lynn Guise, Robbins  clonazePAM (KLONOPIN) 1 MG tablet TAKE ONE TAB BY MOUTH TWICE DAILY FOR ANXIETY 01/17/20   Lynn Guise, Robbins  glimepiride [facility]) 1 MG tablet Take one tab dialy with breakfast 01/19/20   Lynn Guise, Robbins  HYDROcodone-acetaminophen (NORCO/VICODIN) 5-325 MG tablet TAKE 1 TABLET BY MOUTH TWICE DAILY AS NEEDED FOR BACK PAIN 02/18/20   Lynn Guise, Robbins  losartan-hydrochlorothiazide [facility]) 50-12.5 MG tablet TAKE 1 TABLET BY MOUTH DAILY 10/29/19   Lynn Guise, Robbins  OXYGEN Inhale into the lungs.    Provider, Historical, Robbins  pantoprazole (PROTONIX) 40 MG  tablet Take 1 tablet (40 mg total) by mouth daily. 09/01/18   Lynn Guise, Robbins  SitaGLIPtin-MetFORMIN HCl (JANUMET XR) 50-500 MG TB24 TAKE ONE TABLET EVERY DAY WITH FOOD FOR DIABETES 10/03/19   Lynn Guise, Robbins  tiotropium [facility] HANDIHALER) 18 MCG inhalation capsule INHALE 1 CAPSULE VIA HANDIHALER ONCE DAILY AT THE SAME TIME EVERY DAY 09/01/18   Lynn Guise, Robbins    Family History  Problem Relation Age of Onset  . Diabetes Father   . Colon cancer Father   . Colon cancer Mother   . Aneurysm Mother 77       brain  . Lung cancer Sister   . Breast cancer Sister   . Lung cancer Brother   . Lung cancer Sister   . Bone cancer Brother   . Lung cancer Brother   . Cancer Sister        type unknown     Social History   Tobacco Use  . Smoking status: Former Smoker    Packs/day: 3.00    Years: 57.00    Pack years: 171.00    Types: Cigarettes    Quit date: 04/17/1998    Years since quitting: 21.9  . Smokeless tobacco: Former Systems developer    Types: Snuff  Vaping Use  . Vaping  Use: Never used  Substance Use Topics  . Alcohol use: No  . Drug use: No    Allergies as of 03/06/2020  . (No Known Allergies)    Review of Systems:    All systems reviewed and negative except where noted in HPI.   Physical Exam:  There were no vitals taken for this visit. No LMP recorded. Patient is postmenopausal. Psych:  Alert and cooperative. Normal mood and affect. General:   Alert,  Well-developed, well-nourished, pleasant and cooperative in NAD Head:  Normocephalic and atraumatic. Eyes:  Sclera clear, no icterus.   Conjunctiva pink. Ears:  Normal auditory acuity. Nose:  No deformity, discharge, or lesions. Mouth:  No deformity or lesions,oropharynx pink & moist. Neck:  Supple; no masses or thyromegaly. Lungs:  Respirations even and unlabored.  Clear throughout to auscultation.   No wheezes, crackles, or rhonchi. No acute distress. Heart:  Regular rate and rhythm; no murmurs, clicks, rubs, or  gallops. Abdomen:  Normal bowel sounds.  No bruits.  Soft, non-tender and non-distended without masses, hepatosplenomegaly or hernias noted.  No guarding or rebound tenderness.    Msk:  Symmetrical without gross deformities. Good, equal movement & strength bilaterally. Pulses:  Normal pulses noted. Extremities:  No clubbing or edema.  No cyanosis. Neurologic:  Alert and oriented x3;  grossly normal neurologically. Skin:  Intact without significant lesions or rashes. No jaundice. Lymph Nodes:  No significant cervical adenopathy. Psych:  Alert and cooperative. Normal mood and affect.  Imaging Studies: No results found.  Assessment and Plan:   Lynn Robbins is a 80 y.o. y/o female has been referred for ***  Follow up in ***  Dr Lynn Bellows Robbins,MRCP([place])

## 2020-03-07 DIAGNOSIS — J449 Chronic obstructive pulmonary disease, unspecified: Secondary | ICD-10-CM | POA: Diagnosis not present

## 2020-03-19 ENCOUNTER — Other Ambulatory Visit: Payer: Self-pay | Admitting: Hospice and Palliative Medicine

## 2020-03-19 ENCOUNTER — Other Ambulatory Visit: Payer: Self-pay | Admitting: Internal Medicine

## 2020-03-19 DIAGNOSIS — G894 Chronic pain syndrome: Secondary | ICD-10-CM

## 2020-04-07 DIAGNOSIS — J449 Chronic obstructive pulmonary disease, unspecified: Secondary | ICD-10-CM | POA: Diagnosis not present

## 2020-04-09 ENCOUNTER — Ambulatory Visit (INDEPENDENT_AMBULATORY_CARE_PROVIDER_SITE_OTHER): Payer: Medicare Other | Admitting: Gastroenterology

## 2020-04-09 ENCOUNTER — Other Ambulatory Visit: Payer: Self-pay

## 2020-04-09 VITALS — BP 120/70 | HR 83 | Temp 98.0°F | Ht 62.0 in | Wt 170.0 lb

## 2020-04-09 DIAGNOSIS — K862 Cyst of pancreas: Secondary | ICD-10-CM

## 2020-04-09 DIAGNOSIS — R300 Dysuria: Secondary | ICD-10-CM | POA: Diagnosis not present

## 2020-04-09 NOTE — Progress Notes (Signed)
Jonathon Bellows MD, MRCP(U.K) [address]  [place]  [place], [place] [address]  Main: [phone]  Fax: [phone]   Gastroenterology Consultation  Referring Provider:     Sindy Guadeloupe, MD Primary Care Physician:  [name], MD Primary Gastroenterologist:  Dr. Jonathon Bellows  Reason for Consultation: Cystic mass of pancreas        HPI:   Lynn Robbins is a 80 y.o. y/o female referred for a cystic mass of the pancreas.  She follows with Dr. For stage I lung cancer of the left upper lobe.  She has history of COPD on home oxygen, hypertension, CHF.  ER visit in February 2020 with chest pain which led to the CT chest and abdomen which showed a spiculated mass concerning for lung cancer.  Cystic mass has been noted in the CT abdomen and pelvis with contrast in the pancreatic tail which is 4.6 x 3.3 cm and appears increased in size compared to prior exam with diffuse ductal dilation of the main pancreatic duct to the level of the pancreatic head with calcifications.  She would not be a candidate for Whipple surgery.  CMP when checked in September 2021 showed no abnormal LFTs.  She is here today with her daughter.  The patient has significant hearing impairment.  Denies any abdominal pain.  Some weight loss.  She is on oxygen long-term and came into the office with a concentrator.  She was initially referred to see Dr. Rush Landmark at [facility] but chose to come and see [place] as she did not want to travel as far as [place] Past Medical History:  Diagnosis Date  . Cancer ([place])   . CHF (congestive heart failure) ([place])   . COPD (chronic obstructive pulmonary disease) ([place])   . Depression   . Diabetes mellitus without complication ([place])   . HOH (hard of hearing)   . Hypertension     Past Surgical History:  Procedure Laterality Date  . CESAREAN SECTION      Prior to Admission medications   Medication Sig Start Date End Date Taking? Authorizing Provider  Accu-Chek FastClix  Lancets MISC Use as directed to check sugars. DX E11.65 09/01/18   [name], MD  albuterol [facility] HFA) [phone] Base) MCG/ACT inhaler TAKE 2 PUFFS BY MOUTH EVERY 4 HOURS AS NEEDED 06/20/19   [name], NP  atenolol (TENORMIN) 25 MG tablet Take 1 tablet (25 mg total) by mouth daily. 01/03/20   Ronnell Freshwater, NP  ciprofloxacin (CIPRO) 500 MG tablet Take 1 tablet (500 mg total) by mouth 2 (two) times daily. 11/01/19   [name], MD  clonazePAM (KLONOPIN) 1 MG tablet TAKE ONE TAB BY MOUTH TWICE DAILY FOR ANXIETY 01/17/20   [name], MD  glimepiride [facility]) 1 MG tablet Take one tab dialy with breakfast 01/19/20   [name], MD  HYDROcodone-acetaminophen (NORCO/VICODIN) 5-325 MG tablet TAKE 1 TABLET BY MOUTH TWICE DAILY AS NEEDED FOR BACK PAIN 03/19/20   [name], MD  losartan-hydrochlorothiazide [facility]) 50-12.5 MG tablet TAKE 1 TABLET BY MOUTH DAILY 10/29/19   [name], MD  OXYGEN Inhale into the lungs.    [provider]  pantoprazole (PROTONIX) 40 MG tablet Take 1 tablet (40 mg total) by mouth daily. 09/01/18   [name], MD  SitaGLIPtin-MetFORMIN HCl (JANUMET XR) 50-500 MG TB24 TAKE ONE TABLET EVERY DAY WITH FOOD FOR DIABETES 10/03/19   [name], MD  tiotropium (SPIRIVA HANDIHALER) 18 MCG inhalation capsule INHALE 1 CAPSULE VIA HANDIHALER ONCE DAILY AT THE SAME TIME EVERY DAY 09/01/18   [name], MD    Family History  Problem Relation Age of Onset  . Diabetes Father   . Colon cancer Father   . Colon cancer Mother   . Aneurysm Mother 105       brain  . Lung cancer Sister   . Breast cancer Sister   . Lung cancer Brother   . Lung cancer Sister   . Bone cancer Brother   . Lung cancer Brother   . Cancer Sister        type unknown     Social History   Tobacco Use  . Smoking status: Former Smoker    Packs/day: 3.00    Years: 57.00    Pack years: 171.00    Types: Cigarettes    Quit date: 04/17/1998    Years since quitting: 21.9  .  Smokeless tobacco: Former Systems developer    Types: Snuff  Vaping Use  . Vaping Use: Never used  Substance Use Topics  . Alcohol use: No  . Drug use: No    Allergies as of 04/09/2020  . (No Known Allergies)    Review of Systems:    All systems reviewed and negative except where noted in HPI.   Physical Exam:  There were no vitals taken for this visit. No LMP recorded. Patient is postmenopausal. Psych:  Alert and cooperative. Normal mood and affect. General:   Alert,  Well-developed, well-nourished, pleasant and cooperative in NAD Head:  Normocephalic and atraumatic. Eyes:  Sclera clear, no icterus.   Conjunctiva pink...    Neurologic:  Alert and oriented x3;  grossly normal neurologically. Psych:  Alert and cooperative. Normal mood and affect.  Imaging Studies: No results found.  Assessment and Plan:   Lynn Robbins is a 80 y.o. y/o female has been referred for evaluation of cystic mass incidentally found on CT abdomen performed during the evaluation of a spiculated mass of the lung.  She follows with oncology and carries a diagnosis of stage I lung cancer.  She is on home oxygen for COPD along with CHF.  Mass seen in the pancreas in September 2021 shows increase in size of 4.6 cm lesion in the pancreatic tail with marked diffuse dilation of the main pancreatic duct and possibly tiny intraductal soft tissue nodule.  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  Per oncology she is not a candidate for Whipple's surgery .  She has some dysuria and I will check a urine sample   I have called and spoken to Dr. Janese Banks about the patient in terms of her performance status and goals of care particularly in relation to benefit of any endoscopy procedures such as EUS which would require anesthesia and the risk associated with it and possible results and its relevance to any change of management.  We agreed that it would probably be best to obtain a baseline MRI of the  pancreas to check if the lesion has remained stable or has increased in size.  If it has remained stable probably surveillance with imaging would be the best option considering her comorbidities.  We can always discuss options once we get the imaging results.  I have discussed this plan with the patient and her daughter and they also agree that this is the best way moving forward  Follow up in 8 weeks  Dr Jonathon Bellows MD,MRCP(U.K)

## 2020-04-10 LAB — CBC WITH DIFFERENTIAL/PLATELET
Basophils Absolute: 0.1 10*3/uL (ref 0.0–0.2)
Basos: 1 %
EOS (ABSOLUTE): 0.1 10*3/uL (ref 0.0–0.4)
Eos: 1 %
Hematocrit: 40.4 % (ref 34.0–46.6)
Hemoglobin: 12.9 g/dL (ref 11.1–15.9)
Immature Grans (Abs): 0 10*3/uL (ref 0.0–0.1)
Immature Granulocytes: 0 %
Lymphocytes Absolute: 1.5 10*3/uL (ref 0.7–3.1)
Lymphs: 18 %
MCH: 30.8 pg (ref 26.6–33.0)
MCHC: 31.9 g/dL (ref 31.5–35.7)
MCV: 96 fL (ref 79–97)
Monocytes Absolute: 0.5 10*3/uL (ref 0.1–0.9)
Monocytes: 6 %
Neutrophils Absolute: 6.1 10*3/uL (ref 1.4–7.0)
Neutrophils: 74 %
Platelets: 227 10*3/uL (ref 150–450)
RBC: 4.19 x10E6/uL (ref 3.77–5.28)
RDW: 13.8 % (ref 11.7–15.4)
WBC: 8.3 10*3/uL (ref 3.4–10.8)

## 2020-04-10 LAB — COMPREHENSIVE METABOLIC PANEL
ALT: 14 IU/L (ref 0–32)
AST: 18 IU/L (ref 0–40)
Albumin/Globulin Ratio: 1.4 (ref 1.2–2.2)
Albumin: 3.7 g/dL (ref 3.7–4.7)
Alkaline Phosphatase: 101 IU/L (ref 44–121)
BUN/Creatinine Ratio: 18 (ref 12–28)
BUN: 17 mg/dL (ref 8–27)
Bilirubin Total: 0.3 mg/dL (ref 0.0–1.2)
CO2: 24 mmol/L (ref 20–29)
Calcium: 9.5 mg/dL (ref 8.7–10.3)
Chloride: 105 mmol/L (ref 96–106)
Creatinine, Ser: 0.93 mg/dL (ref 0.57–1.00)
GFR calc Af Amer: 68 mL/min/{1.73_m2} (ref >59)
GFR calc non Af Amer: 59 mL/min/{1.73_m2} — ABNORMAL LOW (ref >59)
Globulin, Total: 2.7 g/dL (ref 1.5–4.5)
Glucose: 192 mg/dL — ABNORMAL HIGH (ref 65–99)
Potassium: 4.1 mmol/L (ref 3.5–5.2)
Sodium: 146 mmol/L — ABNORMAL HIGH (ref 134–144)
Total Protein: 6.4 g/dL (ref 6.0–8.5)

## 2020-04-15 NOTE — Progress Notes (Signed)
Thanks

## 2020-04-17 ENCOUNTER — Other Ambulatory Visit: Payer: Self-pay | Admitting: Internal Medicine

## 2020-04-17 DIAGNOSIS — G894 Chronic pain syndrome: Secondary | ICD-10-CM

## 2020-04-18 ENCOUNTER — Other Ambulatory Visit: Payer: Self-pay | Admitting: Hospice and Palliative Medicine

## 2020-04-18 DIAGNOSIS — F411 Generalized anxiety disorder: Secondary | ICD-10-CM

## 2020-04-18 DIAGNOSIS — G894 Chronic pain syndrome: Secondary | ICD-10-CM

## 2020-04-18 MED ORDER — HYDROCODONE-ACETAMINOPHEN 5-325 MG PO TABS: ORAL_TABLET | ORAL | 0 refills | Status: DC

## 2020-04-18 MED ORDER — CLONAZEPAM 1 MG PO TABS: ORAL_TABLET | ORAL | 3 refills | Status: DC

## 2020-04-18 MED ORDER — JANUMET XR 50-500 MG PO TB24
ORAL_TABLET | ORAL | 3 refills | Status: DC
Start: 2020-04-18 — End: 2020-08-20

## 2020-04-25 ENCOUNTER — Other Ambulatory Visit: Payer: Self-pay

## 2020-04-25 DIAGNOSIS — I10 Essential (primary) hypertension: Secondary | ICD-10-CM

## 2020-04-25 DIAGNOSIS — E1165 Type 2 diabetes mellitus with hyperglycemia: Secondary | ICD-10-CM

## 2020-04-25 MED ORDER — GLIMEPIRIDE 1 MG PO TABS: ORAL_TABLET | ORAL | 3 refills | Status: DC

## 2020-04-25 MED ORDER — LOSARTAN POTASSIUM-HCTZ 50-12.5 MG PO TABS: 1.0000 | ORAL_TABLET | Freq: Every day | ORAL | 3 refills | Status: DC

## 2020-04-28 ENCOUNTER — Ambulatory Visit
Admission: RE | Admit: 2020-04-28 | Discharge: 2020-04-28 | Disposition: A | Discharge status: Home / Self Care | Payer: Medicare Other | Source: Ambulatory Visit | Attending: Gastroenterology | Admitting: Gastroenterology

## 2020-04-28 ENCOUNTER — Other Ambulatory Visit: Payer: Self-pay

## 2020-04-28 ENCOUNTER — Other Ambulatory Visit: Payer: Self-pay | Admitting: Gastroenterology

## 2020-04-28 DIAGNOSIS — K862 Cyst of pancreas: Secondary | ICD-10-CM

## 2020-04-28 DIAGNOSIS — Z85118 Personal history of other malignant neoplasm of bronchus and lung: Secondary | ICD-10-CM | POA: Diagnosis not present

## 2020-04-28 DIAGNOSIS — K8689 Other specified diseases of pancreas: Secondary | ICD-10-CM | POA: Diagnosis not present

## 2020-04-28 DIAGNOSIS — D3502 Benign neoplasm of left adrenal gland: Secondary | ICD-10-CM | POA: Diagnosis not present

## 2020-04-28 MED ORDER — GADOBUTROL 1 MMOL/ML IV SOLN
7.0000 mL | Freq: Once | INTRAVENOUS | Status: AC | PRN
  Administered 2020-04-28: 7 mL via INTRAVENOUS

## 2020-05-05 ENCOUNTER — Telehealth: Payer: Self-pay | Admitting: Gastroenterology

## 2020-05-05 DIAGNOSIS — J449 Chronic obstructive pulmonary disease, unspecified: Secondary | ICD-10-CM | POA: Diagnosis not present

## 2020-05-05 NOTE — Telephone Encounter (Signed)
Patient is calling about MRI results.

## 2020-05-06 NOTE — Telephone Encounter (Signed)
Features of chronic pancreatitis seen, pancreatic cyst seen.  Benign adrenal adenoma and renal cyst also seen.  I think it would be better to discuss everything and plan for the next steps with the patient either in person or video visit which is  convenient to her.  Please schedule

## 2020-05-08 ENCOUNTER — Ambulatory Visit: Payer: Medicare Other

## 2020-05-08 NOTE — Telephone Encounter (Signed)
Pt has been notified of results and Dr. Anna's recommendations. 

## 2020-05-13 ENCOUNTER — Inpatient Hospital Stay: Payer: Medicare Other | Admitting: Oncology

## 2020-05-15 ENCOUNTER — Other Ambulatory Visit: Payer: Self-pay

## 2020-05-15 ENCOUNTER — Ambulatory Visit
Admission: RE | Admit: 2020-05-15 | Discharge: 2020-05-15 | Disposition: A | Discharge status: Home / Self Care | Payer: Medicare Other | Source: Ambulatory Visit | Attending: Oncology | Admitting: Oncology

## 2020-05-15 DIAGNOSIS — Z85118 Personal history of other malignant neoplasm of bronchus and lung: Secondary | ICD-10-CM | POA: Diagnosis not present

## 2020-05-15 DIAGNOSIS — Z08 Encounter for follow-up examination after completed treatment for malignant neoplasm: Secondary | ICD-10-CM | POA: Insufficient documentation

## 2020-05-15 DIAGNOSIS — R0789 Other chest pain: Secondary | ICD-10-CM | POA: Diagnosis not present

## 2020-05-19 ENCOUNTER — Inpatient Hospital Stay: Payer: Medicare Other | Attending: Oncology | Admitting: Oncology

## 2020-05-19 ENCOUNTER — Other Ambulatory Visit: Payer: Self-pay

## 2020-05-19 ENCOUNTER — Telehealth: Payer: Self-pay

## 2020-05-19 ENCOUNTER — Inpatient Hospital Stay: Payer: Medicare Other | Admitting: Oncology

## 2020-05-19 ENCOUNTER — Other Ambulatory Visit: Payer: Self-pay | Admitting: Hospice and Palliative Medicine

## 2020-05-19 ENCOUNTER — Encounter: Payer: Self-pay | Admitting: Oncology

## 2020-05-19 DIAGNOSIS — C3412 Malignant neoplasm of upper lobe, left bronchus or lung: Secondary | ICD-10-CM

## 2020-05-19 DIAGNOSIS — G894 Chronic pain syndrome: Secondary | ICD-10-CM

## 2020-05-19 MED ORDER — CIPROFLOXACIN HCL 500 MG PO TABS: 500.0000 mg | ORAL_TABLET | Freq: Two times a day (BID) | ORAL | 0 refills | Status: DC

## 2020-05-19 NOTE — Telephone Encounter (Signed)
Will fill at appointment tomorrow.

## 2020-05-19 NOTE — Telephone Encounter (Signed)
Pt son called that pt had UTI  And she unable to come in as per dr Humphrey Rolls send cipro for 10 days and also its ok to keep virtual for tomorrow

## 2020-05-19 NOTE — Progress Notes (Signed)
I connected with Lynn Robbins on 05/19/20 at  2:30 PM EDT by telephone visit and verified that I am speaking with the correct person using two identifiers.   I discussed the limitations, risks, security and privacy concerns of performing an evaluation and management service by telemedicine and the availability of in-person appointments. I also discussed with the patient that there may be a patient responsible charge related to this service. The patient expressed understanding and agreed to proceed.  Other persons participating in the visit and their role in the encounter:  Patients son Elta Guadeloupe  Patient's location:  home Provider's location:  work  Risk analyst Complaint: Discuss CT scan results and further management  History of present illness: patient is a 80 year old female with a past medical history significant for COPD for which she is on continuous oxygen 2 L, hypertension, CHF who presented to the ER on 04/10/2018 with some symptoms of chest wall pain which led toCT chest and CT abdomen. CT scan showed left upper lobe spiculated mass highly concerning for primary lung cancer. There was also a second nodule in the right upper lobe which may represent synchronous tumor. No significant mediastinal adenopathy. This was followed by a PET CT scan on 04/14/2018 which showed hypermetabolic left upper lobe lung mass 3.5 x 2.3 cm with an SUV of 13.5. Second nodule in the right upper lobe measuring 14 mm which did not show any hypermetabolism. No hypermetabolic mediastinal lymph nodes.Patient received SBRT to that the lesion  Interval history: Patient reports chronic fatigue and exertional shortness of breath.  She is currently having symptoms of dysuria and will be discussing with her PCP further.   Review of Systems  Constitutional: Positive for malaise/fatigue. Negative for chills, fever and weight loss.  HENT: Negative for congestion, ear discharge and nosebleeds.   Eyes: Negative for blurred  vision.  Respiratory: Positive for shortness of breath. Negative for cough, hemoptysis, sputum production and wheezing.   Cardiovascular: Negative for chest pain, palpitations, orthopnea and claudication.  Gastrointestinal: Negative for abdominal pain, blood in stool, constipation, diarrhea, heartburn, melena, nausea and vomiting.  Genitourinary: Positive for dysuria. Negative for flank pain, frequency, hematuria and urgency.  Musculoskeletal: Negative for back pain, joint pain and myalgias.  Skin: Negative for rash.  Neurological: Negative for dizziness, tingling, focal weakness, seizures, weakness and headaches.  Endo/Heme/Allergies: Does not bruise/bleed easily.  Psychiatric/Behavioral: Negative for depression and suicidal ideas. The patient does not have insomnia.     No Known Allergies  Past Medical History:  Diagnosis Date  . Cancer ([place])   . CHF (congestive heart failure) ([place])   . COPD (chronic obstructive pulmonary disease) ([place])   . Depression   . Diabetes mellitus without complication ([place])   . HOH (hard of hearing)   . Hypertension     Past Surgical History:  Procedure Laterality Date  . CESAREAN SECTION      Social History   Socioeconomic History  . Marital status: Single    Spouse name: Not on file  . Number of children: 5  . Years of education: Not on file  . Highest education level: Not on file  Occupational History  . Not on file  Tobacco Use  . Smoking status: Former Smoker    Packs/day: 3.00    Years: 57.00    Pack years: 171.00    Types: Cigarettes    Quit date: 04/17/1998    Years since quitting: 22.1  . Smokeless tobacco: Former Systems developer    Types: Snuff  Vaping Use  . Vaping Use: Never used  Substance and Sexual Activity  . Alcohol use: No  . Drug use: No  . Sexual activity: Never  Other Topics Concern  . Not on file  Social History Narrative   ** Merged History Encounter **       Social Determinants of Health   Financial Resource  Strain: Not on file  Food Insecurity: Not on file  Transportation Needs: Not on file  Physical Activity: Not on file  Stress: Not on file  Social Connections: Not on file  Intimate Partner Violence: Not on file    Family History  Problem Relation Age of Onset  . Diabetes Father   . Colon cancer Father   . Colon cancer Mother   . Aneurysm Mother 82       brain  . Lung cancer Sister   . Breast cancer Sister   . Lung cancer Brother   . Lung cancer Sister   . Bone cancer Brother   . Lung cancer Brother   . Cancer Sister        type unknown     Current Outpatient Medications:  .  Accu-Chek FastClix Lancets MISC, Use as directed to check sugars. DX E11.65, Disp: 306 each, Rfl: 3 .  albuterol (PROAIR HFA) 108 (90 Base) MCG/ACT inhaler, TAKE 2 PUFFS BY MOUTH EVERY 4 HOURS AS NEEDED, Disp: 54 g, Rfl: 4 .  atenolol (TENORMIN) 25 MG tablet, Take 1 tablet (25 mg total) by mouth daily., Disp: 90 tablet, Rfl: 3 .  clonazePAM (KLONOPIN) 1 MG tablet, TAKE ONE TAB BY MOUTH TWICE DAILY FOR ANXIETY, Disp: 60 tablet, Rfl: 3 .  glimepiride (AMARYL) 1 MG tablet, Take one tab dialy with breakfast, Disp: 90 tablet, Rfl: 3 .  HYDROcodone-acetaminophen (NORCO/VICODIN) 5-325 MG tablet, Take one tablet by mouth twice daily as needed for severe pain., Disp: 60 tablet, Rfl: 0 .  losartan-hydrochlorothiazide (HYZAAR) 50-12.5 MG tablet, Take 1 tablet by mouth daily., Disp: 90 tablet, Rfl: 3 .  OXYGEN, Inhale into the lungs., Disp: , Rfl:  .  pantoprazole (PROTONIX) 40 MG tablet, Take 1 tablet (40 mg total) by mouth daily., Disp: 90 tablet, Rfl: 3 .  SitaGLIPtin-MetFORMIN HCl (JANUMET XR) 50-500 MG TB24, TAKE ONE TABLET EVERY DAY WITH FOOD FOR DIABETES, Disp: 90 tablet, Rfl: 3 .  tiotropium (SPIRIVA HANDIHALER) 18 MCG inhalation capsule, INHALE 1 CAPSULE VIA HANDIHALER ONCE DAILY AT THE SAME TIME EVERY DAY, Disp: 90 capsule, Rfl: 3 .  ciprofloxacin (CIPRO) 500 MG tablet, Take 1 tablet (500 mg total) by mouth 2  (two) times daily., Disp: 20 tablet, Rfl: 0  CT Chest Wo Contrast  Result Date: 05/16/2020 CLINICAL DATA:  80 year old female with history of COPD. Chest wall pain. Prior history of lung cancer. Follow-up study. EXAM: CT CHEST WITHOUT CONTRAST TECHNIQUE: Multidetector CT imaging of the chest was performed following the standard protocol without IV contrast. COMPARISON:  Multiple priors, most recently 11/13/2019. FINDINGS: Cardiovascular: Heart size is normal. There is no significant pericardial fluid, thickening or pericardial calcification. There is aortic atherosclerosis, as well as atherosclerosis of the great vessels of the mediastinum and the coronary arteries, including calcified atherosclerotic plaque in the left main, left anterior descending and right coronary arteries. Calcifications of the aortic valve. Mediastinum/Nodes: No pathologically enlarged mediastinal or hilar lymph nodes. Please note that accurate exclusion of hilar adenopathy is limited on noncontrast CT scans. Esophagus is unremarkable in appearance. No axillary lymphadenopathy. Lungs/Pleura: Left upper lobe pulmonary  nodulea (axial image 32 of series 3), similar to prior studies currently measuring approximately 1.9 x 1.5 cm with surrounding ground-glass attenuation, septal thickening and architectural distortion, which likely reflects chronic postradiation scarring. A few scattered 2-4 mm calcified and noncalcified pulmonary nodules are again noted in the lungs bilaterally, stable in size and number compared to prior studies, considered benign. No other new suspicious appearing pulmonary nodules or masses are noted. No acute consolidative airspace disease. No pleural effusions. Diffuse bronchial wall thickening with mild to moderate centrilobular and paraseptal emphysema. Upper Abdomen: Aortic atherosclerosis. 1.6 x 1.2 cm low-attenuation (1 Hounsfield unit) left adrenal nodule, similar to prior studies, most compatible with a benign  adenoma. Low-attenuation lesion associated with the distal body and tail of the pancreas (axial image 131 of series 2) measuring 4.5 x 3.2 cm, incompletely characterized on today's non-contrast CT examination, but similar to numerous prior studies, potentially a pancreatic pseudocyst or IPMN (intraductal papillary mucinous neoplasm). Additional smaller cystic appearing area in the distal tail of the pancreas measuring 2.0 x 1.8 cm (axial image 136 of series 2), also stable compared to prior examination. Musculoskeletal: There are no aggressive appearing lytic or blastic lesions noted in the visualized portions of the skeleton. IMPRESSION: 1. Treated left upper lobe pulmonary nodules grossly stable compared to prior examinations with surrounding chronic postradiation changes. 2. Small 2-4 mm pulmonary nodules scattered elsewhere in the lungs bilaterally, stable compared to prior studies, considered benign. 3. Diffuse bronchial wall thickening with mild to moderate centrilobular and paraseptal emphysema; imaging findings suggestive of underlying COPD. 4. Cystic appearing lesions in the distal body and tail of the pancreas, grossly similar to prior examinations, better characterized on prior abdominal MRI 04/28/2020. 5. Aortic atherosclerosis, in addition to left main and 2 vessel coronary artery disease. Assessment for potential risk factor modification, dietary therapy or pharmacologic therapy may be warranted, if clinically indicated. 6. There are calcifications of the aortic valve. Echocardiographic correlation for evaluation of potential valvular dysfunction may be warranted if clinically indicated. Aortic Atherosclerosis (ICD10-I70.0) and Emphysema (ICD10-J43.9). Electronically Signed   By: Vinnie Langton M.D.   On: 05/16/2020 07:07   MR 3D Recon At Scanner  Result Date: 04/28/2020 CLINICAL DATA:  Pancreatic cystic lesion workup. History of lung cancer. EXAM: MRI ABDOMEN WITHOUT AND WITH CONTRAST (INCLUDING  MRCP) TECHNIQUE: Multiplanar multisequence MR imaging of the abdomen was performed both before and after the administration of intravenous contrast. Heavily T2-weighted images of the biliary and pancreatic ducts were obtained, and three-dimensional MRCP images were rendered by post processing. CONTRAST:  87mL GADAVIST GADOBUTROL 1 MMOL/ML IV SOLN COMPARISON:  CT scan 11/13/2019. FINDINGS: Despite efforts by the technologist and patient, motion artifact is present on today's exam and could not be eliminated. This reduces exam sensitivity and specificity. Lower chest: Previously described pulmonary nodules are not well characterized on today's exam. Hepatobiliary: Multiple gallstones measuring up to 2.2 cm in diameter present in the gallbladder. No gallbladder wall thickening or pericholecystic fluid. No significant biliary dilatation. No compelling evidence of choledocholithiasis although the motion artifact interferes with assessment. No significant abnormal hepatic parenchymal lesion identified. Pancreas: Severe pancreatic atrophy with multi cystic dilatation of the dorsal pancreatic duct and multiple dilated side ducts. As such it is difficult to separate potential intraductal papillary mucinous neoplasm or cystic lesion from the rest of the saccular dilatation of dorsal pancreatic duct. The dominant cystic component is along the pancreatic tail and measures 4.7 cm perpendicular to the axis of the dorsal pancreatic duct,  stable. Patient has known calcifications along the severely atrophic pancreatic parenchyma. No internal nodularity or abnormal enhancement within or along the dorsal pancreatic duct. The questionable soft tissue nodule along the dorsal pancreatic duct on 11/13/2019 is not definitively seen on today's exam and could certainly simply represent a small amount of residual pancreatic parenchyma. Spleen:  Unremarkable Adrenals/Urinary Tract: 1.0 by 0.7 cm Bosniak category 2 complex but benign cyst of the  left mid kidney on image 37 series 3, without observed enhancement. Other Bosniak category 1 cysts are present bilaterally. No well-defined enhancing renal mass is observed. Small left adrenal adenoma. Stomach/Bowel: Scattered sigmoid colon diverticula. Vascular/Lymphatic: Aortoiliac atherosclerotic vascular disease. No pathologic adenopathy. Other:  No supplemental non-categorized findings. Musculoskeletal: Interbody fusion at L4-L5-S1. 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 IMPRESSION: 1. Severe pancreatic atrophy with multi cystic dilatation of the dorsal pancreatic duct and multiple dilated side ducts. The dominant cystic component is along the pancreatic tail and measures 4.7 cm perpendicular to the axis of the dorsal pancreatic duct. Patient has known calcifications along the dorsal pancreatic duct, but no internal nodularity or abnormal enhancement within or along the dorsal pancreatic duct. This is likely a combination of the severe chronic pancreatitis and side branch intraductal papillary mucinous neoplasm. Typically lesions of this size proceed to fine-needle aspiration. 2. Cholelithiasis without findings of choledocholithiasis. 3. Bosniak category 2 complex but benign cyst of the left mid kidney. 4. Small left adrenal adenoma. 5. Despite efforts by the technologist and patient, motion artifact is present on today's exam and could not be eliminated. This reduces exam sensitivity and specificity. 6. 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 7. Scattered sigmoid colon diverticula. 8. Previously described pulmonary nodules are not well characterized on today's exam. Electronically Signed   By: Van Clines M.D.   On: 04/28/2020 14:46   MR ABDOMEN MRCP W WO CONTAST  Result Date: 04/28/2020 CLINICAL DATA:  Pancreatic cystic lesion workup. History of lung cancer. EXAM: MRI ABDOMEN WITHOUT AND WITH CONTRAST (INCLUDING MRCP)  TECHNIQUE: Multiplanar multisequence MR imaging of the abdomen was performed both before and after the administration of intravenous contrast. Heavily T2-weighted images of the biliary and pancreatic ducts were obtained, and three-dimensional MRCP images were rendered by post processing. CONTRAST:  62mL GADAVIST GADOBUTROL 1 MMOL/ML IV SOLN COMPARISON:  CT scan 11/13/2019. FINDINGS: Despite efforts by the technologist and patient, motion artifact is present on today's exam and could not be eliminated. This reduces exam sensitivity and specificity. Lower chest: Previously described pulmonary nodules are not well characterized on today's exam. Hepatobiliary: Multiple gallstones measuring up to 2.2 cm in diameter present in the gallbladder. No gallbladder wall thickening or pericholecystic fluid. No significant biliary dilatation. No compelling evidence of choledocholithiasis although the motion artifact interferes with assessment. No significant abnormal hepatic parenchymal lesion identified. Pancreas: Severe pancreatic atrophy with multi cystic dilatation of the dorsal pancreatic duct and multiple dilated side ducts. As such it is difficult to separate potential intraductal papillary mucinous neoplasm or cystic lesion from the rest of the saccular dilatation of dorsal pancreatic duct. The dominant cystic component is along the pancreatic tail and measures 4.7 cm perpendicular to the axis of the dorsal pancreatic duct, stable. Patient has known calcifications along the severely atrophic pancreatic parenchyma. No internal nodularity or abnormal enhancement within or along the dorsal pancreatic duct. The questionable soft tissue nodule along the dorsal pancreatic duct on 11/13/2019 is not definitively seen on today's exam and could certainly simply  represent a small amount of residual pancreatic parenchyma. Spleen:  Unremarkable Adrenals/Urinary Tract: 1.0 by 0.7 cm Bosniak category 2 complex but benign cyst of the left  mid kidney on image 37 series 3, without observed enhancement. Other Bosniak category 1 cysts are present bilaterally. No well-defined enhancing renal mass is observed. Small left adrenal adenoma. Stomach/Bowel: Scattered sigmoid colon diverticula. Vascular/Lymphatic: Aortoiliac atherosclerotic vascular disease. No pathologic adenopathy. Other:  No supplemental non-categorized findings. Musculoskeletal: Interbody fusion at L4-L5-S1. 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 IMPRESSION: 1. Severe pancreatic atrophy with multi cystic dilatation of the dorsal pancreatic duct and multiple dilated side ducts. The dominant cystic component is along the pancreatic tail and measures 4.7 cm perpendicular to the axis of the dorsal pancreatic duct. Patient has known calcifications along the dorsal pancreatic duct, but no internal nodularity or abnormal enhancement within or along the dorsal pancreatic duct. This is likely a combination of the severe chronic pancreatitis and side branch intraductal papillary mucinous neoplasm. Typically lesions of this size proceed to fine-needle aspiration. 2. Cholelithiasis without findings of choledocholithiasis. 3. Bosniak category 2 complex but benign cyst of the left mid kidney. 4. Small left adrenal adenoma. 5. Despite efforts by the technologist and patient, motion artifact is present on today's exam and could not be eliminated. This reduces exam sensitivity and specificity. 6. 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 7. Scattered sigmoid colon diverticula. 8. Previously described pulmonary nodules are not well characterized on today's exam. Electronically Signed   By: Van Clines M.D.   On: 04/28/2020 14:46    No images are attached to the encounter.   CMP Latest Ref Rng & Units 04/09/2020  Glucose 65 - 99 mg/dL 192(H)  BUN 8 - 27 mg/dL 17  Creatinine 0.57 - 1.00 mg/dL 0.93  Sodium 134 - 144 mmol/L  146(H)  Potassium 3.5 - 5.2 mmol/L 4.1  Chloride 96 - 106 mmol/L 105  CO2 20 - 29 mmol/L 24  Calcium 8.7 - 10.3 mg/dL 9.5  Total Protein 6.0 - 8.5 g/dL 6.4  Total Bilirubin 0.0 - 1.2 mg/dL 0.3  Alkaline Phos 44 - 121 IU/L 101  AST 0 - 40 IU/L 18  ALT 0 - 32 IU/L 14   CBC Latest Ref Rng & Units 04/09/2020  WBC 3.4 - 10.8 x10E3/uL 8.3  Hemoglobin 11.1 - 15.9 g/dL 12.9  Hematocrit 34.0 - 46.6 % 40.4  Platelets 150 - 450 x10E3/uL 227     Assessment and plan: Patient is a 80 year old female with history of Stage I lung cancer involving the left upper lobe s/p SBRT here to discuss CT scan results and further management.    Patient has undergone recent CT in March 2022 which showed no evidence of recurrence or progressive disease.  Overall stable findings noted in the left upper lobe.  Subcentimeter pulmonary nodules also stable.  I will continue with CT surveillance every 6 months.  Patient is also seen Dr. Vicente Males and underwent MRI abdomen which showed multiple dilated ducts and the dominant cystic component was 4.7 cm.  She will discuss this further with him in the upcoming appointment regarding surveillance versus FNA  Follow-up instructions: CT in 6 months followed by in person visit  I discussed the assessment and treatment plan with the patient. The patient was provided an opportunity to ask questions and all were answered. The patient agreed with the plan and demonstrated an understanding of the instructions.   The patient was advised to call  back or seek an in-person evaluation if the symptoms worsen or if the condition fails to improve as anticipated.  I provided 15 minutes of non face-to-face telephone visit time during this encounter, and > 50% was spent counseling as documented under my assessment & plan.  Visit Diagnosis: 1. Malignant neoplasm of upper lobe of left lung ([place])     Dr. Randa Evens, MD, MPH [facility] at [facility] Tel-  [phone] 05/19/2020 3:44 PM

## 2020-05-19 NOTE — Telephone Encounter (Signed)
Pt has virtual appt 05/20/20 Please review and send to pharmacy if possible

## 2020-05-19 NOTE — Progress Notes (Signed)
Patient  Contacted for virtual visit she reports that she is having signs and symptoms of a UTI she has contacted her PCP.

## 2020-05-20 ENCOUNTER — Telehealth: Payer: Self-pay | Admitting: Gastroenterology

## 2020-05-20 ENCOUNTER — Encounter: Payer: Self-pay | Admitting: Physician Assistant

## 2020-05-20 ENCOUNTER — Ambulatory Visit (INDEPENDENT_AMBULATORY_CARE_PROVIDER_SITE_OTHER): Payer: Medicare Other | Admitting: Physician Assistant

## 2020-05-20 DIAGNOSIS — R300 Dysuria: Secondary | ICD-10-CM | POA: Diagnosis not present

## 2020-05-20 DIAGNOSIS — K8689 Other specified diseases of pancreas: Secondary | ICD-10-CM

## 2020-05-20 DIAGNOSIS — T466X5A Adverse effect of antihyperlipidemic and antiarteriosclerotic drugs, initial encounter: Secondary | ICD-10-CM

## 2020-05-20 DIAGNOSIS — F411 Generalized anxiety disorder: Secondary | ICD-10-CM | POA: Diagnosis not present

## 2020-05-20 DIAGNOSIS — G894 Chronic pain syndrome: Secondary | ICD-10-CM | POA: Diagnosis not present

## 2020-05-20 DIAGNOSIS — E1165 Type 2 diabetes mellitus with hyperglycemia: Secondary | ICD-10-CM

## 2020-05-20 DIAGNOSIS — M6282 Rhabdomyolysis: Secondary | ICD-10-CM | POA: Diagnosis not present

## 2020-05-20 DIAGNOSIS — Z9981 Dependence on supplemental oxygen: Secondary | ICD-10-CM | POA: Diagnosis not present

## 2020-05-20 DIAGNOSIS — C3412 Malignant neoplasm of upper lobe, left bronchus or lung: Secondary | ICD-10-CM | POA: Diagnosis not present

## 2020-05-20 MED ORDER — HYDROCODONE-ACETAMINOPHEN 5-325 MG PO TABS: ORAL_TABLET | ORAL | 0 refills | Status: DC

## 2020-05-20 NOTE — Telephone Encounter (Signed)
Can you schedule patient for a video visit to discuss MRI findings? per Dr. Vicente Males

## 2020-05-20 NOTE — Progress Notes (Signed)
[facility] [place], [place] [address]  Internal MEDICINE  Telephone Visit  Patient Name: Lynn Robbins  [id number]  [id number]  Date of Service: 05/20/2020  I connected with the patient at 11:25 by telephone and verified the patients identity using two identifiers.   I discussed the limitations, risks, security and privacy concerns of performing an evaluation and management service by telephone and the availability of in person appointments. I also discussed with the patient that there may be a patient responsible charge related to the service.  The patient expressed understanding and agrees to proceed.    Chief Complaint  Patient presents with  . Telephone Assessment  . Telephone Screen    LINE 1....Marland Kitchen phone....[phone]  . Pain    4 month fup    HPI Pt is here today for 4 month f/u. -Sent cipro yesterday for a UTI but it is still waiting for it to be delivered. 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 New CT chest was done and she was told it looked good and would be seen again in 6 months for f/u -MRI-showed pancreatic cyst--no intervention can be done bc she is not a surgical candidate. States she has a f/u on the 20th to review this further with Dr. Vicente Males.  MRI abdomen MRCP 04/28/20 IMPRESSION: 1. Severe pancreatic atrophy with multi cystic dilatation of the dorsal pancreatic duct and multiple dilated side ducts. The dominant cystic component is along the pancreatic tail and measures 4.7 cm perpendicular to the axis of the dorsal pancreatic  duct. Patient has known calcifications along the dorsal pancreatic duct, but no internal nodularity or abnormal enhancement within or along the dorsal pancreatic duct. This is likely a combination of the severe chronic pancreatitis and side branch intraductal papillary mucinous neoplasm. Typically lesions of this size proceed to fine-needle aspiration. 2. Cholelithiasis without findings of choledocholithiasis. 3. Bosniak category 2 complex but benign cyst of the left mid kidney. 4. Small left adrenal adenoma. 5. Despite efforts by the technologist and patient, motion artifact is present on today's exam and could not be eliminated. This reduces exam sensitivity and specificity. 6. 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 7. Scattered sigmoid colon diverticula. 8. Previously described pulmonary nodules are not well characterized on today's exam.  CT chest 05/16/20 IMPRESSION: 1. Treated left upper lobe pulmonary nodules grossly stable compared to prior examinations with surrounding chronic postradiation changes. 2. Small 2-4 mm pulmonary nodules scattered elsewhere in the lungs bilaterally, stable compared to prior studies, considered benign. 3. Diffuse bronchial wall thickening with mild to moderate centrilobular and paraseptal emphysema; imaging findings suggestive of underlying COPD. 4. Cystic appearing lesions in the distal body and tail of the pancreas, grossly similar to prior examinations, better characterized on prior abdominal MRI 04/28/2020. 5. Aortic atherosclerosis, in addition to left main and 2 vessel coronary artery disease. Assessment for potential risk factor modification, dietary therapy or pharmacologic therapy may be warranted, if clinically indicated. 6. There are calcifications of the aortic valve. Echocardiographic correlation for evaluation  of potential valvular dysfunction may be warranted if clinically  indicated.  Aortic Atherosclerosis (ICD10-I70.0) and Emphysema (ICD10-J43.9).   Current Medication: Outpatient Encounter Medications as of 05/20/2020  Medication Sig  . Accu-Chek FastClix Lancets MISC Use as directed to check sugars. DX E11.65  . albuterol (PROAIR HFA) 108 (90 Base) MCG/ACT inhaler TAKE 2 PUFFS BY MOUTH EVERY 4 HOURS AS NEEDED  . atenolol (TENORMIN) 25 MG tablet Take 1 tablet (25 mg total) by mouth daily.  . ciprofloxacin (CIPRO) 500 MG tablet Take 1 tablet (500 mg total) by mouth 2 (two) times daily.  . clonazePAM (KLONOPIN) 1 MG tablet TAKE ONE TAB BY MOUTH TWICE DAILY FOR ANXIETY  . glimepiride (AMARYL) 1 MG tablet Take one tab dialy with breakfast  . losartan-hydrochlorothiazide (HYZAAR) 50-12.5 MG tablet Take 1 tablet by mouth daily.  . OXYGEN Inhale into the lungs.  . pantoprazole (PROTONIX) 40 MG tablet Take 1 tablet (40 mg total) by mouth daily.  . SitaGLIPtin-MetFORMIN HCl (JANUMET XR) 50-500 MG TB24 TAKE ONE TABLET EVERY DAY WITH FOOD FOR DIABETES  . tiotropium (SPIRIVA HANDIHALER) 18 MCG inhalation capsule INHALE 1 CAPSULE VIA HANDIHALER ONCE DAILY AT THE SAME TIME EVERY DAY  . [DISCONTINUED] HYDROcodone-acetaminophen (NORCO/VICODIN) 5-325 MG tablet Take one tablet by mouth twice daily as needed for severe pain.  Marland Kitchen HYDROcodone-acetaminophen (NORCO/VICODIN) 5-325 MG tablet Take one tablet by mouth twice daily as needed for severe pain.   No facility-administered encounter medications on file as of 05/20/2020.    Surgical History: Past Surgical History:  Procedure Laterality Date  . CESAREAN SECTION      Medical History: Past Medical History:  Diagnosis Date  . Cancer ([place])   . CHF (congestive heart failure) ([place])   . COPD (chronic obstructive pulmonary disease) ([place])   . Depression   . Diabetes mellitus without complication ([place])   . HOH (hard of hearing)   . Hypertension     Family History: Family History  Problem Relation Age of Onset  .  Diabetes Father   . Colon cancer Father   . Colon cancer Mother   . Aneurysm Mother 72       brain  . Lung cancer Sister   . Breast cancer Sister   . Lung cancer Brother   . Lung cancer Sister   . Bone cancer Brother   . Lung cancer Brother   . Cancer Sister        type unknown    Social History   Socioeconomic History  . Marital status: Single    Spouse name: Not on file  . Number of children: 5  . Years of education: Not on file  . Highest education level: Not on file  Occupational History  . Not on file  Tobacco Use  . Smoking status: Former Smoker    Packs/day: 3.00    Years: 57.00    Pack years: 171.00    Types: Cigarettes    Quit date: 04/17/1998    Years since quitting: 22.1  . Smokeless tobacco: Former Systems developer    Types: Snuff  Vaping Use  . Vaping Use: Never used  Substance and Sexual Activity  . Alcohol use: No  . Drug use: No  . Sexual activity: Never  Other Topics Concern  . Not on file  Social History Narrative   ** Merged History Encounter **       Social Determinants of Health   Financial Resource Strain: Not on file  Food Insecurity: Not on file  Transportation Needs: Not on file  Physical Activity: Not on file  Stress: Not on file  Social Connections: Not on file  Intimate Partner Violence: Not on file      Review of Systems  Constitutional: Negative for chills and unexpected weight change.  HENT: Negative for congestion, postnasal drip, rhinorrhea, sneezing and sore throat.   Eyes: Negative for redness.  Respiratory: Positive for shortness of breath. Negative for cough and chest tightness.   Cardiovascular: Negative for chest pain and palpitations.  Gastrointestinal: Negative for abdominal pain, constipation, diarrhea, nausea and vomiting.  Genitourinary: Positive for dysuria and frequency.  Musculoskeletal: Positive for arthralgias and back pain. Negative for joint swelling and neck pain.  Skin: Negative for rash.  Neurological:  Negative.  Negative for tremors and numbness.  Hematological: Negative for adenopathy. Does not bruise/bleed easily.  Psychiatric/Behavioral: Negative for behavioral problems (Depression), sleep disturbance and suicidal ideas. The patient is nervous/anxious.     Vital Signs: Resp 16   Ht 5\' 2"  (1.575 m)   Wt 170 lb (77.1 kg)   BMI 31.09 kg/m    Observation/Objective:  Pt able to carry out conversation.  Assessment/Plan: 1. Type 2 diabetes mellitus with hyperglycemia, without long-term current use of insulin (HCC) Pt will continue on current medication--Amaryl decreased to 1mg  po qd. Will need A1c next visit.  2. Malignant neoplasm of upper lobe of left lung [facility]) Per oncology s/p SBR. Recent Chest Xray stable--will f/u with them again for repeat in 65months  3. Generalized anxiety disorder Stable, may continue klonopin as needed  4. Chronic pain disorder Pain is still problematic at times. May continue hydrocodone-acetaminophen 1 tab BID for severe pain  5. Pancreatic mass MRI showed pancreatic cyst--per GI not intervention since pt not a surgical candidate she follows up with GI on the 20th to discuss further  6. 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 Dysuria Pt thinks she has UTI--Cipro was sent and awaiting delivery today   General Counseling: terika pillard understanding of the findings of today's phone visit and agrees with plan of treatment. I have discussed any further diagnostic evaluation that may be needed or ordered today. We also reviewed her medications today. she has been encouraged to call the office with any questions or concerns that should arise related to todays visit.    No orders of the defined types were placed in this encounter.   Meds ordered this encounter  Medications  . HYDROcodone-acetaminophen (NORCO/VICODIN) 5-325 MG tablet    Sig: Take  one tablet by mouth twice daily as needed for severe pain.    Dispense:  60 tablet    Refill:  0    Time spent:30 Minutes    Dr [name] Internal medicine

## 2020-05-23 NOTE — Telephone Encounter (Signed)
Sent to lauren

## 2020-06-04 ENCOUNTER — Other Ambulatory Visit: Payer: Self-pay

## 2020-06-04 ENCOUNTER — Ambulatory Visit (INDEPENDENT_AMBULATORY_CARE_PROVIDER_SITE_OTHER): Payer: Medicare Other | Admitting: Gastroenterology

## 2020-06-04 ENCOUNTER — Encounter: Payer: Self-pay | Admitting: Gastroenterology

## 2020-06-04 VITALS — BP 112/62 | HR 83 | Ht 62.0 in | Wt 168.4 lb

## 2020-06-04 DIAGNOSIS — K862 Cyst of pancreas: Secondary | ICD-10-CM

## 2020-06-04 NOTE — Progress Notes (Signed)
Jonathon Bellows MD, MRCP(U.K) [address]  [place]  [place], [place] [address]  Main: [phone]  Fax: [phone]   Primary Care Physician: [name], MD  Primary Gastroenterologist:  Dr. Jonathon Bellows   Follow up to discuss results   HPI: Lynn Robbins is a 80 y.o. female    Summary of history :  She follows with Dr. Janese Banks stage I lung cancer of the left upper lobe.  She has history of COPD on home oxygen, hypertension, CHF.  ER visit in February 2020 with chest pain which led to the CT chest and abdomen which showed a spiculated mass concerning for lung cancer.  Cystic mass has been noted in the CT abdomen and pelvis with contrast in the pancreatic tail which is 4.6 x 3.3 cm and appears increased in size compared to prior exam with diffuse ductal dilation of the main pancreatic duct to the level of the pancreatic head with calcifications.  She would not be a candidate for Whipple surgery.  CMP when checked in September 2021 showed no abnormal LFTs. She is on oxygen long-term and came into the office with a concentrator.    Interval history     04/28/2020: MR abdomen MRCP with and without contrast shows severe pancreatic atrophy with multicystic dilation of the dorsal pancreatic duct and multiple dilated side ducts.  The dominant cystic component along the pancreatic tail measures 4.7 cm.. Patient hasknown calcifications along the dorsal pancreatic duct, but no internal nodularity or abnormal enhancement within or along the dorsal pancreatic duct. This is likely a combination of the severe chronic pancreatitis and side branch intraductal papillary mucinous neoplasm. Typically lesions of this size proceed to fine-needle aspiration.  No new complaints discussed results of the MRI.   Current Outpatient Medications  Medication Sig Dispense Refill  . Accu-Chek FastClix Lancets MISC Use as directed to check sugars. DX E11.65 306 each 3  . albuterol (PROAIR HFA) 108 (90 Base)  MCG/ACT inhaler TAKE 2 PUFFS BY MOUTH EVERY 4 HOURS AS NEEDED 54 g 4  . atenolol (TENORMIN) 25 MG tablet Take 1 tablet (25 mg total) by mouth daily. 90 tablet 3  . clonazePAM (KLONOPIN) 1 MG tablet TAKE ONE TAB BY MOUTH TWICE DAILY FOR ANXIETY 60 tablet 3  . glimepiride (AMARYL) 1 MG tablet Take one tab dialy with breakfast 90 tablet 3  . HYDROcodone-acetaminophen (NORCO/VICODIN) 5-325 MG tablet Take one tablet by mouth twice daily as needed for severe pain. 60 tablet 0  . losartan-hydrochlorothiazide (HYZAAR) 50-12.5 MG tablet Take 1 tablet by mouth daily. 90 tablet 3  . OXYGEN Inhale into the lungs.    . pantoprazole (PROTONIX) 40 MG tablet Take 1 tablet (40 mg total) by mouth daily. 90 tablet 3  . SitaGLIPtin-MetFORMIN HCl (JANUMET XR) 50-500 MG TB24 TAKE ONE TABLET EVERY DAY WITH FOOD FOR DIABETES 90 tablet 3  . tiotropium (SPIRIVA HANDIHALER) 18 MCG inhalation capsule INHALE 1 CAPSULE VIA HANDIHALER ONCE DAILY AT THE SAME TIME EVERY DAY 90 capsule 3  . ciprofloxacin (CIPRO) 500 MG tablet Take 1 tablet (500 mg total) by mouth 2 (two) times daily. (Patient not taking: Reported on 06/04/2020) 20 tablet 0   No current facility-administered medications for this visit.    Allergies as of 06/04/2020  . (No Known Allergies)    ROS:  General: Negative for anorexia, weight loss, fever, chills, fatigue, weakness. ENT: Negative for hoarseness, difficulty swallowing , nasal congestion. CV: Negative for chest pain, angina, palpitations,  dyspnea on exertion, peripheral edema.  Respiratory: Negative for dyspnea at rest, dyspnea on exertion, cough, sputum, wheezing.  GI: See history of present illness. GU:  Negative for dysuria, hematuria, urinary incontinence, urinary frequency, nocturnal urination.  Endo: Negative for unusual weight change.    Physical Examination:   BP 112/62 (BP Location: Right Wrist, Patient Position: Sitting, Cuff Size: Large)   Pulse 83   Ht 5\' 2"  (1.575 m)   Wt 168 lb  6.4 oz (76.4 kg)   BMI 30.80 kg/m   General: Well-nourished, well-developed in no acute distress.  She is on oxygen 24 hours a day Eyes: No icterus. Conjunctivae pink. Neuro: Alert and oriented x 3.  Grossly intact. Skin: Warm and dry, no jaundice.   Psych: Alert and cooperative, normal mood and affect.   Imaging Studies: CT Chest Wo Contrast  Result Date: 05/16/2020 CLINICAL DATA:  80 year old female with history of COPD. Chest wall pain. Prior history of lung cancer. Follow-up study. EXAM: CT CHEST WITHOUT CONTRAST TECHNIQUE: Multidetector CT imaging of the chest was performed following the standard protocol without IV contrast. COMPARISON:  Multiple priors, most recently 11/13/2019. FINDINGS: Cardiovascular: Heart size is normal. There is no significant pericardial fluid, thickening or pericardial calcification. There is aortic atherosclerosis, as well as atherosclerosis of the great vessels of the mediastinum and the coronary arteries, including calcified atherosclerotic plaque in the left main, left anterior descending and right coronary arteries. Calcifications of the aortic valve. Mediastinum/Nodes: No pathologically enlarged mediastinal or hilar lymph nodes. Please note that accurate exclusion of hilar adenopathy is limited on noncontrast CT scans. Esophagus is unremarkable in appearance. No axillary lymphadenopathy. Lungs/Pleura: Left upper lobe pulmonary nodulea (axial image 32 of series 3), similar to prior studies currently measuring approximately 1.9 x 1.5 cm with surrounding ground-glass attenuation, septal thickening and architectural distortion, which likely reflects chronic postradiation scarring. A few scattered 2-4 mm calcified and noncalcified pulmonary nodules are again noted in the lungs bilaterally, stable in size and number compared to prior studies, considered benign. No other new suspicious appearing pulmonary nodules or masses are noted. No acute consolidative airspace  disease. No pleural effusions. Diffuse bronchial wall thickening with mild to moderate centrilobular and paraseptal emphysema. Upper Abdomen: Aortic atherosclerosis. 1.6 x 1.2 cm low-attenuation (1 Hounsfield unit) left adrenal nodule, similar to prior studies, most compatible with a benign adenoma. Low-attenuation lesion associated with the distal body and tail of the pancreas (axial image 131 of series 2) measuring 4.5 x 3.2 cm, incompletely characterized on today's non-contrast CT examination, but similar to numerous prior studies, potentially a pancreatic pseudocyst or IPMN (intraductal papillary mucinous neoplasm). Additional smaller cystic appearing area in the distal tail of the pancreas measuring 2.0 x 1.8 cm (axial image 136 of series 2), also stable compared to prior examination. Musculoskeletal: There are no aggressive appearing lytic or blastic lesions noted in the visualized portions of the skeleton. IMPRESSION: 1. Treated left upper lobe pulmonary nodules grossly stable compared to prior examinations with surrounding chronic postradiation changes. 2. Small 2-4 mm pulmonary nodules scattered elsewhere in the lungs bilaterally, stable compared to prior studies, considered benign. 3. Diffuse bronchial wall thickening with mild to moderate centrilobular and paraseptal emphysema; imaging findings suggestive of underlying COPD. 4. Cystic appearing lesions in the distal body and tail of the pancreas, grossly similar to prior examinations, better characterized on prior abdominal MRI 04/28/2020. 5. Aortic atherosclerosis, in addition to left main and 2 vessel coronary artery disease. Assessment for potential risk factor modification,  dietary therapy or pharmacologic therapy may be warranted, if clinically indicated. 6. There are calcifications of the aortic valve. Echocardiographic correlation for evaluation of potential valvular dysfunction may be warranted if clinically indicated. Aortic Atherosclerosis  (ICD10-I70.0) and Emphysema (ICD10-J43.9). Electronically Signed   By: Vinnie Langton M.D.   On: 05/16/2020 07:07    Assessment and Plan:   Lynn Robbins is a 80 y.o. y/o female here to follow up  of cystic mass incidentally found on CT abdomen performed during the evaluation of a spiculated mass of the lung.  She follows with oncology and carries a diagnosis of stage I lung cancer.  She is on home oxygen for COPD along with CHF.  Mass seen in the pancreas in September 2021 shows increase in size of 4.6 cm lesion in the pancreatic tail with marked diffuse dilation of the main pancreatic duct and possibly tiny intraductal soft tissue nodule.  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  Per oncology she is not a candidate for Whipple's surgery .   I think even an EUS under anesthesia would be at very high risk.  Had a long discussion with daughter, explained that even based on the EUS results it probably would not change management as she would not be a candidate for such as.  Considering the overall overall options and weighing the risks versus benefits probably better not to do anything further.  Daughter and patient have agreed.  Obviously if something changes in the future this can be reassessed.  I have discussed the plan with Dr Janese Banks   Dr Jonathon Bellows  MD,MRCP [facility]) Follow up in as needed

## 2020-06-05 DIAGNOSIS — J449 Chronic obstructive pulmonary disease, unspecified: Secondary | ICD-10-CM | POA: Diagnosis not present

## 2020-06-20 ENCOUNTER — Other Ambulatory Visit: Payer: Self-pay | Admitting: Physician Assistant

## 2020-06-20 DIAGNOSIS — G894 Chronic pain syndrome: Secondary | ICD-10-CM

## 2020-06-20 NOTE — Telephone Encounter (Signed)
Please review and fill if appropriate LOV: 05/20/20 NOV: 08/20/20

## 2020-07-03 ENCOUNTER — Other Ambulatory Visit: Payer: Self-pay

## 2020-07-03 ENCOUNTER — Telehealth: Payer: Self-pay

## 2020-07-03 DIAGNOSIS — J4520 Mild intermittent asthma, uncomplicated: Secondary | ICD-10-CM

## 2020-07-03 MED ORDER — ALBUTEROL SULFATE HFA 108 (90 BASE) MCG/ACT IN AERS: INHALATION_SPRAY | RESPIRATORY_TRACT | 4 refills | Status: DC

## 2020-07-03 NOTE — Telephone Encounter (Signed)
Please disregard pt already has refills left at pharmacy

## 2020-07-05 DIAGNOSIS — J449 Chronic obstructive pulmonary disease, unspecified: Secondary | ICD-10-CM | POA: Diagnosis not present

## 2020-07-22 ENCOUNTER — Other Ambulatory Visit: Payer: Self-pay | Admitting: Internal Medicine

## 2020-07-22 DIAGNOSIS — G894 Chronic pain syndrome: Secondary | ICD-10-CM

## 2020-07-23 ENCOUNTER — Telehealth: Payer: Self-pay | Admitting: Internal Medicine

## 2020-07-23 NOTE — Chronic Care Management (AMB) (Signed)
  Chronic Care Management   Outreach Note  07/23/2020 Name: DHALIA ZINGARO MRN: [id number] DOB: 12-03-1940  Referred by: [name], MD Reason for referral : No chief complaint on file.   An unsuccessful telephone outreach was attempted today. The patient was referred to the pharmacist for assistance with care management and care coordination.   Follow Up Plan:   Tatjana Dellinger Upstream Scheduler

## 2020-08-05 DIAGNOSIS — J449 Chronic obstructive pulmonary disease, unspecified: Secondary | ICD-10-CM | POA: Diagnosis not present

## 2020-08-14 ENCOUNTER — Telehealth: Payer: Self-pay | Admitting: Internal Medicine

## 2020-08-14 NOTE — Chronic Care Management (AMB) (Signed)
  Chronic Care Management   Note  08/14/2020 Name: Lynn Robbins MRN: [id number] DOB: [birth year]  Lynn Robbins is a 80 y.o. year old female who is a primary care patient of [name], MD. I reached out to [name] by phone today in response to a referral sent by [name]'s PCP, [name], MD.   [name] was given information about Chronic Care Management services today including:  CCM service includes personalized support from designated clinical staff supervised by her physician, including individualized plan of care and coordination with other care providers 24/7 contact phone numbers for assistance for urgent and routine care needs. Service will only be billed when office clinical staff spend 20 minutes or more in a month to coordinate care. Only one practitioner may furnish and bill the service in a calendar month. The patient may stop CCM services at any time (effective at the end of the month) by phone call to the office staff.   Patient agreed to services and verbal consent obtained.   Follow up plan:   [name]

## 2020-08-20 ENCOUNTER — Other Ambulatory Visit: Payer: Self-pay

## 2020-08-20 ENCOUNTER — Ambulatory Visit (INDEPENDENT_AMBULATORY_CARE_PROVIDER_SITE_OTHER): Payer: Medicare Other | Admitting: Nurse Practitioner

## 2020-08-20 ENCOUNTER — Encounter: Payer: Self-pay | Admitting: Nurse Practitioner

## 2020-08-20 VITALS — BP 130/75 | HR 74 | Temp 98.8°F | Resp 16 | Ht 64.0 in | Wt 162.4 lb

## 2020-08-20 DIAGNOSIS — G894 Chronic pain syndrome: Secondary | ICD-10-CM

## 2020-08-20 DIAGNOSIS — B373 Candidiasis of vulva and vagina: Secondary | ICD-10-CM

## 2020-08-20 DIAGNOSIS — I10 Essential (primary) hypertension: Secondary | ICD-10-CM | POA: Diagnosis not present

## 2020-08-20 DIAGNOSIS — R112 Nausea with vomiting, unspecified: Secondary | ICD-10-CM

## 2020-08-20 DIAGNOSIS — E1165 Type 2 diabetes mellitus with hyperglycemia: Secondary | ICD-10-CM | POA: Diagnosis not present

## 2020-08-20 DIAGNOSIS — F411 Generalized anxiety disorder: Secondary | ICD-10-CM | POA: Diagnosis not present

## 2020-08-20 DIAGNOSIS — B3731 Acute candidiasis of vulva and vagina: Secondary | ICD-10-CM

## 2020-08-20 LAB — POCT GLYCOSYLATED HEMOGLOBIN (HGB A1C): Hemoglobin A1C: 6 % — AB (ref 4.0–5.6)

## 2020-08-20 MED ORDER — ONDANSETRON 4 MG PO TBDP
4.0000 mg | ORAL_TABLET | Freq: Three times a day (TID) | ORAL | 0 refills | Status: DC | PRN
Start: 2020-08-20 — End: 2021-07-22

## 2020-08-20 MED ORDER — FLUCONAZOLE 150 MG PO TABS: 150.0000 mg | ORAL_TABLET | Freq: Once | ORAL | 0 refills | Status: AC

## 2020-08-20 MED ORDER — HYDROCODONE-ACETAMINOPHEN 5-325 MG PO TABS: 1.0000 | ORAL_TABLET | Freq: Two times a day (BID) | ORAL | 0 refills | Status: DC | PRN

## 2020-08-20 MED ORDER — ATENOLOL 25 MG PO TABS: 25.0000 mg | ORAL_TABLET | Freq: Every day | ORAL | 3 refills | Status: DC

## 2020-08-20 MED ORDER — CLONAZEPAM 1 MG PO TABS: ORAL_TABLET | ORAL | 3 refills | Status: DC

## 2020-08-20 MED ORDER — JANUMET XR 50-500 MG PO TB24: ORAL_TABLET | ORAL | 3 refills | Status: DC

## 2020-08-20 NOTE — Progress Notes (Signed)
[facility] [place], [place] [address]  Internal MEDICINE  Office Visit Note  Patient Name: Lynn Robbins  [id number]  [id number]  Date of Service: 08/20/2020  Chief Complaint  Patient presents with   Hypertension   Depression   Diabetes   Hearing Problem    HPI Lynn Robbins presents for a follow up visit for diabetes and hypertension and is accompanied by her daughter. Her A1C is 6.0 today. Her A1C in December 2021 was 5.7 . She is requesting medication refills today.  -requesting medication for nausea and vomiting.  Has a vaginal yeast infection and would like to have something to treat that.  Her son who lives with her: Elta Guadeloupe- [phone]   Current Medication: Outpatient Encounter Medications as of 08/20/2020  Medication Sig   Accu-Chek FastClix Lancets MISC Use as directed to check sugars. DX E11.65   albuterol (PROAIR HFA) 108 (90 Base) MCG/ACT inhaler TAKE 2 PUFFS BY MOUTH EVERY 4 HOURS AS NEEDED   ciprofloxacin (CIPRO) 500 MG tablet Take 1 tablet (500 mg total) by mouth 2 (two) times daily.   [EXPIRED] fluconazole (DIFLUCAN) 150 MG tablet Take 1 tablet (150 mg total) by mouth once for 1 dose. May take 1 additional dose in 3 days if still having symptoms.   glimepiride (AMARYL) 1 MG tablet Take one tab dialy with breakfast   losartan-hydrochlorothiazide (HYZAAR) 50-12.5 MG tablet Take 1 tablet by mouth daily.   ondansetron (ZOFRAN-ODT) 4 MG disintegrating tablet Take 1 tablet (4 mg total) by mouth every 8 (eight) hours as needed for nausea or vomiting.   OXYGEN Inhale into the lungs.   tiotropium (SPIRIVA HANDIHALER) 18 MCG inhalation capsule INHALE 1 CAPSULE VIA HANDIHALER ONCE DAILY AT THE SAME TIME EVERY DAY   [DISCONTINUED] atenolol (TENORMIN) 25 MG tablet Take 1 tablet (25 mg total) by mouth daily.   [DISCONTINUED] clonazePAM (KLONOPIN) 1 MG tablet TAKE ONE TAB BY MOUTH TWICE DAILY FOR ANXIETY   [DISCONTINUED] HYDROcodone-acetaminophen  (NORCO/VICODIN) 5-325 MG tablet TAKE 1 TABLET BY MOUTH TWICE DAILY AS NEEDED FOR SEVERE PAIN   [DISCONTINUED] SitaGLIPtin-MetFORMIN HCl (JANUMET XR) 50-500 MG TB24 TAKE ONE TABLET EVERY DAY WITH FOOD FOR DIABETES   atenolol (TENORMIN) 25 MG tablet Take 1 tablet (25 mg total) by mouth daily.   clonazePAM (KLONOPIN) 1 MG tablet TAKE ONE TAB BY MOUTH TWICE DAILY FOR ANXIETY   HYDROcodone-acetaminophen (NORCO/VICODIN) 5-325 MG tablet Take 1 tablet by mouth 2 (two) times daily as needed for moderate pain or severe pain.   pantoprazole (PROTONIX) 40 MG tablet Take 1 tablet (40 mg total) by mouth daily. (Patient not taking: Reported on 08/20/2020)   SitaGLIPtin-MetFORMIN HCl (JANUMET XR) 50-500 MG TB24 TAKE ONE TABLET EVERY DAY WITH FOOD FOR DIABETES   No facility-administered encounter medications on file as of 08/20/2020.    Surgical History: Past Surgical History:  Procedure Laterality Date   CESAREAN SECTION      Medical History: Past Medical History:  Diagnosis Date   Cancer ([place])    CHF (congestive heart failure) (HCC)    COPD (chronic obstructive pulmonary disease) ([place])    Depression    Diabetes mellitus without complication (HCC)    HOH (hard of hearing)    Hypertension     Family History: Family History  Problem Relation Age of Onset   Diabetes Father    Colon cancer Father    Colon cancer Mother    Aneurysm Mother 66       brain  Lung cancer Sister    Breast cancer Sister    Lung cancer Brother    Lung cancer Sister    Bone cancer Brother    Lung cancer Brother    Cancer Sister        type unknown    Social History   Socioeconomic History   Marital status: Single    Spouse name: Not on file   Number of children: 5   Years of education: Not on file   Highest education level: Not on file  Occupational History   Not on file  Tobacco Use   Smoking status: Former    Packs/day: 3.00    Years: 57.00    Pack years: 171.00    Types: Cigarettes    Quit date:  04/17/1998    Years since quitting: 22.3   Smokeless tobacco: Former    Types: Snuff  Vaping Use   Vaping Use: Never used  Substance and Sexual Activity   Alcohol use: No   Drug use: No   Sexual activity: Never  Other Topics Concern   Not on file  Social History Narrative   ** Merged History Encounter **       Social Determinants of Health   Financial Resource Strain: Not on file  Food Insecurity: Not on file  Transportation Needs: Not on file  Physical Activity: Not on file  Stress: Not on file  Social Connections: Not on file  Intimate Partner Violence: Not on file      Review of Systems  Constitutional:  Negative for chills, fatigue and unexpected weight change.  HENT:  Negative for congestion, rhinorrhea, sneezing and sore throat.   Eyes:  Negative for redness.  Respiratory:  Negative for cough, chest tightness and shortness of breath.   Cardiovascular:  Negative for chest pain and palpitations.  Gastrointestinal:  Negative for abdominal pain, constipation, diarrhea, nausea and vomiting.  Genitourinary:  Negative for dysuria and frequency.  Musculoskeletal:  Negative for arthralgias, back pain, joint swelling and neck pain.  Skin:  Negative for rash.  Neurological: Negative.  Negative for tremors and numbness.  Hematological:  Negative for adenopathy. Does not bruise/bleed easily.  Psychiatric/Behavioral:  Negative for behavioral problems (Depression), sleep disturbance and suicidal ideas. The patient is not nervous/anxious.    Vital Signs: BP 130/75   Pulse 74   Temp 98.8 F (37.1 C)   Resp 16   Ht 5\' 4"  (1.626 m)   Wt 162 lb 6.4 oz (73.7 kg)   SpO2 96%   BMI 27.88 kg/m    Physical Exam Vitals reviewed.  Constitutional:      General: She is not in acute distress.    Appearance: Normal appearance. She is well-developed. She is obese. She is not ill-appearing or diaphoretic.  HENT:     Head: Normocephalic and atraumatic.  Neck:     Thyroid: No  thyromegaly.     Vascular: No JVD.     Trachea: No tracheal deviation.  Cardiovascular:     Rate and Rhythm: Normal rate and regular rhythm.     Pulses: Normal pulses.     Heart sounds: Normal heart sounds. No murmur heard.   No friction rub. No gallop.  Pulmonary:     Effort: Pulmonary effort is normal. No respiratory distress.     Breath sounds: Normal breath sounds. No wheezing or rales.  Chest:     Chest wall: No tenderness.  Skin:    General: Skin is warm and dry.  Capillary Refill: Capillary refill takes less than 2 seconds.  Neurological:     Mental Status: She is alert and oriented to person, place, and time.  Psychiatric:        Mood and Affect: Mood normal.        Behavior: Behavior normal.  Assessment/Plan: 1. Type 2 diabetes mellitus with hyperglycemia, without long-term current use of insulin (HCC) , A1C checked today. It is 6.0,  which is up from 5.7  in December 20221. Refills ordered.  - POCT glycosylated hemoglobin (Hb A1C) - SitaGLIPtin-MetFORMIN HCl (JANUMET XR) 50-500 MG TB24; TAKE ONE TABLET EVERY DAY WITH FOOD FOR DIABETES  Dispense: 90 tablet; Refill: 3  2. Essential hypertension Stable with current medications, refill ordered.  - atenolol (TENORMIN) 25 MG tablet; Take 1 tablet (25 mg total) by mouth daily.  Dispense: 90 tablet; Refill: 3  3. Chronic pain disorder Takes hydrocodone for chronic pain, refill ordered.  - HYDROcodone-acetaminophen (NORCO/VICODIN) 5-325 MG tablet; Take 1 tablet by mouth 2 (two) times daily as needed for moderate pain or severe pain.  Dispense: 60 tablet; Refill: 0  4. Generalized anxiety disorder History of anxiety, refill ordered.  - clonazePAM (KLONOPIN) 1 MG tablet; TAKE ONE TAB BY MOUTH TWICE DAILY FOR ANXIETY  Dispense: 60 tablet; Refill: 3  5. Vaginal candidiasis Vaginal yeast infection, fluconazole ordered.  - fluconazole (DIFLUCAN) 150 MG tablet; Take 1 tablet (150 mg total) by mouth once for 1 dose. May take 1 additional dose in 3 days if still having symptoms.  Dispense: 2 tablet; Refill: 0  6. Non-intractable vomiting with nausea, unspecified vomiting type Takes zofran for nausea and vomiting, refill ordred.  - ondansetron (ZOFRAN-ODT) 4 MG disintegrating tablet; Take 1 tablet (4 mg total) by mouth every 8 (eight) hours as needed for nausea or vomiting.  Dispense: 90 tablet; Refill: 0   General Counseling: karah caruthers understanding of the findings of todays visit and agrees with plan of treatment. I have discussed any further diagnostic  evaluation that may be needed or ordered today. We also reviewed her medications today. she has been encouraged to call the office with any questions or concerns that should arise related to todays visit.    Orders Placed This Encounter  Procedures   POCT glycosylated hemoglobin (Hb A1C)    Meds ordered this encounter  Medications   SitaGLIPtin-MetFORMIN HCl (JANUMET XR) 50-500 MG TB24    Sig: TAKE ONE TABLET EVERY DAY WITH FOOD FOR DIABETES    Dispense:  90 tablet    Refill:  3   atenolol (TENORMIN) 25 MG tablet    Sig: Take 1 tablet (25 mg total) by mouth daily.    Dispense:  90 tablet    Refill:  3   HYDROcodone-acetaminophen (NORCO/VICODIN) 5-325 MG tablet    Sig: Take 1 tablet by mouth 2 (two) times daily as needed for moderate pain or severe pain.    Dispense:  60 tablet    Refill:  0   clonazePAM (KLONOPIN) 1 MG tablet    Sig: TAKE ONE TAB BY MOUTH TWICE DAILY FOR ANXIETY    Dispense:  60 tablet    Refill:  3   ondansetron (ZOFRAN-ODT) 4 MG disintegrating tablet    Sig: Take 1 tablet (4 mg total) by mouth every 8 (eight) hours as needed for nausea or vomiting.    Dispense:  90 tablet    Refill:  0   fluconazole (DIFLUCAN) 150 MG tablet    Sig: Take 1 tablet (150 mg total) by mouth once for 1 dose. May take 1 additional dose in 3 days if still having symptoms.    Dispense:  2 tablet    Refill:  0       Return in about 5 months (around 01/20/2021) for CPE, Luretha Eberly PCP.  Total time spent:30 Minutes Time spent includes review of chart, medications, test results, and follow up plan with the patient.   [place] Controlled Substance Database was reviewed by me.  This patient was seen by Jonetta Osgood, FNP-C in collaboration with Dr. Clayborn Bigness as a part of collaborative care agreement.   Skylar Priest R. Valetta Fuller, MSN, FNP-C Internal medicine

## 2020-09-04 DIAGNOSIS — J449 Chronic obstructive pulmonary disease, unspecified: Secondary | ICD-10-CM | POA: Diagnosis not present

## 2020-09-25 ENCOUNTER — Other Ambulatory Visit: Payer: Self-pay | Admitting: Nurse Practitioner

## 2020-09-25 ENCOUNTER — Other Ambulatory Visit: Payer: Self-pay

## 2020-09-25 DIAGNOSIS — G894 Chronic pain syndrome: Secondary | ICD-10-CM

## 2020-09-29 ENCOUNTER — Telehealth: Payer: Self-pay

## 2020-09-29 NOTE — Telephone Encounter (Signed)
Send today

## 2020-09-30 ENCOUNTER — Telehealth: Payer: Self-pay | Admitting: Pharmacist

## 2020-09-30 NOTE — Progress Notes (Addendum)
Chronic Care Management Pharmacy Assistant   Name: Lynn Robbins  MRN: [id number] DOB: [birth year]  Lynn Robbins is an 80 y.o. year old female who presents for his initial CCM visit with the clinical pharmacist.  Reason for Encounter: Chart Prep   Conditions to be addressed/monitored: HTN, COPD, HLD, Type 2 DM.  Primary concerns for visit include: COPD  Recent office visits:  08/20/20 Jonetta Osgood, NP. For follow-up. STARTED Fluconazole 150 mg 1 tablet for 3 days and Ondansetron 4 mg every 8 hours PRN. 05/20/20 Mylinda Latina, PA. For follow-up. Per note: Amaryl decreased to 1mg  po qd.  Recent consult visits:  06/04/20 Jonathon Bellows, MD. For cyst of pancreas. No medication changes.  4/4/222 (Video Visit) Oncology Sindy Guadeloupe, MD. For follow-up of left lung. No medication changes. 04/09/20 Joellyn Rued, MD. For pancreatic cyst. No medication changes   Hospital visits:  None in previous 6 months  Medication History: Glimepiride 1 mg 90 DS 07/22/20 Losartan-Hydrochlorothiazide 50-12.5 mg  90 DS 07/22/20 Sitagliptin-Metformin 50-500 mg 90 DS 08/20/20  Medications: Outpatient Encounter Medications as of 09/30/2020  Medication Sig   Accu-Chek FastClix Lancets MISC Use as directed to check sugars. DX E11.65   albuterol (PROAIR HFA) 108 (90 Base) MCG/ACT inhaler TAKE 2 PUFFS BY MOUTH EVERY 4 HOURS AS NEEDED   atenolol (TENORMIN) 25 MG tablet Take 1 tablet (25 mg total) by mouth daily.   ciprofloxacin (CIPRO) 500 MG tablet Take 1 tablet (500 mg total) by mouth 2 (two) times daily.   clonazePAM (KLONOPIN) 1 MG tablet TAKE ONE TAB BY MOUTH TWICE DAILY FOR ANXIETY   fluconazole (DIFLUCAN) 150 MG tablet Take by mouth.   glimepiride (AMARYL) 1 MG tablet Take one tab dialy with breakfast   HYDROcodone-acetaminophen (NORCO/VICODIN) 5-325 MG tablet TAKE ONE (1) TABLET BY MOUTH TWO TIMES PER DAY AS NEEDED FOR MODERATE OR SEVEREPAIN.   JANUMET 50-500 MG tablet Take 1 tablet by  mouth daily.   losartan-hydrochlorothiazide (HYZAAR) 50-12.5 MG tablet Take 1 tablet by mouth daily.   ondansetron (ZOFRAN-ODT) 4 MG disintegrating tablet Take 1 tablet (4 mg total) by mouth every 8 (eight) hours as needed for nausea or vomiting.   OXYGEN Inhale into the lungs.   pantoprazole (PROTONIX) 40 MG tablet Take 1 tablet (40 mg total) by mouth daily. (Patient not taking: Reported on 08/20/2020)   SitaGLIPtin-MetFORMIN HCl (JANUMET XR) 50-500 MG TB24 TAKE ONE TABLET EVERY DAY WITH FOOD FOR DIABETES   tiotropium (SPIRIVA HANDIHALER) 18 MCG inhalation capsule INHALE 1 CAPSULE VIA HANDIHALER ONCE DAILY AT THE SAME TIME EVERY DAY   No facility-administered encounter medications on file as of 09/30/2020.   Have you seen any other providers since your last visit? Patients son stated no.  Any changes in your medications or health? Patients son stated no.   Any side effects from any medications? Patients son stated no.  Do you have an symptoms or problems not managed by your medications? Patients son stated no.  Any concerns about your health right now? Patients son stated no.   Has your provider asked that you check blood pressure, blood sugar, or follow special diet at home? Patients son stated she checks her blood pressure and blood sugar daily. Patients son stated he does not eat much.  Do you get any type of exercise on a regular basis? Patients son stated she's does some sitting down exercises.   Can you think of a goal you would like to reach  for your health? Patients son stated better stability when walking.     Do you have any problems getting your medications? Patients son stated no.   Is there anything that you would like to discuss during the appointment? Patients son stated no.  Please bring medications and supplements to appointment, patient reminded of OTP appointment on  10/03/20 at 9 am.   [place], [place] Pharmacist  Assistant [phone]

## 2020-10-03 ENCOUNTER — Ambulatory Visit: Payer: Medicare Other | Admitting: Pharmacist

## 2020-10-03 ENCOUNTER — Telehealth: Payer: Self-pay | Admitting: Pharmacist

## 2020-10-03 DIAGNOSIS — E782 Mixed hyperlipidemia: Secondary | ICD-10-CM

## 2020-10-03 DIAGNOSIS — I10 Essential (primary) hypertension: Secondary | ICD-10-CM

## 2020-10-03 DIAGNOSIS — E1165 Type 2 diabetes mellitus with hyperglycemia: Secondary | ICD-10-CM

## 2020-10-03 DIAGNOSIS — F411 Generalized anxiety disorder: Secondary | ICD-10-CM

## 2020-10-03 DIAGNOSIS — J449 Chronic obstructive pulmonary disease, unspecified: Secondary | ICD-10-CM

## 2020-10-03 NOTE — Progress Notes (Signed)
Chronic Care Management Pharmacy Note  10/03/2020 Name:  Lynn Robbins MRN:  [id number] DOB:  05/26/1940  Summary: Initial visit with PharmD.  Patient has no concern with cost of meds.  Currently has ASCVD risk of 52.7% and not on statin with diabetes.  We discussed all of her meds and list is updated.  DM is well controlled, currently not checking BP.  Recommendations/Changes made from today's visit: RPM for BP monitoring Consider statin in patient with diabetes and elevated ASCVD risk Patient needs DM foot exam  Plan: FU 6 months   Subjective: Lynn Robbins is an 80 y.o. year old female who is a primary patient of Humphrey Rolls, Timoteo Gaul, MD.  The CCM team was consulted for assistance with disease management and care coordination needs.    Engaged with patient by telephone for initial visit in response to provider referral for pharmacy case management and/or care coordination services.   Consent to Services:  The patient was given the following information about Chronic Care Management services today, agreed to services, and gave verbal consent: 1. CCM service includes personalized support from designated clinical staff supervised by the primary care provider, including individualized plan of care and coordination with other care providers 2. 24/7 contact phone numbers for assistance for urgent and routine care needs. 3. Service will only be billed when office clinical staff spend 20 minutes or more in a month to coordinate care. 4. Only one practitioner may furnish and bill the service in a calendar month. 5.The patient may stop CCM services at any time (effective at the end of the month) by phone call to the office staff. 6. The patient will be responsible for cost sharing (co-pay) of up to 20% of the service fee (after annual deductible is met). Patient agreed to services and consent obtained.  Patient Care Team: [name], MD as PCP - General (Internal Medicine) Telford Nab, RN as  Registered Nurse Edythe Clarity, [facility] as Pharmacist (Pharmacist)  Recent office visits:  08/20/20 Jonetta Osgood, NP. For follow-up. STARTED Fluconazole 150 mg 1 tablet for 3 days and Ondansetron 4 mg every 8 hours PRN. 05/20/20 Mylinda Latina, PA. For follow-up. Per note: Amaryl decreased to 70m po qd.   Recent consult visits:  06/04/20 AJonathon Bellows MD. For cyst of pancreas. No medication changes.  4/4/222 (Video Visit) Oncology RSindy Guadeloupe MD. For follow-up of left lung. No medication changes. 04/09/20 GJoellyn Rued MD. For pancreatic cyst. No medication changes    Hospital visits:  None in previous 6 months   Medication History: Glimepiride 1 mg 90 DS 07/22/20 Losartan-Hydrochlorothiazide 50-12.5 mg  90 DS 07/22/20 Sitagliptin-Metformin 50-500 mg 90 DS 08/20/20   Objective:  Lab Results  Component Value Date   CREATININE 0.93 04/09/2020   BUN 17 04/09/2020   GFRNONAA 59 (L) 04/09/2020   GFRAA 68 04/09/2020   NA 146 (H) 04/09/2020   K 4.1 04/09/2020   CALCIUM 9.5 04/09/2020   CO2 24 04/09/2020   GLUCOSE 192 (H) 04/09/2020    Lab Results  Component Value Date/Time   HGBA1C 6.0 (A) 08/20/2020 11:22 AM   HGBA1C 5.7 (A) 01/17/2020 11:43 AM    Last diabetic Eye exam: No results found for: HMDIABEYEEXA  Last diabetic Foot exam: No results found for: HMDIABFOOTEX   Lab Results  Component Value Date   CHOL 171 12/23/2017   HDL 54 12/23/2017   LDLCALC 82 12/23/2017   TRIG 175 (H) 12/23/2017  Hepatic Function Latest Ref Rng & Units 04/09/2020 11/15/2019 04/23/2019  Total Protein 6.0 - 8.5 g/dL 6.4 6.8 7.3  Albumin 3.7 - 4.7 g/dL 3.7 3.5 3.7  AST 0 - 40 IU/L '18 18 18  ' ALT 0 - 32 IU/L '14 15 16  ' Alk Phosphatase 44 - 121 IU/L 101 63 79  Total Bilirubin 0.0 - 1.2 mg/dL 0.3 0.5 0.3    Lab Results  Component Value Date/Time   TSH 2.130 12/23/2017 12:10 PM   FREET4 1.04 12/23/2017 12:10 PM    CBC Latest Ref Rng & Units 04/09/2020 11/15/2019 04/23/2019  WBC 3.4  - 10.8 x10E3/uL 8.3 7.4 8.6  Hemoglobin 11.1 - 15.9 g/dL 12.9 11.4(L) 11.3(L)  Hematocrit 34.0 - 46.6 % 40.4 35.2(L) 38.2  Platelets 150 - 450 x10E3/uL 227 253 269    No results found for: VD25OH  Clinical ASCVD: No  The 10-year ASCVD risk score Mikey Bussing DC Jr., et al., 2013) is: 52.7%   Values used to calculate the score:     Age: 80 years     Sex: Female     Is Non-Hispanic African American: No     Diabetic: Yes     Tobacco smoker: No     Systolic Blood Pressure: 144 mmHg     Is BP treated: Yes     HDL Cholesterol: 54 mg/dL     Total Cholesterol: 171 mg/dL    Depression screen [facility] 2/9 05/20/2020 06/04/2019 03/06/2019  Decreased Interest 0 0 0  Down, Depressed, Hopeless 0 0 1  PHQ - 2 Score 0 0 1     Social History   Tobacco Use  Smoking Status Former   Packs/day: 3.00   Years: 57.00   Pack years: 171.00   Types: Cigarettes   Quit date: 04/17/1998   Years since quitting: 22.4  Smokeless Tobacco Former   Types: Snuff   BP Readings from Last 3 Encounters:  08/20/20 130/75  06/04/20 112/62  04/09/20 120/70   Pulse Readings from Last 3 Encounters:  08/20/20 74  06/04/20 83  04/09/20 83   Wt Readings from Last 3 Encounters:  08/20/20 162 lb 6.4 oz (73.7 kg)  06/04/20 168 lb 6.4 oz (76.4 kg)  05/20/20 170 lb (77.1 kg)   BMI Readings from Last 3 Encounters:  08/20/20 27.88 kg/m  06/04/20 30.80 kg/m  05/20/20 31.09 kg/m    Assessment/Interventions: Review of patient past medical history, allergies, medications, health status, including review of consultants reports, laboratory and other test data, was performed as part of comprehensive evaluation and provision of chronic care management services.   SDOH:  (Social Determinants of Health) assessments and interventions performed: Yes  Financial Resource Strain: Low Risk    Difficulty of Paying Living Expenses: Not very hard    SDOH Screenings   Alcohol Screen: Not on file  Depression (PHQ2-9): Low Risk     PHQ-2 Score: 0  Financial Resource Strain: Low Risk    Difficulty of Paying Living Expenses: Not very hard  Food Insecurity: Not on file  Housing: Not on file  Physical Activity: Not on file  Social Connections: Not on file  Stress: Not on file  Tobacco Use: Medium Risk   Smoking Tobacco Use: Former   Smokeless Tobacco Use: Former  Transport planner Needs: Not on file    [place]  No Known Allergies  Medications Reviewed Today     Reviewed by Edythe Clarity, [facility] (Pharmacist) on 10/03/20 at [place] List Status: <None>  Medication Order Taking? Sig Documenting Provider Last Dose Status Informant  Accu-Chek FastClix Lancets MISC [id number] Yes Use as directed to check sugars. DX E11.65 [name], MD Taking Active   albuterol [facility]) [phone] Base) MCG/ACT inhaler [id number] Yes TAKE 2 PUFFS BY MOUTH EVERY 4 HOURS AS NEEDED [name], MD Taking Active   atenolol (TENORMIN) 25 MG tablet [id number] Yes Take 1 tablet (25 mg total) by mouth daily. Jonetta Osgood, NP Taking Active   ciprofloxacin (CIPRO) 500 MG tablet [id number] Yes Take 1 tablet (500 mg total) by mouth 2 (two) times daily. [name], MD Taking Active   clonazePAM Bobbye [place]) 1 MG tablet [id number] Yes TAKE ONE TAB BY MOUTH TWICE DAILY FOR ANXIETY Abernathy, Alyssa, NP Taking Active   fluconazole (DIFLUCAN) 150 MG tablet [id number] Yes Take by mouth. [provider] Taking Active   glimepiride (AMARYL) 1 MG tablet [id number] Yes Take one tab dialy with breakfast [name], MD Taking Active   HYDROcodone-acetaminophen (NORCO/VICODIN) 5-325 MG tablet [id number] Yes TAKE ONE (1) TABLET BY MOUTH TWO TIMES PER DAY AS NEEDED FOR MODERATE OR SEVEREPAIN. [name], MD Taking Active   JANUMET 50-500 MG tablet [id number] Yes Take 1 tablet by mouth daily. [provider] Taking Active   losartan-hydrochlorothiazide (HYZAAR) 50-12.5 MG tablet [id number] Yes Take 1 tablet by mouth daily. [name], MD Taking Active   ondansetron (ZOFRAN-ODT) 4 MG disintegrating tablet [id number] Yes Take 1 tablet (4 mg total) by mouth every 8 (eight) hours as needed for nausea or vomiting. Jonetta Osgood, NP Taking Active   OXYGEN [id number] Yes Inhale into the lungs. [provider] Taking Active   pantoprazole (PROTONIX) 40 MG tablet [id number] Yes Take 1 tablet (40 mg total) by mouth daily. [name], MD Taking Active   SitaGLIPtin-MetFORMIN HCl (JANUMET XR) 50-500 MG TB24 [id number] Yes TAKE ONE TABLET EVERY DAY WITH FOOD FOR DIABETES Jonetta Osgood, NP Taking Active   tiotropium (SPIRIVA HANDIHALER) 18 MCG inhalation capsule [id number] Yes INHALE 1 CAPSULE VIA HANDIHALER ONCE DAILY AT THE SAME TIME EVERY DAY [name], MD Taking Active             Patient Active Problem List   Diagnosis Date Noted   Chronic obstructive pulmonary disease ([place]) 09/03/2019   Supplemental oxygen dependent 09/03/2019   Bilateral hearing loss 03/09/2019   Generalized anxiety disorder 03/09/2019   Goals of care, counseling/discussion 04/18/2018   Mass of upper lobe of left lung 04/18/2018   Intermittent asthma without complication 40/35/2481   Chronic pain disorder 06/07/2017   Essential hypertension 06/07/2017   Mixed hyperlipidemia 02/03/2017   Hypothyroidism 02/03/2017   Type 2 diabetes mellitus with hyperglycemia ([place]) 02/03/2017   Dysuria 02/03/2017    Immunization History  Administered Date(s) Administered   Influenza Inj Mdck Quad Pf 01/03/2019, 01/17/2020   Pneumococcal Polysaccharide-23 11/29/2008, 11/22/2013    Conditions to be addressed/monitored:  HTN, Hypothyroidism, Type II DM, COPD, HLD, Anxiety  Care Plan : General Pharmacy (Adult)  Updates made by Edythe Clarity, RPH since 10/03/2020 12:00 AM     Problem: HTN, Hypothyroidism, Type II DM, COPD, HLD, Anxiety   Priority: High  Onset Date: 10/03/2020     Long-Range Goal: Patient-Specific Goal   Start Date:  10/03/2020  Expected End Date: 04/05/2021  This Visit's Progress: On track  Priority: High  Note:   Current Barriers:  Unable to independently monitor therapeutic efficacy Suboptimal therapeutic regimen for HLD  Pharmacist Clinical Goal(s):  Patient will achieve adherence to monitoring guidelines and medication adherence to achieve therapeutic efficacy maintain control of glucose as evidenced by A1c  through collaboration with PharmD and provider.   Interventions: 1:1 collaboration with [name], MD regarding development and update of comprehensive plan of care as evidenced by provider attestation and co-signature Inter-disciplinary care team collaboration (see longitudinal plan of care) Comprehensive medication review performed; medication list updated in electronic medical record  Hypertension (BP goal <140/90) -Controlled -Current treatment: Atenolol 63m daily Losartan/HCTZ 50-12.535mdaily -Medications previously tried: none noted  -Current home readings: not checking at home - does not have anything to check with  -Denies hypotensive/hypertensive symptoms -Educated on BP goals and benefits of medications for prevention of heart attack, stroke and kidney damage; Exercise goal of 150 minutes per week; Importance of home blood pressure monitoring; Symptoms of hypotension and importance of maintaining adequate hydration; -Counseled to monitor BP at home a few times per week, document, and provide log at future appointments -Patient on multiple meds for BP and has no home monitoring device.  Would be good candidate for RPM because she cannot drive anywhere to check her BP that way we can keep an eye on it here in the office. -Recommended to continue current medication Recommended RPM device for remote BP monitoring, will send to patient within the next two weeks.  Hyperlipidemia: (LDL goal < 70) -Not ideally controlled -Current treatment: None at this time -Medications  previously tried: none noted  -Patient with diabetes not on statin -Educated on Cholesterol goals;  Benefits of statin for ASCVD risk reduction; Importance of limiting foods high in cholesterol; -Recommended to continue current medication Recommend recheck lipid panel, if elevated could consider statin since she has DM. Also her ASCVD risk is 52.7% which is extremely high so patient would benefit from statin for secondary prevention.  Diabetes (A1c goal <7%) -Controlled -Current medications: Janumet XR 50-50047maily Glimepiride 1mg65mily with breakfast -Medications previously tried: none noted  -Current home glucose readings fasting glucose: 90-100 -Denies hypoglycemic/hyperglycemic symptoms -Glucose is well controlled, recently decreased to of glimepiride -Educated on A1c and blood sugar goals; Prevention and management of hypoglycemic episodes; Benefits of routine self-monitoring of blood sugar; -Counseled to check feet daily and get yearly eye exams -Denies any recent hypoglycemia -Counseled on diet and exercise extensively Recommended to continue current medication  COPD (Goal: control symptoms and prevent exacerbations) -Controlled -Current treatment  Albuterol HFA 90mc64mn Spiriva 18mcg62me daily -Medications previously tried: none noted   -Pulmonary function testing: Pulmonary Functions Testing Results:  No results found for: FEV1, FVC, FEV1FVC, TLC, DLCO  -Exacerbations requiring treatment in last 6 months:  none -Patient reports consistent use of maintenance inhaler -Frequency of rescue inhaler use: infrequent -Counseled on Proper inhaler technique; Benefits of consistent maintenance inhaler use When to use rescue inhaler Differences between maintenance and rescue inhalers -Recommended to continue current medication Patient mentions she needs hoses and a battery for her oxygen, will contact [employer] to see what we can do to help.  Anxiety (Goal: Minimize  symptoms) -Controlled -Current treatment: Clonazepam 1mg BI78mMedications previously tried/failed: none noted -PHQ9:  PHQ9 SCORE ONLY 05/20/2020 06/04/2019 03/06/2019  PHQ-9 Total Score 0 0 1   -GAD7: No flowsheet data found.  -Educated on Benefits of medication for symptom control Risk of increased drowsiness with Norco. -Recommended to continue current medication Recommended she try to separate clonazepam and Norco as much as possible.  Patient Goals/Self-Care Activities Patient  will:  - take medications as prescribed focus on medication adherence by pill box check glucose daily, document, and provide at future appointments check blood pressure a few times per week, document, and provide at future appointments  Follow Up Plan: The care management team will reach out to the patient again over the next 180 days.        Medication Assistance: None required.  Patient affirms current coverage meets needs.  Compliance/Adherence/Medication fill history: Care Gaps: DEXA SCAN FOOT EXAM  Star-Rating Drugs: Glimepiride 1 mg 90 DS 07/22/20 Losartan-Hydrochlorothiazide 50-12.5 mg  90 DS 07/22/20 Sitagliptin-Metformin 50-500 mg 90 DS 08/20/20  Patient's preferred pharmacy is:  [place] [address] Phone: [phone] Fax: [phone]  [id number] [address]: [phone]: [phone] Uses pill box? Yes Pt endorses 100% compliance  We discussed: Benefits of medication synchronization, packaging and delivery as well as enhanced pharmacist oversight with [employer]. Patient decided to: Continue current medication management strategy  Care Plan and Follow Up Patient Decision:  Patient agrees to Care Plan and Follow-up.  Plan: The care management team will reach out to the patient again over the next 180 days.  CBeverly Milch  PharmD Clinical Pharmacist [facility]([phone]

## 2020-10-03 NOTE — Patient Instructions (Addendum)
Visit Information   Goals Addressed             This Visit's Progress    Track and Manage My Blood Pressure-Hypertension       Timeframe:  Long-Range Goal Priority:  High Start Date:  10/03/20                           Expected End Date:  04/05/21                     Follow Up Date 01/14/21    - check blood pressure 3 times per week - choose a place to take my blood pressure (home, clinic or office, retail store) - write blood pressure results in a log or diary    Why is this important?   You won't feel high blood pressure, but it can still hurt your blood vessels.  High blood pressure can cause heart or kidney problems. It can also cause a stroke.  Making lifestyle changes like losing a little weight or eating less salt will help.  Checking your blood pressure at home and at different times of the day can help to control blood pressure.  If the doctor prescribes medicine remember to take it the way the doctor ordered.  Call the office if you cannot afford the medicine or if there are questions about it.     Notes:        Patient Care Plan: General Pharmacy (Adult)     Problem Identified: HTN, Hypothyroidism, Type II DM, COPD, HLD, Anxiety   Priority: High  Onset Date: 10/03/2020     Long-Range Goal: Patient-Specific Goal   Start Date: 10/03/2020  Expected End Date: 04/05/2021  This Visit's Progress: On track  Priority: High  Note:   Current Barriers:  Unable to independently monitor therapeutic efficacy Suboptimal therapeutic regimen for HLD  Pharmacist Clinical Goal(s):  Patient will achieve adherence to monitoring guidelines and medication adherence to achieve therapeutic efficacy maintain control of glucose as evidenced by A1c  through collaboration with PharmD and provider.   Interventions: 1:1 collaboration with [name], MD regarding development and update of comprehensive plan of care as evidenced by provider attestation and  co-signature Inter-disciplinary care team collaboration (see longitudinal plan of care) Comprehensive medication review performed; medication list updated in electronic medical record  Hypertension (BP goal <140/90) -Controlled -Current treatment: Atenolol 25mg  daily Losartan/HCTZ 50-12.5mg  daily -Medications previously tried: none noted  -Current home readings: not checking at home - does not have anything to check with  -Denies hypotensive/hypertensive symptoms -Educated on BP goals and benefits of medications for prevention of heart attack, stroke and kidney damage; Exercise goal of 150 minutes per week; Importance of home blood pressure monitoring; Symptoms of hypotension and importance of maintaining adequate hydration; -Counseled to monitor BP at home a few times per week, document, and provide log at future appointments -Patient on multiple meds for BP and has no home monitoring device.  Would be good candidate for RPM because she cannot drive anywhere to check her BP that way we can keep an eye on it here in the office. -Recommended to continue current medication Recommended RPM device for remote BP monitoring, will send to patient within the next two weeks.  Hyperlipidemia: (LDL goal < 70) -Not ideally controlled -Current treatment: None at this time -Medications previously tried: none noted  -Patient with diabetes not on statin -Educated on Cholesterol goals;  Benefits of statin for ASCVD risk reduction; Importance of limiting foods high in cholesterol; -Recommended to continue current medication Recommend recheck lipid panel, if elevated could consider statin since she has DM. Also her ASCVD risk is 52.7% which is extremely high so patient would benefit from statin for secondary prevention.  Diabetes (A1c goal <7%) -Controlled -Current medications: Janumet XR 50-500mg  daily Glimepiride 1mg  daily with breakfast -Medications previously tried: none noted  -Current home  glucose readings fasting glucose: 90-100 -Denies hypoglycemic/hyperglycemic symptoms -Glucose is well controlled, recently decreased to of glimepiride -Educated on A1c and blood sugar goals; Prevention and management of hypoglycemic episodes; Benefits of routine self-monitoring of blood sugar; -Counseled to check feet daily and get yearly eye exams -Denies any recent hypoglycemia -Counseled on diet and exercise extensively Recommended to continue current medication  COPD (Goal: control symptoms and prevent exacerbations) -Controlled -Current treatment  Albuterol HFA 80mcg prn Spiriva 18mcg once daily -Medications previously tried: none noted   -Pulmonary function testing: Pulmonary Functions Testing Results:  No results found for: FEV1, FVC, FEV1FVC, TLC, DLCO  -Exacerbations requiring treatment in last 6 months:  none -Patient reports consistent use of maintenance inhaler -Frequency of rescue inhaler use: infrequent -Counseled on Proper inhaler technique; Benefits of consistent maintenance inhaler use When to use rescue inhaler Differences between maintenance and rescue inhalers -Recommended to continue current medication Patient mentions she needs hoses and a battery for her oxygen, will contact [employer] to see what we can do to help.  Anxiety (Goal: Minimize symptoms) -Controlled -Current treatment: Clonazepam 1mg  BID -Medications previously tried/failed: none noted -PHQ9:  PHQ9 SCORE ONLY 05/20/2020 06/04/2019 03/06/2019  PHQ-9 Total Score 0 0 1   -GAD7: No flowsheet data found.  -Educated on Benefits of medication for symptom control Risk of increased drowsiness with Norco. -Recommended to continue current medication Recommended she try to separate clonazepam and Norco as much as possible.  Patient Goals/Self-Care Activities Patient will:  - take medications as prescribed focus on medication adherence by pill box check glucose daily, document, and provide at  future appointments check blood pressure a few times per week, document, and provide at future appointments  Follow Up Plan: The care management team will reach out to the patient again over the next 180 days.       Ms. Tiedeman was given information about Chronic Care Management services today including:  CCM service includes personalized support from designated clinical staff supervised by her physician, including individualized plan of care and coordination with other care providers 24/7 contact phone numbers for assistance for urgent and routine care needs. Standard insurance, coinsurance, copays and deductibles apply for chronic care management only during months in which we provide at least 20 minutes of these services. Most insurances cover these services at 100%, however patients may be responsible for any copay, coinsurance and/or deductible if applicable. This service may help you avoid the need for more expensive face-to-face services. Only one practitioner may furnish and bill the service in a calendar month. The patient may stop CCM services at any time (effective at the end of the month) by phone call to the office staff.  Patient agreed to services and verbal consent obtained.   The patient verbalized understanding of instructions, educational materials, and care plan provided today and agreed to receive a mailed copy of patient instructions, educational materials, and care plan.  Telephone follow up appointment with pharmacy team member scheduled for: 6 months  Edythe Clarity, [place]

## 2020-10-03 NOTE — Progress Notes (Addendum)
    Chronic Care Management Pharmacy Assistant   Name: Lynn Robbins  MRN: [id number] DOB: 1940-03-18  Reason for Encounter: RPM Onboarding   Medications: Outpatient Encounter Medications as of 10/03/2020  Medication Sig   Accu-Chek FastClix Lancets MISC Use as directed to check sugars. DX E11.65   albuterol (PROAIR HFA) 108 (90 Base) MCG/ACT inhaler TAKE 2 PUFFS BY MOUTH EVERY 4 HOURS AS NEEDED   atenolol (TENORMIN) 25 MG tablet Take 1 tablet (25 mg total) by mouth daily.   ciprofloxacin (CIPRO) 500 MG tablet Take 1 tablet (500 mg total) by mouth 2 (two) times daily.   clonazePAM (KLONOPIN) 1 MG tablet TAKE ONE TAB BY MOUTH TWICE DAILY FOR ANXIETY   fluconazole (DIFLUCAN) 150 MG tablet Take by mouth.   glimepiride (AMARYL) 1 MG tablet Take one tab dialy with breakfast   HYDROcodone-acetaminophen (NORCO/VICODIN) 5-325 MG tablet TAKE ONE (1) TABLET BY MOUTH TWO TIMES PER DAY AS NEEDED FOR MODERATE OR SEVEREPAIN.   JANUMET 50-500 MG tablet Take 1 tablet by mouth daily.   losartan-hydrochlorothiazide (HYZAAR) 50-12.5 MG tablet Take 1 tablet by mouth daily.   ondansetron (ZOFRAN-ODT) 4 MG disintegrating tablet Take 1 tablet (4 mg total) by mouth every 8 (eight) hours as needed for nausea or vomiting.   OXYGEN Inhale into the lungs.   pantoprazole (PROTONIX) 40 MG tablet Take 1 tablet (40 mg total) by mouth daily.   SitaGLIPtin-MetFORMIN HCl (JANUMET XR) 50-500 MG TB24 TAKE ONE TABLET EVERY DAY WITH FOOD FOR DIABETES   tiotropium (SPIRIVA HANDIHALER) 18 MCG inhalation capsule INHALE 1 CAPSULE VIA HANDIHALER ONCE DAILY AT THE SAME TIME EVERY DAY   No facility-administered encounter medications on file as of 10/03/2020.   Spoke with the patient about starting RPM. Explained to the patient about the process and she voiced undetstanding. Filled out the patients form uploaded the form to the RPM team today (10/03/20).  Follow-Up:Pharmacist Review  Charlann Lange, [place] Pharmacist  Assistant [phone]

## 2020-10-05 DIAGNOSIS — J449 Chronic obstructive pulmonary disease, unspecified: Secondary | ICD-10-CM | POA: Diagnosis not present

## 2020-10-15 DIAGNOSIS — J449 Chronic obstructive pulmonary disease, unspecified: Secondary | ICD-10-CM | POA: Diagnosis not present

## 2020-10-15 DIAGNOSIS — E1165 Type 2 diabetes mellitus with hyperglycemia: Secondary | ICD-10-CM | POA: Diagnosis not present

## 2020-10-15 DIAGNOSIS — I10 Essential (primary) hypertension: Secondary | ICD-10-CM | POA: Diagnosis not present

## 2020-10-27 ENCOUNTER — Telehealth: Payer: Self-pay

## 2020-10-27 NOTE — Telephone Encounter (Signed)
Patient called stating someone is to come to her house 10/29/20 to set her up on BP monitor. She stated she does not want it. I spoke with her son, Elta [place]. He believes it was [place] that requested it. I gave him telephone [phone] to call-Toni

## 2020-10-30 ENCOUNTER — Telehealth: Payer: Self-pay

## 2020-10-30 ENCOUNTER — Other Ambulatory Visit: Payer: Self-pay | Admitting: Nurse Practitioner

## 2020-10-30 DIAGNOSIS — G894 Chronic pain syndrome: Secondary | ICD-10-CM

## 2020-10-30 MED ORDER — HYDROCODONE-ACETAMINOPHEN 5-325 MG PO TABS: 1.0000 | ORAL_TABLET | Freq: Two times a day (BID) | ORAL | 0 refills | Status: DC | PRN

## 2020-10-31 NOTE — Telephone Encounter (Signed)
Med sent.

## 2020-11-05 DIAGNOSIS — J449 Chronic obstructive pulmonary disease, unspecified: Secondary | ICD-10-CM | POA: Diagnosis not present

## 2020-11-18 ENCOUNTER — Other Ambulatory Visit: Payer: Self-pay

## 2020-11-18 ENCOUNTER — Ambulatory Visit
Admission: RE | Admit: 2020-11-18 | Discharge: 2020-11-18 | Disposition: A | Discharge status: Home / Self Care | Payer: Medicare Other | Source: Ambulatory Visit | Attending: Oncology | Admitting: Oncology

## 2020-11-18 DIAGNOSIS — J439 Emphysema, unspecified: Secondary | ICD-10-CM | POA: Diagnosis not present

## 2020-11-18 DIAGNOSIS — C3412 Malignant neoplasm of upper lobe, left bronchus or lung: Secondary | ICD-10-CM | POA: Diagnosis not present

## 2020-11-18 DIAGNOSIS — I700 Atherosclerosis of aorta: Secondary | ICD-10-CM | POA: Diagnosis not present

## 2020-11-18 DIAGNOSIS — R911 Solitary pulmonary nodule: Secondary | ICD-10-CM | POA: Diagnosis not present

## 2020-11-18 DIAGNOSIS — C3490 Malignant neoplasm of unspecified part of unspecified bronchus or lung: Secondary | ICD-10-CM | POA: Diagnosis not present

## 2020-11-20 ENCOUNTER — Inpatient Hospital Stay: Payer: Medicare Other | Attending: Oncology | Admitting: Nurse Practitioner

## 2020-11-20 ENCOUNTER — Encounter: Payer: Self-pay | Admitting: Nurse Practitioner

## 2020-11-20 ENCOUNTER — Telehealth: Payer: Self-pay

## 2020-11-20 DIAGNOSIS — Z85118 Personal history of other malignant neoplasm of bronchus and lung: Secondary | ICD-10-CM | POA: Diagnosis not present

## 2020-11-20 DIAGNOSIS — C3412 Malignant neoplasm of upper lobe, left bronchus or lung: Secondary | ICD-10-CM | POA: Diagnosis not present

## 2020-11-20 DIAGNOSIS — Z08 Encounter for follow-up examination after completed treatment for malignant neoplasm: Secondary | ICD-10-CM

## 2020-11-20 NOTE — Progress Notes (Signed)
Pt son states random bruising is coming along; in between her thighs, left leg inner thughs and arms. Son will like to know if she can have a stronger pain medication.

## 2020-11-20 NOTE — Progress Notes (Signed)
Virtual Visit Progress Note  I connected with Lynn Robbins on 11/20/20 at  2:30 PM EDT by video enabled telemedicine visit and verified that I am speaking with the correct person using two identifiers.   I discussed the limitations, risks, security and privacy concerns of performing an evaluation and management service by telemedicine and the availability of in-person appointments. I also discussed with the patient that there may be a patient responsible charge related to this service. The patient expressed understanding and agreed to proceed.   Other persons participating in the visit and their role in the encounter: patient's son   Patient's location: home  Provider's location: work   Risk analyst Complaint: Discuss CT scan results and further management  History of present illness: patient is a 80 year old female with a past medical history significant for COPD for which she is on continuous oxygen 2 L, hypertension, CHF who presented to the ER on 04/10/2018 with some symptoms of chest wall pain which led to CT chest and CT abdomen.  CT scan showed left upper lobe spiculated mass highly concerning for primary lung cancer.  There was also a second nodule in the right upper lobe which may represent synchronous tumor.  No significant mediastinal adenopathy.  This was followed by a PET CT scan on 04/14/2018 which showed hypermetabolic left upper lobe lung mass 3.5 x 2.3 cm with an SUV of 13.5.  Second nodule in the right upper lobe measuring 14 mm which did not show any hypermetabolism.  No hypermetabolic mediastinal lymph nodes.  Patient received SBRT to that the lesion  Interval history: Patient has ongoing chronic fatigue and exertional shortness of breath.  She is accompanied by her son who also contributes to history.  Overall she feels at baseline.   Review of Systems  Constitutional:  Positive for malaise/fatigue. Negative for chills, fever and weight loss.  HENT:  Negative for congestion, ear  discharge and nosebleeds.   Eyes:  Negative for blurred vision.  Respiratory:  Positive for shortness of breath. Negative for cough, hemoptysis, sputum production and wheezing.   Cardiovascular:  Negative for chest pain, palpitations, orthopnea and claudication.  Gastrointestinal:  Negative for abdominal pain, blood in stool, constipation, diarrhea, heartburn, melena, nausea and vomiting.  Genitourinary:  Positive for dysuria. Negative for flank pain, frequency, hematuria and urgency.  Musculoskeletal:  Negative for back pain, joint pain and myalgias.  Skin:  Negative for rash.  Neurological:  Negative for dizziness, tingling, focal weakness, seizures, weakness and headaches.  Endo/Heme/Allergies:  Does not bruise/bleed easily.  Psychiatric/Behavioral:  Negative for depression and suicidal ideas. The patient does not have insomnia.    No Known Allergies  Past Medical History:  Diagnosis Date   Cancer ([place])    CHF (congestive heart failure) (HCC)    COPD (chronic obstructive pulmonary disease) (HCC)    Depression    Diabetes mellitus without complication (HCC)    HOH (hard of hearing)    Hypertension     Past Surgical History:  Procedure Laterality Date   CESAREAN SECTION      Social History   Socioeconomic History   Marital status: Single    Spouse name: Not on file   Number of children: 5   Years of education: Not on file   Highest education level: Not on file  Occupational History   Not on file  Tobacco Use   Smoking status: Former    Packs/day: 3.00    Years: 57.00    Pack years: 171.00  Types: Cigarettes    Quit date: 04/17/1998    Years since quitting: 22.6   Smokeless tobacco: Former    Types: Snuff  Vaping Use   Vaping Use: Never used  Substance and Sexual Activity   Alcohol use: No   Drug use: No   Sexual activity: Never  Other Topics Concern   Not on file  Social History Narrative   ** Merged History Encounter **       Social Determinants of  Health   Financial Resource Strain: Low Risk    Difficulty of Paying Living Expenses: Not very hard  Food Insecurity: Not on file  Transportation Needs: Not on file  Physical Activity: Not on file  Stress: Not on file  Social Connections: Not on file  Intimate Partner Violence: Not on file    Family History  Problem Relation Age of Onset   Diabetes Father    Colon cancer Father    Colon cancer Mother    Aneurysm Mother 12       brain   Lung cancer Sister    Breast cancer Sister    Lung cancer Brother    Lung cancer Sister    Bone cancer Brother    Lung cancer Brother    Cancer Sister        type unknown    Current Outpatient Medications:    Accu-Chek FastClix Lancets MISC, Use as directed to check sugars. DX E11.65, Disp: 306 each, Rfl: 3   albuterol (PROAIR HFA) 108 (90 Base) MCG/ACT inhaler, TAKE 2 PUFFS BY MOUTH EVERY 4 HOURS AS NEEDED, Disp: 54 g, Rfl: 4   atenolol (TENORMIN) 25 MG tablet, Take 1 tablet (25 mg total) by mouth daily., Disp: 90 tablet, Rfl: 3   ciprofloxacin (CIPRO) 500 MG tablet, Take 1 tablet (500 mg total) by mouth 2 (two) times daily., Disp: 20 tablet, Rfl: 0   clonazePAM (KLONOPIN) 1 MG tablet, TAKE ONE TAB BY MOUTH TWICE DAILY FOR ANXIETY, Disp: 60 tablet, Rfl: 3   fluconazole (DIFLUCAN) 150 MG tablet, Take by mouth., Disp: , Rfl:    glimepiride (AMARYL) 1 MG tablet, Take one tab dialy with breakfast, Disp: 90 tablet, Rfl: 3   HYDROcodone-acetaminophen (NORCO/VICODIN) 5-325 MG tablet, Take 1 tablet by mouth 2 (two) times daily as needed for moderate pain or severe pain., Disp: 60 tablet, Rfl: 0   JANUMET 50-500 MG tablet, Take 1 tablet by mouth daily., Disp: , Rfl:    losartan-hydrochlorothiazide (HYZAAR) 50-12.5 MG tablet, Take 1 tablet by mouth daily., Disp: 90 tablet, Rfl: 3   ondansetron (ZOFRAN-ODT) 4 MG disintegrating tablet, Take 1 tablet (4 mg total) by mouth every 8 (eight) hours as needed for nausea or vomiting., Disp: 90 tablet, Rfl: 0    OXYGEN, Inhale into the lungs., Disp: , Rfl:    pantoprazole (PROTONIX) 40 MG tablet, Take 1 tablet (40 mg total) by mouth daily., Disp: 90 tablet, Rfl: 3   SitaGLIPtin-MetFORMIN HCl (JANUMET XR) 50-500 MG TB24, TAKE ONE TABLET EVERY DAY WITH FOOD FOR DIABETES, Disp: 90 tablet, Rfl: 3   tiotropium (SPIRIVA HANDIHALER) 18 MCG inhalation capsule, INHALE 1 CAPSULE VIA HANDIHALER ONCE DAILY AT THE SAME TIME EVERY DAY, Disp: 90 capsule, Rfl: 3  CT Chest Wo Contrast  Result Date: 11/19/2020 CLINICAL DATA:  80 year old female per cents with follow-up for lung cancer. EXAM: CT CHEST WITHOUT CONTRAST TECHNIQUE: Multidetector CT imaging of the chest was performed following the standard protocol without IV contrast. COMPARISON:  Comparison made  with evaluation from May 15, 2020 FINDINGS: Cardiovascular: Calcified atheromatous plaque of the thoracic aorta. Normal caliber of the thoracic aorta. Normal heart size without substantial pericardial effusion. Main pulmonary artery approximately 3.6 cm. Limited assessment of cardiovascular structures given lack of intravenous contrast. Mediastinum/Nodes: Esophagus grossly normal. No signs of adenopathy in the chest. Lungs/Pleura: Post radiotherapy of nodules in the LEFT chest. No discrete nodule can be separated from the consolidative changes at the periphery of the LEFT upper lobe this area shows convex margins on the current study measuring approximately the 3.4 x 2.1 cm in total. The nodule and adjacent lung and pleura in this location on the previous study measured approximately 2.8 x 1.6 cm. RIGHT upper lobe nodule (image 64/3) 13 x 7 mm cavitary and or cystic along bronchovascular structures in the RIGHT upper lobe. This appears smaller than in September of 2020 and slightly more conspicuous than on previous imaging from March of 2022, also showing some ground-glass scattered throughout the periphery of the RIGHT upper lobe Other areas of nodularity in the chest are  stable including calcified nodules in the RIGHT lung base. No effusion. Background pulmonary emphysema Upper Abdomen: Cystic areas associated with the pancreas are incompletely evaluated. Dominant area extending cephalad from the pancreas along the greater curvature of the stomach measures 4.6 x 3.5 cm previously 4.5 x 3.2 cm Second area in the tail of pancreas (image 141/2) 17 x 17 mm within 1 mm of previous measurements. No inflammation about these areas. No acute process in the upper abdomen. Stable LEFT adrenal adenoma and RIGHT adrenal thickening. Musculoskeletal: Rib fractures adjacent to post treatment changes in the LEFT upper lobe of ribs 3 and 4 on the LEFT. These are chronic with healing since previous imaging obtained in March. IMPRESSION: Post treatment changes in the LEFT upper lobe. The larger area of density/consolidation when compared to the study from March of 2022. Convex margins are noted. While findings may reflect evolution of post treatment changes given the substantial change greater than 2 years out from therapy would consider the possibility of localized disease recurrence. PET scan may be helpful to differentiate. Stable small nodules elsewhere in the chest with suspected post inflammatory changes in the RIGHT upper lobe with waxing and waning characteristics. RIGHT upper lobe nodule appears smaller than in September of 2020 and slightly more conspicuous than on previous imaging from March of 2022, also showing some ground-glass scattered throughout the periphery of the RIGHT upper lobe. Cystic areas about the pancreas, incompletely assessed and within 1 mm of previous size. Consider follow-up and or sampling as warranted in this patient as outlined in the MRI report of 04/28/2020. Stable LEFT adrenal adenoma. Aortic Atherosclerosis (ICD10-I70.0) and Emphysema (ICD10-J43.9). Electronically Signed   By: Zetta Bills M.D.   On: 11/19/2020 15:20    No images are attached to the  encounter.  CMP Latest Ref Rng & Units 04/09/2020  Glucose 65 - 99 mg/dL 192(H)  BUN 8 - 27 mg/dL 17  Creatinine 0.57 - 1.00 mg/dL 0.93  Sodium 134 - 144 mmol/L 146(H)  Potassium 3.5 - 5.2 mmol/L 4.1  Chloride 96 - 106 mmol/L 105  CO2 20 - 29 mmol/L 24  Calcium 8.7 - 10.3 mg/dL 9.5  Total Protein 6.0 - 8.5 g/dL 6.4  Total Bilirubin 0.0 - 1.2 mg/dL 0.3  Alkaline Phos 44 - 121 IU/L 101  AST 0 - 40 IU/L 18  ALT 0 - 32 IU/L 14   CBC Latest Ref Rng &  Units 04/09/2020  WBC 3.4 - 10.8 x10E3/uL 8.3  Hemoglobin 11.1 - 15.9 g/dL 12.9  Hematocrit 34.0 - 46.6 % 40.4  Platelets 150 - 450 x10E3/uL 227     Assessment and plan: Patient is a 80 year old female with history of Stage I lung cancer involving the left upper lobe s/p SBRT here to discuss CT scan results and further management.   Imaging from 11/18/2020 CT chest without contrast was independently reviewed.  Left upper lobe previously treated area is somewhat larger when compared to previous imaging.  Unclear if she continues to have posttreatment changes or if this is a local recurrence.  No other adenopathy or enlarging nodules noted.  Discussed possibility of reimaging in 3 to 6 months but I will defer to Dr. Janese Banks.   She was seen by Dr. 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  Patient is high risk for any sort of surgery and anesthesia giving her comorbidities.  No additional work-up was recommended.  Follow-up instructions: 6 mo- ct chest wo contrast F/u w/ Dr. Janese Banks day to week later- (appt can be virtual or in person; pt preference)  Visit Diagnosis: 1. Encounter for follow-up surveillance of lung cancer   2. Malignant neoplasm of upper lobe of left lung ([place])    I discussed the assessment and treatment plan with the patient. The patient was provided an opportunity to ask questions and all were answered. The patient agreed with the plan and demonstrated an  understanding of the instructions.  The patient was advised to call back or seek an in-person evaluation if the symptoms worsen or if the condition fails to improve as anticipated.  I spent 25 minutes face-to-face video visit time dedicated to the care of this patient on the date of this encounter to include pre-visit review of medical oncology notes, GI note, imaging, face-to-face time with the patient, and post visit ordering of testing/documentation.   Beckey Rutter, DNP, AGNP-C [place] at [facility] [phone] (clinic) 11/20/2020

## 2020-11-20 NOTE — Telephone Encounter (Signed)
Received call from patient's son regarding video appointment for today @ 2:30. He stated they had asked to do appointment earlier. He has been waiting for 2 hours and has not heard from anyone. I explained to him her video appointment is not scheduled with [place] and he needs to call their office-Toni

## 2020-11-27 ENCOUNTER — Telehealth: Payer: Self-pay | Admitting: Pharmacist

## 2020-11-27 NOTE — Progress Notes (Signed)
Chronic Care Management Pharmacy Assistant   Name: Lynn Robbins  MRN: [id number] DOB: June 10, 1940  Reason for Encounter: Disease State For HTN.    Conditions to be addressed/monitored: HTN, Hypothyroidism, Type II DM, COPD, HLD, Anxiety  Recent office visits:  None since 10/03/20  Recent consult visits:  11/20/20 Oncology Verlon Au, NP. For follow-up. No medication changes.  Hospital visits:  None since 10/03/20  Medications: Outpatient Encounter Medications as of 11/27/2020  Medication Sig   Accu-Chek FastClix Lancets MISC Use as directed to check sugars. DX E11.65   albuterol (PROAIR HFA) 108 (90 Base) MCG/ACT inhaler TAKE 2 PUFFS BY MOUTH EVERY 4 HOURS AS NEEDED   atenolol (TENORMIN) 25 MG tablet Take 1 tablet (25 mg total) by mouth daily.   ciprofloxacin (CIPRO) 500 MG tablet Take 1 tablet (500 mg total) by mouth 2 (two) times daily.   clonazePAM (KLONOPIN) 1 MG tablet TAKE ONE TAB BY MOUTH TWICE DAILY FOR ANXIETY   fluconazole (DIFLUCAN) 150 MG tablet Take by mouth.   glimepiride (AMARYL) 1 MG tablet Take one tab dialy with breakfast   HYDROcodone-acetaminophen (NORCO/VICODIN) 5-325 MG tablet Take 1 tablet by mouth 2 (two) times daily as needed for moderate pain or severe pain.   JANUMET 50-500 MG tablet Take 1 tablet by mouth daily.   losartan-hydrochlorothiazide (HYZAAR) 50-12.5 MG tablet Take 1 tablet by mouth daily.   ondansetron (ZOFRAN-ODT) 4 MG disintegrating tablet Take 1 tablet (4 mg total) by mouth every 8 (eight) hours as needed for nausea or vomiting.   OXYGEN Inhale into the lungs.   pantoprazole (PROTONIX) 40 MG tablet Take 1 tablet (40 mg total) by mouth daily.   SitaGLIPtin-MetFORMIN HCl (JANUMET XR) 50-500 MG TB24 TAKE ONE TABLET EVERY DAY WITH FOOD FOR DIABETES   tiotropium (SPIRIVA HANDIHALER) 18 MCG inhalation capsule INHALE 1 CAPSULE VIA HANDIHALER ONCE DAILY AT THE SAME TIME EVERY DAY   No facility-administered encounter medications on file as  of 11/27/2020.   Reviewed chart prior to disease state call. Spoke with patient regarding BP  Recent Office Vitals: BP Readings from Last 3 Encounters:  08/20/20 130/75  06/04/20 112/62  04/09/20 120/70   Pulse Readings from Last 3 Encounters:  08/20/20 74  06/04/20 83  04/09/20 83    Wt Readings from Last 3 Encounters:  08/20/20 162 lb 6.4 oz (73.7 kg)  06/04/20 168 lb 6.4 oz (76.4 kg)  05/20/20 170 lb (77.1 kg)     Kidney Function Lab Results  Component Value Date/Time   CREATININE 0.93 04/09/2020 09:25 AM   CREATININE 0.84 11/15/2019 11:43 AM   CREATININE 0.88 01/29/2014 11:01 AM   CREATININE 1.05 03/22/2011 07:19 PM   GFRNONAA 59 (L) 04/09/2020 09:25 AM   GFRNONAA >60 01/29/2014 11:01 AM   GFRAA 68 04/09/2020 09:25 AM   GFRAA >60 01/29/2014 11:01 AM    BMP Latest Ref Rng & Units 04/09/2020 11/15/2019 11/13/2019  Glucose 65 - 99 mg/dL 192(H) 195(H) -  BUN 8 - 27 mg/dL 17 20 -  Creatinine 0.57 - 1.00 mg/dL 0.93 0.84 0.90  BUN/Creat Ratio 12 - 28 18 - -  Sodium 134 - 144 mmol/L 146(H) 144 -  Potassium 3.5 - 5.2 mmol/L 4.1 4.2 -  Chloride 96 - 106 mmol/L 105 105 -  CO2 20 - 29 mmol/L 24 31 -  Calcium 8.7 - 10.3 mg/dL 9.5 9.2 -    Current antihypertensive regimen:  Atenolol 25mg  daily Losartan/HCTZ 50-12.5mg  daily  How often  are you checking your Blood Pressure? Patient stated she doesn't check her blood pressure at home.  Current home BP readings: Patient stated she doesn't check her blood pressure at home.  What recent interventions/DTPs have been made by any provider to improve Blood Pressure control since last CPP Visit: None.   Any recent hospitalizations or ED visits since last visit with CPP? Patient state no.  What diet changes have been made to improve Blood Pressure Control?  Patient stated three meals daily. She doesn't eat bread. She stated she drinks milks and diet drinks.   What exercise is being done to improve your Blood Pressure Control?   Patient stated she takes naps during the day but does do some actives around the house daily.   Adherence Review: Is the patient currently on ACE/ARB medication? N/A.  Does the patient have >5 day gap between last estimated fill dates? N/A.  Care Gaps:Patient is due for eye, foot, and DEXA Scan.  Star Rating Drugs:Janument 50-500 mg 08/20/20 90 DS, Janumet XR 50-500 mg 10/02/20 90 DS, Glimepiride 1 mg 10/22/20 90 DS.   Follow-Up:Pharmacist Review  Charlann Lange, [place] Pharmacist Assistant [phone]

## 2020-12-05 DIAGNOSIS — J449 Chronic obstructive pulmonary disease, unspecified: Secondary | ICD-10-CM | POA: Diagnosis not present

## 2020-12-08 ENCOUNTER — Other Ambulatory Visit: Payer: Self-pay | Admitting: Nurse Practitioner

## 2020-12-08 DIAGNOSIS — G894 Chronic pain syndrome: Secondary | ICD-10-CM

## 2020-12-15 DIAGNOSIS — E1165 Type 2 diabetes mellitus with hyperglycemia: Secondary | ICD-10-CM | POA: Diagnosis not present

## 2020-12-15 DIAGNOSIS — J449 Chronic obstructive pulmonary disease, unspecified: Secondary | ICD-10-CM | POA: Diagnosis not present

## 2020-12-15 DIAGNOSIS — I10 Essential (primary) hypertension: Secondary | ICD-10-CM | POA: Diagnosis not present

## 2020-12-23 ENCOUNTER — Telehealth: Payer: Self-pay

## 2020-12-23 NOTE — Telephone Encounter (Signed)
Lynn Robbins with [facility] called requesting approval for a manufacturer change for the ProAir inhaler since the brand name med is no longer available. I told him it was fine and if the pt had any questions to have her call us

## 2021-01-05 ENCOUNTER — Other Ambulatory Visit: Payer: Self-pay | Admitting: Internal Medicine

## 2021-01-05 DIAGNOSIS — G894 Chronic pain syndrome: Secondary | ICD-10-CM

## 2021-01-05 DIAGNOSIS — J449 Chronic obstructive pulmonary disease, unspecified: Secondary | ICD-10-CM | POA: Diagnosis not present

## 2021-01-05 NOTE — Telephone Encounter (Signed)
Refill sent.

## 2021-01-05 NOTE — Telephone Encounter (Signed)
Last here7/22 and next 01/19/2021

## 2021-01-19 ENCOUNTER — Encounter: Payer: Self-pay | Admitting: Nurse Practitioner

## 2021-01-19 ENCOUNTER — Ambulatory Visit (INDEPENDENT_AMBULATORY_CARE_PROVIDER_SITE_OTHER): Payer: Medicare Other | Admitting: Nurse Practitioner

## 2021-01-19 ENCOUNTER — Other Ambulatory Visit: Payer: Self-pay

## 2021-01-19 VITALS — BP 127/60 | HR 94 | Temp 99.0°F | Ht 60.0 in | Wt 167.0 lb

## 2021-01-19 DIAGNOSIS — K8689 Other specified diseases of pancreas: Secondary | ICD-10-CM

## 2021-01-19 DIAGNOSIS — E1165 Type 2 diabetes mellitus with hyperglycemia: Secondary | ICD-10-CM | POA: Diagnosis not present

## 2021-01-19 DIAGNOSIS — R300 Dysuria: Secondary | ICD-10-CM | POA: Diagnosis not present

## 2021-01-19 DIAGNOSIS — Z0001 Encounter for general adult medical examination with abnormal findings: Secondary | ICD-10-CM | POA: Diagnosis not present

## 2021-01-19 DIAGNOSIS — Z9981 Dependence on supplemental oxygen: Secondary | ICD-10-CM

## 2021-01-19 DIAGNOSIS — C3412 Malignant neoplasm of upper lobe, left bronchus or lung: Secondary | ICD-10-CM | POA: Diagnosis not present

## 2021-01-19 DIAGNOSIS — Z23 Encounter for immunization: Secondary | ICD-10-CM

## 2021-01-19 DIAGNOSIS — J449 Chronic obstructive pulmonary disease, unspecified: Secondary | ICD-10-CM

## 2021-01-19 DIAGNOSIS — I10 Essential (primary) hypertension: Secondary | ICD-10-CM | POA: Diagnosis not present

## 2021-01-19 DIAGNOSIS — F411 Generalized anxiety disorder: Secondary | ICD-10-CM

## 2021-01-19 LAB — POCT GLYCOSYLATED HEMOGLOBIN (HGB A1C): Hemoglobin A1C: 5.6 % (ref 4.0–5.6)

## 2021-01-19 MED ORDER — ACCU-CHEK FASTCLIX LANCETS MISC: 3 refills | Status: DC

## 2021-01-19 MED ORDER — CLONAZEPAM 1 MG PO TABS: ORAL_TABLET | ORAL | 2 refills | Status: DC

## 2021-01-19 MED ORDER — SPIRIVA HANDIHALER 18 MCG IN CAPS: ORAL_CAPSULE | RESPIRATORY_TRACT | 3 refills | Status: DC

## 2021-01-19 MED ORDER — GLIMEPIRIDE 1 MG PO TABS: ORAL_TABLET | ORAL | 3 refills | Status: DC

## 2021-01-19 MED ORDER — ACCU-CHEK GUIDE VI STRP: ORAL_STRIP | 12 refills | Status: DC

## 2021-01-19 MED ORDER — LOSARTAN POTASSIUM-HCTZ 50-12.5 MG PO TABS: 1.0000 | ORAL_TABLET | Freq: Every day | ORAL | 3 refills | Status: DC

## 2021-01-19 MED ORDER — PNEUMOCOCCAL 20-VAL CONJ VACC 0.5 ML IM SUSY: 0.5000 mL | PREFILLED_SYRINGE | Freq: Once | INTRAMUSCULAR | 0 refills | Status: AC

## 2021-01-19 MED ORDER — ALBUTEROL SULFATE HFA 108 (90 BASE) MCG/ACT IN AERS: 2.0000 | INHALATION_SPRAY | Freq: Four times a day (QID) | RESPIRATORY_TRACT | 5 refills | Status: DC | PRN

## 2021-01-19 MED ORDER — PANTOPRAZOLE SODIUM 40 MG PO TBEC: 40.0000 mg | DELAYED_RELEASE_TABLET | Freq: Every day | ORAL | 3 refills | Status: DC

## 2021-01-19 MED ORDER — JANUMET XR 50-500 MG PO TB24: ORAL_TABLET | ORAL | 3 refills | Status: DC

## 2021-01-19 MED ORDER — ATENOLOL 25 MG PO TABS: 25.0000 mg | ORAL_TABLET | Freq: Every day | ORAL | 3 refills | Status: DC

## 2021-01-19 NOTE — Progress Notes (Signed)
[facility] [place] [place], [place] [address]  Internal MEDICINE  Office Visit Note  Patient Name: Lynn Robbins  [id number]  [id number]  Date of Service: 01/19/2021  Chief Complaint  Patient presents with   Medicare Wellness   Diabetes   Depression   Hypertension   COPD    HPI Lynn Robbins presents for an annual well visit and physical exam.  Lynn Robbins is an 80 year old well-appearing female.  She does have lung cancer and is not currently getting treatment and has been told by her oncologist that they are not able to do surgery to remove it.  She has declined routine diabetic eye exam and bone density scan.  She is accompanied to her office visit today by her daughter who takes care of her.  She has additional chronic medical problems including hypertension, COPD, hypothyroidism, hyperlipidemia and type 2 diabetes.  She is dependent on supplemental oxygen and is wearing it today. Her A1c was checked today in office and it was 5.6 which is improved from 6.0. Discussed obtaining routine labs with patient and her daughter and it was decided to hold off on doing routine labs at this time and will order labs as needed in the future      Current Medication: Outpatient Encounter Medications as of 01/19/2021  Medication Sig   albuterol (VENTOLIN HFA) 108 (90 Base) MCG/ACT inhaler Inhale 2 puffs into the lungs every 6 (six) hours as needed for wheezing or shortness of breath.   glucose blood (ACCU-CHEK GUIDE) test strip Use as instructed to check blood sugars once daily DX: E11.65   ondansetron (ZOFRAN-ODT) 4 MG disintegrating tablet Take 1 tablet (4 mg total) by mouth every 8 (eight) hours as needed for nausea or vomiting.   OXYGEN Inhale into the lungs.   [DISCONTINUED] Accu-Chek FastClix Lancets MISC Use as directed to check sugars. DX E11.65   [DISCONTINUED] albuterol (PROAIR HFA) 108 (90 Base) MCG/ACT inhaler TAKE 2 PUFFS BY MOUTH EVERY 4 HOURS AS NEEDED   [DISCONTINUED]  atenolol (TENORMIN) 25 MG tablet Take 1 tablet (25 mg total) by mouth daily.   [DISCONTINUED] ciprofloxacin (CIPRO) 500 MG tablet Take 1 tablet (500 mg total) by mouth 2 (two) times daily.   [DISCONTINUED] clonazePAM (KLONOPIN) 1 MG tablet TAKE ONE TAB BY MOUTH TWICE DAILY FOR ANXIETY   [DISCONTINUED] fluconazole (DIFLUCAN) 150 MG tablet Take by mouth.   [DISCONTINUED] glimepiride (AMARYL) 1 MG tablet Take one tab dialy with breakfast   [DISCONTINUED] HYDROcodone-acetaminophen (NORCO/VICODIN) 5-325 MG tablet TAKE ONE (1) TABLET BY MOUTH TWO TIMES PER DAY AS NEEDED FOR MODERATE OR SEVEREPAIN   [DISCONTINUED] JANUMET 50-500 MG tablet Take 1 tablet by mouth daily.   [DISCONTINUED] losartan-hydrochlorothiazide (HYZAAR) 50-12.5 MG tablet Take 1 tablet by mouth daily.   [DISCONTINUED] pantoprazole (PROTONIX) 40 MG tablet Take 1 tablet (40 mg total) by mouth daily.   [DISCONTINUED] pneumococcal 20-valent conjugate vaccine (PREVNAR 20) 0.5 ML injection Inject 0.5 mLs into the muscle tomorrow at 10 am.   [DISCONTINUED] SitaGLIPtin-MetFORMIN HCl (JANUMET XR) 50-500 MG TB24 TAKE ONE TABLET EVERY DAY WITH FOOD FOR DIABETES   [DISCONTINUED] tiotropium (SPIRIVA HANDIHALER) 18 MCG inhalation capsule INHALE 1 CAPSULE VIA HANDIHALER ONCE DAILY AT THE SAME TIME EVERY DAY   Accu-Chek FastClix Lancets MISC Use as directed to check sugars once daily. DX E11.65   atenolol (TENORMIN) 25 MG tablet Take 1 tablet (25 mg total) by mouth daily.   clonazePAM (KLONOPIN) 1 MG tablet TAKE ONE TAB BY MOUTH  TWICE DAILY FOR ANXIETY   glimepiride (AMARYL) 1 MG tablet Take one tab dialy with breakfast   losartan-hydrochlorothiazide (HYZAAR) 50-12.5 MG tablet Take 1 tablet by mouth daily.   pantoprazole (PROTONIX) 40 MG tablet Take 1 tablet (40 mg total) by mouth daily.   [EXPIRED] pneumococcal 20-valent conjugate vaccine (PREVNAR 20) 0.5 ML injection Inject 0.5 mLs into the muscle once for 1 dose.   SitaGLIPtin-MetFORMIN HCl (JANUMET  XR) 50-500 MG TB24 TAKE ONE TABLET EVERY DAY WITH FOOD FOR DIABETES   tiotropium (SPIRIVA HANDIHALER) 18 MCG inhalation capsule INHALE 1 CAPSULE VIA HANDIHALER ONCE DAILY AT THE SAME TIME EVERY DAY   [DISCONTINUED] Accu-Chek FastClix Lancets MISC Use as directed to check sugars. DX E11.65   No facility-administered encounter medications on file as of 01/19/2021.    Surgical History: Past Surgical History:  Procedure Laterality Date   CESAREAN SECTION      Medical History: Past Medical History:  Diagnosis Date   Cancer ([place])    CHF (congestive heart failure) (HCC)    COPD (chronic obstructive pulmonary disease) ([place])    Depression    Diabetes mellitus without complication (HCC)    HOH (hard of hearing)    Hypertension     Family History: Family History  Problem Relation Age of Onset   Diabetes Father    Colon cancer Father    Colon cancer Mother    Aneurysm Mother 70       brain   Lung cancer Sister    Breast cancer Sister    Lung cancer Brother    Lung cancer Sister    Bone cancer Brother    Lung cancer Brother    Cancer Sister        type unknown    Social History   Socioeconomic History   Marital status: Single    Spouse name: Not on file   Number of children: 5   Years of education: Not on file   Highest education level: Not on file  Occupational History   Not on file  Tobacco Use   Smoking status: Former    Packs/day: 3.00    Years: 57.00    Pack years: 171.00    Types: Cigarettes    Quit date: 04/17/1998    Years since quitting: 22.8   Smokeless tobacco: Former    Types: Snuff  Vaping Use   Vaping Use: Never used  Substance and Sexual Activity   Alcohol use: No   Drug use: No   Sexual activity: Never  Other Topics Concern   Not on file  Social History Narrative   ** Merged History Encounter **       Social Determinants of Health   Financial Resource Strain: Low Risk    Difficulty of Paying Living Expenses: Not very hard  Food  Insecurity: Not on file  Transportation Needs: Not on file  Physical Activity: Not on file  Stress: Not on file  Social Connections: Not on file  Intimate Partner Violence: Not on file      Review of Systems  Constitutional:  Negative for activity change, appetite change, chills, fatigue, fever and unexpected weight change.  HENT: Negative.  Negative for congestion, ear pain, rhinorrhea, sore throat and trouble swallowing.   Eyes: Negative.   Respiratory: Negative.  Negative for cough, chest tightness, shortness of breath and wheezing.   Cardiovascular: Negative.  Negative for chest pain.  Gastrointestinal: Negative.  Negative for abdominal pain, blood in stool, constipation, diarrhea, nausea and  vomiting.  Endocrine: Negative.   Genitourinary: Negative.  Negative for difficulty urinating, dysuria, frequency, hematuria and urgency.  Musculoskeletal: Negative.  Negative for arthralgias, back pain, joint swelling, myalgias and neck pain.  Skin: Negative.  Negative for rash and wound.  Allergic/Immunologic: Negative.  Negative for immunocompromised state.  Neurological: Negative.  Negative for dizziness, seizures, numbness and headaches.  Hematological: Negative.   Psychiatric/Behavioral:  Positive for depression. Negative for behavioral problems, self-injury and suicidal ideas. The patient is not nervous/anxious.    Vital Signs: BP 127/60   Pulse 94   Temp 99 F (37.2 C)   Ht 5' (1.524 m)   Wt 167 lb (75.8 kg)   SpO2 98%   BMI 32.61 kg/m    Physical Exam Vitals reviewed.  Constitutional:      General: She is awake. She is not in acute distress.    Appearance: Normal appearance. She is well-developed, well-groomed and normal weight. She is not ill-appearing or diaphoretic.     Interventions: Nasal cannula in place.  HENT:     Head: Normocephalic and atraumatic.     Right Ear: Tympanic membrane, ear canal and external ear normal.     Left Ear: Tympanic membrane, ear canal  and external ear normal.     Nose: Nose normal. No congestion or rhinorrhea.     Mouth/Throat:     Mouth: Mucous membranes are moist.     Pharynx: Oropharynx is clear. No oropharyngeal exudate or posterior oropharyngeal erythema.  Eyes:     General: No scleral icterus.       Right eye: No discharge.        Left eye: No discharge.     Extraocular Movements: Extraocular movements intact.     Conjunctiva/sclera: Conjunctivae normal.     Pupils: Pupils are equal, round, and reactive to light.  Neck:     Thyroid: No thyromegaly.     Vascular: No JVD.     Trachea: No tracheal deviation.  Cardiovascular:     Rate and Rhythm: Normal rate and regular rhythm.     Pulses: Normal pulses.     Heart sounds: Normal heart sounds. No murmur heard.   No friction rub. No gallop.  Pulmonary:     Effort: Pulmonary effort is normal. No respiratory distress.     Breath sounds: Normal breath sounds. No stridor. No wheezing or rales.  Chest:     Chest wall: No tenderness.     Comments: Declined clinical breast exam Abdominal:     General: Bowel sounds are normal. There is no distension.     Palpations: Abdomen is soft. There is no mass.     Tenderness: There is no abdominal tenderness. There is no guarding or rebound.  Musculoskeletal:        General: No tenderness or deformity. Normal range of motion.     Cervical back: Normal range of motion and neck supple.  Lymphadenopathy:     Cervical: No cervical adenopathy.  Skin:    General: Skin is warm and dry.     Capillary Refill: Capillary refill takes less than 2 seconds.     Coloration: Skin is not pale.     Findings: No erythema or rash.  Neurological:     Mental Status: She is alert and oriented to person, place, and time.     Cranial Nerves: No cranial nerve deficit.     Motor: No abnormal muscle tone.     Coordination: Coordination normal.  Gait: Gait normal.     Deep Tendon Reflexes: Reflexes are normal and symmetric.  Psychiatric:         Mood and Affect: Mood normal.        Behavior: Behavior normal. Behavior is cooperative.        Thought Content: Thought content normal.        Judgment: Judgment normal.       Assessment/Plan: 1. Encounter for general adult medical examination with abnormal findings Age-appropriate preventive screenings and vaccinations discussed, annual physical exam completed. Routine labs for health maintenance declined. PHM updated.  Patient and daughter agreed to her getting the pneumonia vaccine which was sent to her pharmacy.  She has declined any other preventive screenings.  2. Type 2 diabetes mellitus with hyperglycemia, without long-term current use of insulin (HCC) Her A1c is within normal range at 5.6, she continues to take glimepiride and Janumet XR, refills ordered. - POCT HgB A1C - glimepiride (AMARYL) 1 MG tablet; Take one tab dialy with breakfast  Dispense: 90 tablet; Refill: 3 - SitaGLIPtin-MetFORMIN HCl (JANUMET XR) 50-500 MG TB24; TAKE ONE TABLET EVERY DAY WITH FOOD FOR DIABETES  Dispense: 90 tablet; Refill: 3 - Accu-Chek FastClix Lancets MISC; Use as directed to check sugars once daily. DX E11.65  Dispense: 306 each; Refill: 3 - glucose blood (ACCU-CHEK GUIDE) test strip; Use as instructed to check blood sugars once daily DX: E11.65  Dispense: 100 each; Refill: 12  3. Chronic obstructive pulmonary disease, unspecified COPD type ([place]) She has chronic COPD and is dependent on supplemental oxygen, she does have a rescue inhaler as needed and is on Spiriva for maintenance. - tiotropium (SPIRIVA HANDIHALER) 18 MCG inhalation capsule; INHALE 1 CAPSULE VIA HANDIHALER ONCE DAILY AT THE SAME TIME EVERY DAY  Dispense: 90 capsule; Refill: 3 - albuterol (VENTOLIN HFA) 108 (90 Base) MCG/ACT inhaler; Inhale 2 puffs into the lungs every 6 (six) hours as needed for wheezing or shortness of breath.  Dispense: 8 g; Refill: 5  4. Essential hypertension Her blood pressure is well controlled with  current medications, refills ordered. - losartan-hydrochlorothiazide (HYZAAR) 50-12.5 MG tablet; Take 1 tablet by mouth daily.  Dispense: 90 tablet; Refill: 3 - atenolol (TENORMIN) 25 MG tablet; Take 1 tablet (25 mg total) by mouth daily.  Dispense: 90 tablet; Refill: 3  5. Malignant neoplasm of upper lobe of left lung [facility]) She is being followed by Dr. Janese Banks for this problem, she has had SBRT but according to her last office visit when she saw the nurse practitioner be lesion in the left upper lobe looked like it could be larger than previous imaging.  She will continue to follow-up with Dr. Elroy Channel office for further monitoring and discussion of treatment.  6. O2 dependent Patient wears continuous portable oxygen, 2 L/min via nasal cannula  7. Pancreatic mass A 4.6 cm pancreatic mass was found in the pancreatic tail and she is being followed by Dr. Vicente Males gastroenterology.  She has been informed that she is high risk for any surgery or anesthesia due to her comorbidities and no additional work-up for this problem was recommended by Dr. Vicente Males.  8. Dysuria Routine urinalysis done. - UA/M w/rflx Culture, Routine - Microscopic Examination - Urine Culture, Reflex  9. Generalized anxiety disorder Stable, refills ordered. - clonazePAM (KLONOPIN) 1 MG tablet; TAKE ONE TAB BY MOUTH TWICE DAILY FOR ANXIETY  Dispense: 60 tablet; Refill: 2  10. Need for vaccination - pneumococcal 20-valent conjugate vaccine (PREVNAR 20) 0.5 ML injection; Inject  0.5 mLs into the muscle once for 1 dose.  Dispense: 0.5 mL; Refill: 0      General Counseling: larkin alfred understanding of the findings of todays visit and agrees with plan of treatment. I have discussed any further diagnostic evaluation that may be needed or ordered today. We also reviewed her medications today. she has been encouraged to call the office with any questions or concerns that should arise related to todays visit.    Orders Placed This  Encounter  Procedures   Microscopic Examination   Urine Culture, Reflex   UA/M w/rflx Culture, Routine   POCT HgB A1C    Meds ordered this encounter  Medications   pneumococcal 20-valent conjugate vaccine (PREVNAR 20) 0.5 ML injection    Sig: Inject 0.5 mLs into the muscle once for 1 dose.    Dispense:  0.5 mL    Refill:  0   DISCONTD: Accu-Chek FastClix Lancets MISC    Sig: Use as directed to check sugars. DX E11.65    Dispense:  306 each    Refill:  3   glimepiride (AMARYL) 1 MG tablet    Sig: Take one tab dialy with breakfast    Dispense:  90 tablet    Refill:  3    This is lower dose   losartan-hydrochlorothiazide (HYZAAR) 50-12.5 MG tablet    Sig: Take 1 tablet by mouth daily.    Dispense:  90 tablet    Refill:  3    FOR NEXT TIME   pantoprazole (PROTONIX) 40 MG tablet    Sig: Take 1 tablet (40 mg total) by mouth daily.    Dispense:  90 tablet    Refill:  3   SitaGLIPtin-MetFORMIN HCl (JANUMET XR) 50-500 MG TB24    Sig: TAKE ONE TABLET EVERY DAY WITH FOOD FOR DIABETES    Dispense:  90 tablet    Refill:  3   atenolol (TENORMIN) 25 MG tablet    Sig: Take 1 tablet (25 mg total) by mouth daily.    Dispense:  90 tablet    Refill:  3   clonazePAM (KLONOPIN) 1 MG tablet    Sig: TAKE ONE TAB BY MOUTH TWICE DAILY FOR ANXIETY    Dispense:  60 tablet    Refill:  2   tiotropium (SPIRIVA HANDIHALER) 18 MCG inhalation capsule    Sig: INHALE 1 CAPSULE VIA HANDIHALER ONCE DAILY AT THE SAME TIME EVERY DAY    Dispense:  90 capsule    Refill:  3   albuterol (VENTOLIN HFA) 108 (90 Base) MCG/ACT inhaler    Sig: Inhale 2 puffs into the lungs every 6 (six) hours as needed for wheezing or shortness of breath.    Dispense:  8 g    Refill:  5   Accu-Chek FastClix Lancets MISC    Sig: Use as directed to check sugars once daily. DX E11.65    Dispense:  306 each    Refill:  3   glucose blood (ACCU-CHEK GUIDE) test strip    Sig: Use as instructed to check blood sugars once daily DX:  E11.65    Dispense:  100 each    Refill:  12     Return in about 6 months (around 07/20/2021) for F/U, Recheck A1C, Ayodeji Keimig PCP.   Total time spent:30 Minutes Time spent includes review of chart, medications, test results, and follow up plan with the patient.   [place] Controlled Substance Database was reviewed by me.  This patient was  seen by Jonetta Osgood, FNP-C in collaboration with Dr. Clayborn Bigness as a part of collaborative care agreement.  Rylinn Linzy R. Valetta Fuller, MSN, FNP-C Internal medicine

## 2021-01-21 ENCOUNTER — Ambulatory Visit: Payer: Self-pay | Admitting: Student-PharmD

## 2021-01-21 DIAGNOSIS — I10 Essential (primary) hypertension: Secondary | ICD-10-CM

## 2021-01-21 DIAGNOSIS — E1165 Type 2 diabetes mellitus with hyperglycemia: Secondary | ICD-10-CM

## 2021-01-21 NOTE — Progress Notes (Signed)
Diabetes (DM) Review Call   Nessler,Anairis  [id number] 33 years, Female  DOB: January 14, 1941  M: [phone]  Diabetes (DM) Review  Completed by Charlann Lange on 01/21/21  Chart Review Is the patient enrolled in RPM with the glucometer?: No A1C Reading #1 (last): 5.6  on: 01/19/2021 A1C Reading #2: 6.0 on: 08/19/2020 What recent interventions/DTPs have been made by any provider to improve the patient's conditions in the last 3 months?: 01/19/21 Jonetta Osgood, NP.  For Type 2 diabetes mellitus/Medicare wellness. Pneumococcal vaccine injection given. CHANGED Albuterol 108 MCG/ACT 2 puffs every 6 hours PRN. STOPPED Cipro and Fluconazole.  Any recent hospitalizations or ED visits since last visit with CPP?: No Is the patient currently on a STATIN medication?: No Does patient have any of the following diagnosis codes that will exclude them from statin therapy?: Other Details of why Patient is not taking a Statin: N/A Is the patient currently on ACE/ARB medication?: Yes  Adherence Review Adherence rates for STAR metric medications:  Glimepiride 1 mg  10/22/20 90 DS  Losartan-Hydrochlorothiazide 50-12.5 mg 10/22/20 90 DS Sitagliptin-Metformin 50-500 mg  01/05/21 90 DS   Adherence rates for medications indicated for disease state being reviewed:  Sitagliptin-Metformin 50-500 mg 01/05/21 90 DS   Glimepiride 1 mg 10/22/20 90 DS  Does the patient have >5 day gap between last estimated fill dates for any of the above medications?: No  Disease State Questions Able to connect with the Patient?: Yes Are you currently checking your blood sugars?: Yes How often are you checking your blood sugar?: occasionally Recent fasting blood sugars: 100-185 Is the patient having any lows <70?: No Is the patient having any fasting blood sugars above >170?: No Is the patient checking their feet daily/regularly?: Yes Are there any cuts, swelling, blisters, redness, or any signs of infections?: No When was your  last dentist appointment? (6 Month recommendation): Patient has not been to years  What diet changes have you made to improve your Blood Sugar level?: eating more fruits and vegetables, eating more home-cooked meals What exercise are you doing to improve your Blood Sugar level?: no formal exercise, other Details: Sitting exercising   Pharmacist Review  Adherence gaps identified?: No Drug Therapy Problems identified?: No Assessment: Controlled  7 minutes spent in review, coordination, and documentation.  Reviewed by: Alena Bills, PharmD Clinical Pharmacist [phone]

## 2021-01-24 LAB — UA/M W/RFLX CULTURE, ROUTINE
Bilirubin, UA: NEGATIVE
Glucose, UA: NEGATIVE
Nitrite, UA: NEGATIVE
RBC, UA: NEGATIVE
Specific Gravity, UA: 1.023 (ref 1.005–1.030)
Urobilinogen, Ur: 1 mg/dL (ref 0.2–1.0)
pH, UA: 6 (ref 5.0–7.5)

## 2021-01-24 LAB — MICROSCOPIC EXAMINATION
Bacteria, UA: NONE SEEN
Casts: NONE SEEN /lpf
RBC, Urine: NONE SEEN /hpf (ref 0–2)

## 2021-01-24 LAB — URINE CULTURE, REFLEX

## 2021-02-04 DIAGNOSIS — J449 Chronic obstructive pulmonary disease, unspecified: Secondary | ICD-10-CM | POA: Diagnosis not present

## 2021-02-07 ENCOUNTER — Other Ambulatory Visit: Payer: Self-pay | Admitting: Nurse Practitioner

## 2021-02-07 DIAGNOSIS — G894 Chronic pain syndrome: Secondary | ICD-10-CM

## 2021-02-10 NOTE — Telephone Encounter (Signed)
Med sent to pharmacy.

## 2021-02-11 ENCOUNTER — Encounter: Payer: Self-pay | Admitting: Nurse Practitioner

## 2021-02-13 DIAGNOSIS — E1165 Type 2 diabetes mellitus with hyperglycemia: Secondary | ICD-10-CM | POA: Diagnosis not present

## 2021-02-13 DIAGNOSIS — I10 Essential (primary) hypertension: Secondary | ICD-10-CM | POA: Diagnosis not present

## 2021-02-13 DIAGNOSIS — J449 Chronic obstructive pulmonary disease, unspecified: Secondary | ICD-10-CM | POA: Diagnosis not present

## 2021-03-04 ENCOUNTER — Ambulatory Visit: Payer: Medicare Other | Admitting: Student-PharmD

## 2021-03-04 ENCOUNTER — Other Ambulatory Visit: Payer: Self-pay

## 2021-03-04 DIAGNOSIS — I10 Essential (primary) hypertension: Secondary | ICD-10-CM

## 2021-03-04 DIAGNOSIS — J449 Chronic obstructive pulmonary disease, unspecified: Secondary | ICD-10-CM

## 2021-03-04 NOTE — Progress Notes (Signed)
Follow Up Pharmacist Visit    Lynn Robbins, Lynn Robbins  [id number] 64 years, Female  DOB: Apr 21, 1940  M: [phone]  Clinical Summary Situation: Patient presents with son via telephone for CCM F/U visit Background: Patient has no health concerns at today's visit but does need her albuterol inhaler refilled, as she is using it the max number of times per day and is regularly using her home oxygen and her Spiriva daily. Patient's BP is well-controlled at office visits. Assessment: Patient's BP is well-controlled. Patient breathing is controlled with use of her inhalers and oxygen as directed. Recommendations: -Continue current medication therapy -Contact PCP if breathing worsens  Pre-Call Questions  Completed by Charlann Lange on 03/02/2021  Are you able to connect with Patient?: Yes Visit Type: Phone Call May we confirm what is the best phone # for the pharmacist to call you?: [phone]  Have you been in the hospital or emergency room recently?: No Do you have any questions or concerns that you want to make sure your pharmacist addresses with your during your appointment?: No What, if any, problems do you have getting your medications from the pharmacy?: None Is there anything else that you would like me to pass along to your pharmacist or PCP?: No Patient reminded to bring medication bottles to visit (not a list of meds) OR have medication bottles pulled out prior to appointment time: Done  Patient Chart Prep   Chronic Conditions Patient's Chronic Conditions: Hypertension (HTN), Diabetes (DM), Hyperlipidemia/Dyslipidemia (HLD), Chronic Obstructive Pulmonary Disease (COPD), Anxiety  Doctor and Hospital Visits Were there PCP Visits since last visit with the Pharmacist?: No Were there Specialist Visits since last visit with the Pharmacist?: No Was there a Hospital Visit in last 30 days?: No Were there other Hospital Visits since last visit with the Pharmacist?: No  Medication  Information Have there been any medication changes from PCP or Specialist since last visit with the Pharmacist?: No Are there any Medication adherence gaps (beyond 5 days past due)?: No Medication adherence rates for the STAR rating drugs: Glimepiride 1 mg 01/19/21 90 DS Losartan-Hydrochlorothiazide 50-12.5 mg 01/19/21 90 DS Sitagliptin-Metformin 50-500 mg  01/05/21 90 DS List Patient's current Care Gaps: Need Eye Exam Documented in EHR or by Claim  Disease Assessments  Subjective Information Current BP: 127/60 Current HR: 94 taken on: 01/19/2021 Weight: 167 BMI: 32.61 Last GFR: 59 taken on: 04/09/2020 Why did the patient present?: CCM F/U Visit Is Patient using [facility]?: No Name and location of Current pharmacy: [facility] Current Rx insurance plan: [employer] Are meds synced by current pharmacy?: No Are meds delivered by current pharmacy?: No - delivery available but patient prefers to not use Would patient benefit from direct intervention of clinical lead in dispensing process to optimize clinical outcomes?: Yes Are [facility] pharmacy services available where patient lives?: Yes Is patient disadvantaged to use [facility]?: No [facility] services reviewed with patient and patient wishes to change pharmacy?: No Select reason patient declined to change pharmacies: Loyalty to current pharmacy Does patient experience delays in picking up medications due to transportation concerns (getting to pharmacy)?: No Any additional demeanor/mood notes?: Spoke with patient's son while patient was present. Patient is hard of hearing over the phone.  Hypertension (HTN) Assess this condition today?: Yes Is patient able to obtain BP reading today?: No Goal: <130/80 mmHG Hypertension Stage: Elevated (SBP: 120-129 and DBP < 80) Is Patient checking BP at home?: Yes Patient home BP readings are ranging: No log to report How often  does patient miss taking their blood pressure  medications?: None Has patient experienced hypotension, dizziness, falls or bradycardia?: No Does Patient use RPM device?: No BP RPM device: Does patient qualify?: No We discussed: DASH diet:  following a diet emphasizing fruits and vegetables and low-fat dairy products along with whole grains, fish, poultry, and nuts. Reducing red meats and sugars., Reducing the amount of salt intake to 1500mg /per day. Assessment:: Controlled Drug: Atenolol 25mg  1 tablet daily Assessment: Appropriate, Effective, Safe, Accessible Drug: Losartan-Hydrochlorothiazide 50-12.5mg  1 tablet daily Assessment: Appropriate, Effective, Safe, Accessible Plan to (other): Continue current medication therapy HC Follow up: 2 months Pharmacist Follow up: 5 months  Chronic Obstructive Pulmonary Disease (COPD) Current Eosinophils: 1 taken on: 04/09/2020 Assess this condition today?: Yes Gold group: A (low sx, < 2 exacerbations / yr) Exacerbations since last visit with pharmacist?: No Has there been change in patients smoking/vaping habit since last visit with pharmacist?: No Home oxygen therapy: Yes Details: At night Frequency of SABA/SAMA use: Multiple times per day We discussed: Inhaler technique Assessment:: Controlled Drug: Spiriva 28mcg 1 puff daily Assessment: Appropriate, Effective, Safe, Accessible Drug: Albuterol HFA 2 puffs every 6 hours as needed Assessment: Appropriate, Effective, Safe, Accessible Drug: Oxygen Assessment: Appropriate, Effective, Safe, Accessible Additional Info: Patient is using her 2 puffs every 6 hours each day. States this and oxygen with Spiriva help to control her breathing. One inhaler lasts 25 days; contacted pharmacy on behalf of patient and requested 2 inhalers to be filled per month. Plan to: Continue medication therapy. HC Follow up: 2 months Pharmacist Follow up: 5 months  Exercise, Diet and Non-Drug Coordination Needs Additional exercise counseling points. We discussed:  targeting at least 151 minutes per week of moderate-intensity aerobic exercise. Additional diet counseling points. We discussed: key components of the DASH diet Discussed Non-Drug Care Coordination Needs: Yes Does Patient have Medication financial barriers?: No  Accountable Health Communities Health-Related Social Needs Screening Tool -  SDOH  What is your living situation today? (ref #1): I have a steady place to live Think about the place you live. Do you have problems with any of the following? (ref #2): None of the above Within the past 12 months, you worried that your food would run out before you got money to buy more (ref #3): Never true Within the past 12 months, the food you bought just didn't last and you didn't have money to get more (ref #4): Never true In the past 12 months, has lack of reliable transportation kept you from medical appointments, meetings, work or from getting things needed for daily living? (ref #5): No In the past 12 months, has the electric, gas, oil, or water company threatened to shut off services in your home? (ref #6): No How often does anyone, including family and friends, physically hurt you? (ref #7): Never (1) How often does anyone, including family and friends, insult or talk down to you? (ref #8): Never (1) How often does anyone, including friends and family, threaten you with harm? (ref #9): Never (1) How often does anyone, including family and friends, scream or curse at you? (ref #10): Never (1)  COPD and Physical Activity Chronic obstructive pulmonary disease (COPD) is a long-term, or chronic, condition that affects the lungs. COPD is a general term that can be used to describe many problems that cause inflammation of the lungs and limit airflow. These conditions include chronic bronchitis and emphysema. The main symptom of COPD is shortness of breath, which makes it harder to  do even simple tasks. This can also make it harder to exercise and stay  active. Talk with your health care provider about treatments to help you breathe better and actions you can take to prevent breathing problems during physical activity. What are the benefits of exercising when you have COPD? Exercising regularly is an important part of a healthy lifestyle. You can still exercise and do physical activities even though you have COPD. Exercise and physical activity improve your shortness of breath by increasing blood flow (circulation). This causes your heart to pump more oxygen through your body. Moderate exercise can: Improve oxygen use. Increase your energy level. Help with shortness of breath. Strengthen your breathing muscles. Improve heart health. Help with sleep. Improve your self-esteem and feelings of self-worth. Lower depression, stress, and anxiety. Exercise can benefit everyone with COPD. The severity of your disease may affect how hard you can exercise, especially at first, but everyone can benefit. Talk with your health care provider about how much exercise is safe for you, and which activities and exercises are safe for you. What actions can I take to prevent breathing problems during physical activity? Sign up for a pulmonary rehabilitation program. This type of program may include: Education about lung diseases. Exercise classes that teach you how to exercise and be more active while improving your breathing. This usually involves: Exercise using your lower extremities, such as a stationary bicycle. About 30 minutes of exercise, 2 to 5 times per week, for 6 to 12 weeks. Strength training, such as push-ups or leg lifts. Nutrition education. Group classes in which you can talk with others who also have COPD and learn ways to manage stress. If you use an oxygen tank, you should use it while you exercise. Work with your health care provider to adjust your oxygen for your physical activity. Your resting flow rate is different from your flow rate during  physical activity. How to manage your breathing while exercising While you are exercising: Take slow breaths. Pace yourself, and do nottry to go too fast. Purse your lips while breathing out. Pursing your lips is similar to a kissing or whistling position. If doing exercise that uses a quick burst of effort, such as weight lifting: Breathe in before starting the exercise. Breathe out during the hardest part of the exercise, such as raising the weights. Where to find support You can find support for exercising with COPD from: Your health care provider. A pulmonary rehabilitation program. Your local health department or community health programs. Support groups, either online or in-person. Your health care provider may be able to recommend support groups. Where to find more information You can find more information about exercising with COPD from: American Lung Association: lung.org COPD Foundation: copdfoundation.org Contact a health care provider if: Your symptoms get worse. You have nausea. You have a fever. You want to start a new exercise program or a new activity. Get help right away if: You have chest pain. You cannot breathe. These symptoms may represent a serious problem that is an emergency. Do not wait to see if the symptoms will go away. Get medical help right away. Call your local emergency services (911 in the [place] Do not drive yourself to the hospital. Summary COPD is a general term that can be used to describe many different lung problems that cause lung inflammation and limit airflow. This includes chronic bronchitis and emphysema. Exercise and physical activity improve your shortness of breath by increasing blood flow (circulation). This causes your  heart to provide more oxygen to your body. Contact your health care provider before starting any exercise program or new activity. Ask your health care provider what exercises and activities are safe for you. This  information is not intended to replace advice given to you by your health care provider. Make sure you discuss any questions you have with your health care provider.      Alena Bills Clinical Pharmacist [phone]

## 2021-03-07 DIAGNOSIS — J449 Chronic obstructive pulmonary disease, unspecified: Secondary | ICD-10-CM | POA: Diagnosis not present

## 2021-03-11 ENCOUNTER — Other Ambulatory Visit: Payer: Self-pay | Admitting: Nurse Practitioner

## 2021-03-11 DIAGNOSIS — G894 Chronic pain syndrome: Secondary | ICD-10-CM

## 2021-03-15 DIAGNOSIS — E1165 Type 2 diabetes mellitus with hyperglycemia: Secondary | ICD-10-CM | POA: Diagnosis not present

## 2021-03-15 DIAGNOSIS — J449 Chronic obstructive pulmonary disease, unspecified: Secondary | ICD-10-CM | POA: Diagnosis not present

## 2021-03-15 DIAGNOSIS — I10 Essential (primary) hypertension: Secondary | ICD-10-CM | POA: Diagnosis not present

## 2021-04-07 DIAGNOSIS — J449 Chronic obstructive pulmonary disease, unspecified: Secondary | ICD-10-CM | POA: Diagnosis not present

## 2021-04-10 ENCOUNTER — Telehealth: Payer: Medicare Other

## 2021-04-13 ENCOUNTER — Other Ambulatory Visit: Payer: Self-pay | Admitting: Internal Medicine

## 2021-04-13 DIAGNOSIS — G894 Chronic pain syndrome: Secondary | ICD-10-CM

## 2021-04-17 NOTE — Telephone Encounter (Signed)
Med sent to pharmacy.

## 2021-04-24 ENCOUNTER — Telehealth: Payer: Self-pay | Admitting: Student-PharmD

## 2021-04-24 NOTE — Progress Notes (Signed)
COPD Review Call  ? Lynn Robbins, STAIR [id number] ?18 years, Female  DOB: 1940/09/16  M: [phone] ? ?COPD Review (HC) ?Completed by Charlann Lange on 04/21/2021 12:02 PM ? ?Chart Review ?Is the patient enrolled in RPM with [employer]?: No ? ?Eosinophil Count (last): 1 ?on: 04/09/2020 ? ?Last CAT Score: Unkown ?on: 04/21/2021 ? ?What recent interventions have been made by any provider to improve the patient's conditions in the last 3 months?: None. ? ?Any recent hospitalizations or ED visits since last visit with CPP?: No ? ?Adherence Review ?Adherence rates for STAR metric medications:  ?Glimepiride 1 mg 04/13/21 90 DS ?Losartan-Hydrochlorothiazide 50-12.5 mg 04/13/21 90 DS ?Janumet ER 50-500 mg 03/30/21 90 DS ? ?Adherence rates for medications indicated for disease state being reviewed: None. ? ?Does the patient have >5 day gap between last estimated fill dates for any of the above medications?: No ? ?Disease State Questions ?Able to connect with the Patient?: No ? ?Misc. Response/Information:: [place] 04/21/21-Unable to LVM 04/22/21-04/23/21-04/24/21 ? ?Engagement Notes ?Charlann Lange on 04/21/2021 12:07 PM ?[facility] Chart Review: 10 min 04/21/21-5 min 04/24/21 ?HC Assessment call time spent: ?CPP Office Visit Documentation: ?CPP Coordination of Care: ?CPP Care Plan Review: ? ?Charlann Lange, RMA ?Healthcare Concierge  ?[phone] ? ?

## 2021-05-05 DIAGNOSIS — J449 Chronic obstructive pulmonary disease, unspecified: Secondary | ICD-10-CM | POA: Diagnosis not present

## 2021-05-14 ENCOUNTER — Other Ambulatory Visit: Payer: Self-pay | Admitting: Nurse Practitioner

## 2021-05-14 DIAGNOSIS — G894 Chronic pain syndrome: Secondary | ICD-10-CM

## 2021-05-14 DIAGNOSIS — F411 Generalized anxiety disorder: Secondary | ICD-10-CM

## 2021-05-20 ENCOUNTER — Ambulatory Visit
Admission: RE | Admit: 2021-05-20 | Discharge: 2021-05-20 | Disposition: A | Discharge status: Home / Self Care | Payer: Medicare Other | Source: Ambulatory Visit | Attending: Nurse Practitioner | Admitting: Nurse Practitioner

## 2021-05-20 ENCOUNTER — Other Ambulatory Visit: Payer: Self-pay

## 2021-05-20 DIAGNOSIS — C3490 Malignant neoplasm of unspecified part of unspecified bronchus or lung: Secondary | ICD-10-CM | POA: Diagnosis not present

## 2021-05-20 DIAGNOSIS — C3412 Malignant neoplasm of upper lobe, left bronchus or lung: Secondary | ICD-10-CM | POA: Diagnosis not present

## 2021-05-20 DIAGNOSIS — J439 Emphysema, unspecified: Secondary | ICD-10-CM | POA: Diagnosis not present

## 2021-05-22 ENCOUNTER — Inpatient Hospital Stay: Payer: Medicare Other | Admitting: Oncology

## 2021-05-25 ENCOUNTER — Other Ambulatory Visit: Payer: Self-pay | Admitting: *Deleted

## 2021-05-25 ENCOUNTER — Inpatient Hospital Stay: Payer: Medicare Other | Attending: Oncology | Admitting: Oncology

## 2021-05-25 ENCOUNTER — Other Ambulatory Visit: Payer: Self-pay | Admitting: Oncology

## 2021-05-25 ENCOUNTER — Encounter: Payer: Self-pay | Admitting: Oncology

## 2021-05-25 DIAGNOSIS — Z08 Encounter for follow-up examination after completed treatment for malignant neoplasm: Secondary | ICD-10-CM

## 2021-05-25 DIAGNOSIS — Z85118 Personal history of other malignant neoplasm of bronchus and lung: Secondary | ICD-10-CM

## 2021-05-25 DIAGNOSIS — C3412 Malignant neoplasm of upper lobe, left bronchus or lung: Secondary | ICD-10-CM

## 2021-05-25 NOTE — Progress Notes (Signed)
I connected with Lynn Robbins on 05/25/21 at  8:30 AM EDT by video enabled telemedicine visit and verified that I am speaking with the correct person using two identifiers. ?  ?I discussed the limitations, risks, security and privacy concerns of performing an evaluation and management service by telemedicine and the availability of in-person appointments. I also discussed with the patient that there may be a patient responsible charge related to this service. The patient expressed understanding and agreed to proceed. ? ?Other persons participating in the visit and their role in the encounter:  patients son ? ?Patient's location:  home ?Provider's location:  work ? ?Chief Complaint: Discuss CT scan results and further management ? ?History of present illness: patient is a 81 year old female with a past medical history significant for COPD for which she is on continuous oxygen 2 L, hypertension, CHF who presented to the ER on 04/10/2018 with some symptoms of chest wall pain which led to CT chest and CT abdomen.  CT scan showed left upper lobe spiculated mass highly concerning for primary lung cancer.  There was also a second nodule in the right upper lobe which may represent synchronous tumor.  No significant mediastinal adenopathy.  This was followed by a PET CT scan on 04/14/2018 which showed hypermetabolic left upper lobe lung mass 3.5 x 2.3 cm with an SUV of 13.5.  Second nodule in the right upper lobe measuring 14 mm which did not show any hypermetabolism.  No hypermetabolic mediastinal lymph nodes.  Patient received SBRT to that the lesion ?  ? ?Interval history patient does not smoke anymore.  She reports bruising in her bilateral forearms and lower extremities without any apparent fall.  She has not had any recent hospitalizations for her COPD. ? ? ?Review of Systems  ?Constitutional:  Positive for malaise/fatigue. Negative for chills, fever and weight loss.  ?HENT:  Negative for congestion, ear discharge and  nosebleeds.   ?Eyes:  Negative for blurred vision.  ?Respiratory:  Positive for shortness of breath. Negative for cough, hemoptysis, sputum production and wheezing.   ?Cardiovascular:  Negative for chest pain, palpitations, orthopnea and claudication.  ?Gastrointestinal:  Negative for abdominal pain, blood in stool, constipation, diarrhea, heartburn, melena, nausea and vomiting.  ?Genitourinary:  Negative for dysuria, flank pain, frequency, hematuria and urgency.  ?Musculoskeletal:  Negative for back pain, joint pain and myalgias.  ?Skin:  Negative for rash.  ?Neurological:  Negative for dizziness, tingling, focal weakness, seizures, weakness and headaches.  ?Endo/Heme/Allergies:  Does not bruise/bleed easily.  ?Psychiatric/Behavioral:  Negative for depression and suicidal ideas. The patient does not have insomnia.   ? ?No Known Allergies ? ?Past Medical History:  ?Diagnosis Date  ? Cancer [facility])   ? CHF (congestive heart failure) ([place])   ? COPD (chronic obstructive pulmonary disease) ([place])   ? Depression   ? Diabetes mellitus without complication ([place])   ? HOH (hard of hearing)   ? Hypertension   ? ? ?Past Surgical History:  ?Procedure Laterality Date  ? CESAREAN SECTION    ? ? ?Social History  ? ?Socioeconomic History  ? Marital status: Single  ?  Spouse name: Not on file  ? Number of children: 5  ? Years of education: Not on file  ? Highest education level: Not on file  ?Occupational History  ? Not on file  ?Tobacco Use  ? Smoking status: Former  ?  Packs/day: 3.00  ?  Years: 57.00  ?  Pack years: 171.00  ?  Types:  Cigarettes  ?  Quit date: 04/17/1998  ?  Years since quitting: 23.1  ? Smokeless tobacco: Former  ?  Types: Snuff  ?Vaping Use  ? Vaping Use: Never used  ?Substance and Sexual Activity  ? Alcohol use: No  ? Drug use: No  ? Sexual activity: Never  ?Other Topics Concern  ? Not on file  ?Social History Narrative  ? ** Merged History Encounter **  ?    ? ?Social Determinants of Health  ? ?Financial Resource  Strain: Low Risk   ? Difficulty of Paying Living Expenses: Not very hard  ?Food Insecurity: Not on file  ?Transportation Needs: Not on file  ?Physical Activity: Not on file  ?Stress: Not on file  ?Social Connections: Not on file  ?Intimate Partner Violence: Not on file  ? ? ?Family History  ?Problem Relation Age of Onset  ? Diabetes Father   ? Colon cancer Father   ? Colon cancer Mother   ? Aneurysm Mother 68  ?     brain  ? Lung cancer Sister   ? Breast cancer Sister   ? Lung cancer Brother   ? Lung cancer Sister   ? Bone cancer Brother   ? Lung cancer Brother   ? Cancer Sister   ?     type unknown  ? ? ? ?Current Outpatient Medications:  ?  Accu-Chek FastClix Lancets MISC, Use as directed to check sugars once daily. DX E11.65, Disp: 306 each, Rfl: 3 ?  albuterol (VENTOLIN HFA) 108 (90 Base) MCG/ACT inhaler, Inhale 2 puffs into the lungs every 6 (six) hours as needed for wheezing or shortness of breath., Disp: 8 g, Rfl: 5 ?  atenolol (TENORMIN) 25 MG tablet, Take 1 tablet (25 mg total) by mouth daily., Disp: 90 tablet, Rfl: 3 ?  clonazePAM (KLONOPIN) 1 MG tablet, TAKE 1 TABLET BY MOUTH TWICE DAILY AS NEEDED FOR ANXIETY, Disp: 60 tablet, Rfl: 0 ?  glimepiride (AMARYL) 1 MG tablet, Take one tab dialy with breakfast, Disp: 90 tablet, Rfl: 3 ?  glucose blood (ACCU-CHEK GUIDE) test strip, Use as instructed to check blood sugars once daily DX: E11.65, Disp: 100 each, Rfl: 12 ?  HYDROcodone-acetaminophen (NORCO/VICODIN) 5-325 MG tablet, TAKE ONE (1) TABLET BY MOUTH TWO TIMES PER DAY AS NEEDED FOR MODERATE OR SEVEREPAIN, Disp: 60 tablet, Rfl: 0 ?  losartan-hydrochlorothiazide (HYZAAR) 50-12.5 MG tablet, Take 1 tablet by mouth daily., Disp: 90 tablet, Rfl: 3 ?  ondansetron (ZOFRAN-ODT) 4 MG disintegrating tablet, Take 1 tablet (4 mg total) by mouth every 8 (eight) hours as needed for nausea or vomiting., Disp: 90 tablet, Rfl: 0 ?  OXYGEN, Inhale into the lungs., Disp: , Rfl:  ?  pantoprazole (PROTONIX) 40 MG tablet, Take 1  tablet (40 mg total) by mouth daily., Disp: 90 tablet, Rfl: 3 ?  SitaGLIPtin-MetFORMIN HCl (JANUMET XR) 50-500 MG TB24, TAKE ONE TABLET EVERY DAY WITH FOOD FOR DIABETES, Disp: 90 tablet, Rfl: 3 ?  tiotropium (SPIRIVA HANDIHALER) 18 MCG inhalation capsule, INHALE 1 CAPSULE VIA HANDIHALER ONCE DAILY AT THE SAME TIME EVERY DAY, Disp: 90 capsule, Rfl: 3 ? ?CT Chest Wo Contrast ? ?Result Date: 05/21/2021 ?CLINICAL DATA:  Lung carcinoma EXAM: CT CHEST WITHOUT CONTRAST TECHNIQUE: Multidetector CT imaging of the chest was performed following the standard protocol without IV contrast. RADIATION DOSE REDUCTION: This exam was performed according to the departmental dose-optimization program which includes automated exposure control, adjustment of the mA and/or kV according to patient size and/or use  of iterative reconstruction technique. COMPARISON:  Previous studies including the examination of 11/18/2020 FINDINGS: Cardiovascular: There are scattered coronary artery calcifications. There is ectasia of main pulmonary artery measuring 3.6 cm suggesting pulmonary arterial hypertension. Mediastinum/Nodes: No new significant lymphadenopathy seen. Thyroid appears smaller than usual in size. Lungs/Pleura: Centrilobular emphysema is seen. There is 3.1 x 1.9 cm pleural-based density in the lateral aspect of left upper lobe with surrounding patchy foci of increased interstitial markings. This may suggest residual neoplasm and postradiation changes. No significant interval changes are noted in this finding. In image 59 of series 3, there is 12 x 7 mm ill-defined nodular density in the right upper lobe which has not changed. In the image 81, there is 4.4 mm nodule in the right lower lobe with no significant change. There is small calcified granuloma in the right lower lobe. In the image 97, there is a 2 mm nodular density in the left lower lobe with no change. Subtle foci of increased interstitial markings in the anterior left mid lung  fields appear stable. Upper Abdomen: There is high attenuation in the dependent portion of the gallbladder suggesting presence of sludge and possibly stones. There is 13 mm nodule in the left adrenal which

## 2021-06-05 DIAGNOSIS — J449 Chronic obstructive pulmonary disease, unspecified: Secondary | ICD-10-CM | POA: Diagnosis not present

## 2021-06-11 ENCOUNTER — Other Ambulatory Visit: Payer: Self-pay | Admitting: Physician Assistant

## 2021-06-11 DIAGNOSIS — G894 Chronic pain syndrome: Secondary | ICD-10-CM

## 2021-06-12 NOTE — Telephone Encounter (Signed)
Med sent to pharmacy.

## 2021-06-16 ENCOUNTER — Other Ambulatory Visit: Payer: Self-pay | Admitting: Physician Assistant

## 2021-06-16 DIAGNOSIS — F411 Generalized anxiety disorder: Secondary | ICD-10-CM

## 2021-06-17 NOTE — Telephone Encounter (Signed)
Med sent to pharmacy.

## 2021-07-05 DIAGNOSIS — J449 Chronic obstructive pulmonary disease, unspecified: Secondary | ICD-10-CM | POA: Diagnosis not present

## 2021-07-06 ENCOUNTER — Other Ambulatory Visit: Payer: Self-pay | Admitting: Nurse Practitioner

## 2021-07-06 DIAGNOSIS — G894 Chronic pain syndrome: Secondary | ICD-10-CM

## 2021-07-07 NOTE — Telephone Encounter (Signed)
Med sent to pharmacy, cannot fill until 5/26

## 2021-07-17 ENCOUNTER — Other Ambulatory Visit: Payer: Self-pay

## 2021-07-17 DIAGNOSIS — J449 Chronic obstructive pulmonary disease, unspecified: Secondary | ICD-10-CM

## 2021-07-17 MED ORDER — ALBUTEROL SULFATE HFA 108 (90 BASE) MCG/ACT IN AERS: 2.0000 | INHALATION_SPRAY | Freq: Four times a day (QID) | RESPIRATORY_TRACT | 5 refills | Status: DC | PRN

## 2021-07-20 ENCOUNTER — Telehealth: Payer: Self-pay

## 2021-07-20 ENCOUNTER — Ambulatory Visit: Payer: Medicare Other | Admitting: Nurse Practitioner

## 2021-07-21 ENCOUNTER — Other Ambulatory Visit: Payer: Self-pay | Admitting: Nurse Practitioner

## 2021-07-21 DIAGNOSIS — F411 Generalized anxiety disorder: Secondary | ICD-10-CM

## 2021-07-21 NOTE — Telephone Encounter (Signed)
Medication sent to pharmacy  

## 2021-07-21 NOTE — Telephone Encounter (Signed)
Spoke with pt son that we send med

## 2021-07-22 ENCOUNTER — Ambulatory Visit (INDEPENDENT_AMBULATORY_CARE_PROVIDER_SITE_OTHER): Payer: Medicare Other | Admitting: Nurse Practitioner

## 2021-07-22 ENCOUNTER — Telehealth: Payer: Self-pay

## 2021-07-22 ENCOUNTER — Encounter: Payer: Self-pay | Admitting: Nurse Practitioner

## 2021-07-22 VITALS — BP 130/68 | HR 77 | Temp 98.8°F | Resp 16 | Ht 61.0 in | Wt 160.4 lb

## 2021-07-22 DIAGNOSIS — R112 Nausea with vomiting, unspecified: Secondary | ICD-10-CM

## 2021-07-22 DIAGNOSIS — E1165 Type 2 diabetes mellitus with hyperglycemia: Secondary | ICD-10-CM | POA: Diagnosis not present

## 2021-07-22 DIAGNOSIS — Z9981 Dependence on supplemental oxygen: Secondary | ICD-10-CM

## 2021-07-22 DIAGNOSIS — J449 Chronic obstructive pulmonary disease, unspecified: Secondary | ICD-10-CM

## 2021-07-22 DIAGNOSIS — K8689 Other specified diseases of pancreas: Secondary | ICD-10-CM

## 2021-07-22 LAB — POCT GLYCOSYLATED HEMOGLOBIN (HGB A1C): Hemoglobin A1C: 5.2 % (ref 4.0–5.6)

## 2021-07-22 MED ORDER — HYDROCODONE-ACETAMINOPHEN 7.5-325 MG PO TABS: 1.0000 | ORAL_TABLET | Freq: Two times a day (BID) | ORAL | 0 refills | Status: DC | PRN

## 2021-07-22 MED ORDER — LIDOCAINE VISCOUS HCL 2 % MT SOLN: 10.0000 mL | Freq: Two times a day (BID) | OROMUCOSAL | 5 refills | Status: DC | PRN

## 2021-07-22 MED ORDER — ALBUTEROL SULFATE HFA 108 (90 BASE) MCG/ACT IN AERS: 2.0000 | INHALATION_SPRAY | Freq: Four times a day (QID) | RESPIRATORY_TRACT | 12 refills | Status: DC | PRN

## 2021-07-22 MED ORDER — SPIRIVA HANDIHALER 18 MCG IN CAPS: ORAL_CAPSULE | RESPIRATORY_TRACT | 3 refills | Status: DC

## 2021-07-22 MED ORDER — ONDANSETRON 4 MG PO TBDP: 4.0000 mg | ORAL_TABLET | Freq: Three times a day (TID) | ORAL | 2 refills | Status: DC | PRN

## 2021-07-22 NOTE — Progress Notes (Signed)
[facility] [place], [place] [address]  Internal MEDICINE  Office Visit Note  Patient Name: Lynn Robbins  [id number]  [id number]  Date of Service: 07/22/2021  Chief Complaint  Patient presents with   Depression   Hypertension   COPD   Diabetes   Follow-up    Throwing up all the time, discuss meds, diarrhea, stomach feels queasy      HPI Lynn Robbins presents for a follow up visit for diabetes, hypertension, COPD and medication review. She reports that she often has nausea and vomiting and diarrhea. She is in need of multiple medication refills. She is also requesting to have her dose of pain medication increased. Her current dose is no longer alleviating the pain as well as it did.    Current Medication: Outpatient Encounter Medications as of 07/22/2021  Medication Sig   Accu-Chek FastClix Lancets MISC Use as directed to check sugars once daily. DX E11.65   clonazePAM (KLONOPIN) 1 MG tablet TAKE 1 TABLET BY MOUTH TWICE DAILY AS NEEDED FOR ANXIETY   glimepiride (AMARYL) 1 MG tablet Take one tab dialy with breakfast   glucose blood (ACCU-CHEK GUIDE) test strip Use as instructed to check blood sugars once daily DX: E11.65   losartan-hydrochlorothiazide (HYZAAR) 50-12.5 MG tablet Take 1 tablet by mouth daily.   OXYGEN Inhale into the lungs.   pantoprazole (PROTONIX) 40 MG tablet Take 1 tablet (40 mg total) by mouth daily.   SitaGLIPtin-MetFORMIN HCl (JANUMET XR) 50-500 MG TB24 TAKE ONE TABLET EVERY DAY WITH FOOD FOR DIABETES   [DISCONTINUED] albuterol (VENTOLIN HFA) 108 (90 Base) MCG/ACT inhaler Inhale 2 puffs into the lungs every 6 (six) hours as needed for wheezing or shortness of breath.   [DISCONTINUED] atenolol (TENORMIN) 25 MG tablet Take 1 tablet (25 mg total) by mouth daily.   [DISCONTINUED] GI Cocktail (alum & mag hydroxide, lidocaine, dicyclomine) oral mixture Take 10 mLs by mouth 2 (two) times daily as needed (abdominal pain).   [DISCONTINUED]  HYDROcodone-acetaminophen (NORCO) 7.5-325 MG tablet Take 1 tablet by mouth 2 (two) times daily as needed for moderate pain or severe pain.   [DISCONTINUED] HYDROcodone-acetaminophen (NORCO) 7.5-325 MG tablet Take 1 tablet by mouth 2 (two) times daily as needed for moderate pain or severe pain.   [DISCONTINUED] HYDROcodone-acetaminophen (NORCO/VICODIN) 5-325 MG tablet TAKE ONE (1) TABLET BY MOUTH TWO TIMES PER DAY AS NEEDED FOR MODERATE OR SEVEREPAIN   [DISCONTINUED] ondansetron (ZOFRAN-ODT) 4 MG disintegrating tablet Take 1 tablet (4 mg total) by mouth every 8 (eight) hours as needed for nausea or vomiting.   [DISCONTINUED] tiotropium (SPIRIVA HANDIHALER) 18 MCG inhalation capsule INHALE 1 CAPSULE VIA HANDIHALER ONCE DAILY AT THE SAME TIME EVERY DAY   GI Cocktail (alum & mag hydroxide, lidocaine, dicyclomine) oral mixture Take 10 mLs by mouth 2 (two) times daily as needed (abdominal pain).   tiotropium (SPIRIVA HANDIHALER) 18 MCG inhalation capsule INHALE 1 CAPSULE VIA HANDIHALER ONCE DAILY AT THE SAME TIME EVERY DAY   [DISCONTINUED] albuterol (VENTOLIN HFA) 108 (90 Base) MCG/ACT inhaler Inhale 2 puffs into the lungs every 6 (six) hours as needed for wheezing or shortness of breath.   [DISCONTINUED] ondansetron (ZOFRAN-ODT) 4 MG disintegrating tablet Take 1 tablet (4 mg total) by mouth every 8 (eight) hours as needed for nausea or vomiting.   No facility-administered encounter medications on file as of 07/22/2021.    Surgical History: Past Surgical History:  Procedure Laterality Date   [place]  History: Past Medical History:  Diagnosis Date   Cancer ([place])    CHF (congestive heart failure) (HCC)    COPD (chronic obstructive pulmonary disease) ([place])    Depression    Diabetes mellitus without complication (HCC)    HOH (hard of hearing)    Hypertension     Family History: Family History  Problem Relation Age of Onset   Colon cancer Mother    Aneurysm Mother 41        brain   Diabetes Father    Colon cancer Father    Lung cancer Sister    Breast cancer Sister    Lung cancer Sister    Cancer Sister        type unknown   Lung cancer Brother    Bone cancer Brother    Lung cancer Brother    Early death Daughter    Heart disease Daughter     Social History   Socioeconomic History   Marital status: Single    Spouse name: Not on file   Number of children: 5   Years of education: Not on file   Highest education level: Not on file  Occupational History   Not on file  Tobacco Use   Smoking status: Former    Packs/day: 3.00    Years: 57.00    Total pack years: 171.00    Types: Cigarettes    Quit date: 04/17/1998    Years since quitting: 23.4   Smokeless tobacco: Former    Types: Snuff  Vaping Use   Vaping Use: Never used  Substance and Sexual Activity   Alcohol use: No   Drug use: No   Sexual activity: Never  Other Topics Concern   Not on file  Social History Narrative   ** Merged History Encounter **       Social Determinants of Health   Financial Resource Strain: Low Risk  (10/03/2020)   Overall Financial Resource Strain (CARDIA)    Difficulty of Paying Living Expenses: Not very hard  Food Insecurity: Not on file  Transportation Needs: Not on file  Physical Activity: Not on file  Stress: Not on file  Social Connections: Not on file  Intimate Partner Violence: Not on file      Review of Systems  Constitutional:  Positive for fatigue. Negative for chills and unexpected weight change.  HENT:  Negative for congestion, rhinorrhea, sneezing and sore throat.   Eyes:  Negative for redness.  Respiratory:  Positive for shortness of breath (intermittent). Negative for cough, chest tightness and wheezing.   Cardiovascular: Negative.  Negative for chest pain and palpitations.  Gastrointestinal:  Positive for nausea and vomiting. Negative for abdominal distention, abdominal pain, constipation and diarrhea.  Genitourinary:  Negative for  dysuria and frequency.  Musculoskeletal:  Negative for arthralgias, back pain, joint swelling and neck pain.  Skin:  Negative for rash.  Allergic/Immunologic: Positive for immunocompromised state.  Neurological: Negative.  Negative for tremors and numbness.  Hematological:  Negative for adenopathy. Does not bruise/bleed easily.  Psychiatric/Behavioral:  Positive for sleep disturbance. Negative for behavioral problems (Depression), self-injury and suicidal ideas. The patient is not nervous/anxious.     Vital Signs: BP 130/68   Pulse 77   Temp 98.8 F (37.1 C)   Resp 16   Ht 5\' 1"  (1.549 m)   Wt 160 lb 6.4 oz (72.8 kg)   SpO2 95% Comment: with 2L oxygen  BMI 30.31 kg/m    Physical Exam Vitals reviewed.  Constitutional:  General: She is not in acute distress.    Appearance: Normal appearance. She is well-developed. She is obese. She is not ill-appearing or diaphoretic.  HENT:     Head: Normocephalic and atraumatic.  Neck:     Thyroid: No thyromegaly.     Vascular: No JVD.     Trachea: No tracheal deviation.  Cardiovascular:     Rate and Rhythm: Normal rate and regular rhythm.     Pulses: Normal pulses.     Heart sounds: Normal heart sounds. No murmur heard.    No friction rub. No gallop.  Pulmonary:     Effort: Pulmonary effort is normal. No respiratory distress.     Breath sounds: Normal breath sounds. No wheezing or rales.  Chest:     Chest wall: No tenderness.  Skin:    General: Skin is warm and dry.     Capillary Refill: Capillary refill takes less than 2 seconds.  Neurological:     Mental Status: She is alert and oriented to person, place, and time.  Psychiatric:        Mood and Affect: Mood normal.        Behavior: Behavior normal.        Assessment/Plan: 1. Type 2 diabetes mellitus with hyperglycemia, without long-term current use of insulin (HCC) A1c is normal, 5.2, will consider stopping glimeperide or janumet at next office visit.  - POCT HgB  A1C  2. Non-intractable vomiting with nausea Related to pancreatic mass, refills ordered to GI cocktail.  - GI Cocktail (alum & mag hydroxide, lidocaine, dicyclomine) oral mixture; Take 10 mLs by mouth 2 (two) times daily as needed (abdominal pain).  Dispense: 550 mL; Refill: 5  3. Chronic obstructive pulmonary disease, unspecified COPD type ([place]) DME order for extra battery and supplies, wants to switch to [facility], spiriva refills ordered - tiotropium (SPIRIVA HANDIHALER) 18 MCG inhalation capsule; INHALE 1 CAPSULE VIA HANDIHALER ONCE DAILY AT THE SAME TIME EVERY DAY  Dispense: 90 capsule; Refill: 3 - For home use only DME oxygen  4. O2 dependent Supplement O2 remains necessary, needs new battery, more supplies and is wanting to switch back to [facility] for DME management.  - For home use only DME oxygen  5. 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 I have discussed any further diagnostic evaluation that may be needed or ordered today. We also reviewed her medications today. she has been encouraged to call the office with any questions or concerns that should arise related to todays visit.    Orders Placed This Encounter  Procedures   For home use only DME oxygen   POCT HgB A1C    Meds ordered this encounter  Medications   DISCONTD: ondansetron (ZOFRAN-ODT) 4 MG disintegrating tablet    Sig: Take 1 tablet (4 mg total) by mouth every 8 (eight) hours as needed for nausea or vomiting.    Dispense:  90 tablet    Refill:  2   tiotropium (SPIRIVA HANDIHALER) 18 MCG inhalation capsule    Sig: INHALE 1 CAPSULE VIA HANDIHALER ONCE DAILY AT THE SAME TIME EVERY DAY    Dispense:  90 capsule    Refill:  3   DISCONTD: HYDROcodone-acetaminophen (NORCO) 7.5-325 MG tablet    Sig: Take 1 tablet by mouth 2 (two) times daily as needed for moderate pain or severe pain.     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 Note increased dose   DISCONTD: HYDROcodone-acetaminophen (NORCO) 7.5-325 MG tablet    Sig: Take 1 tablet by mouth 2 (two) times daily as needed for moderate pain or severe pain.    Dispense:  180 tablet    Refill:  0    3 month supply, increased dose   DISCONTD: albuterol (VENTOLIN HFA) 108 (90 Base) MCG/ACT inhaler    Sig: Inhale 2 puffs into the lungs every 6 (six) hours as needed for wheezing or shortness of breath.    Dispense:  24 g    Refill:  12    Please supply patient with 3 inhalers with each refill   DISCONTD: GI Cocktail (alum & mag hydroxide, lidocaine, dicyclomine) oral mixture    Sig: Take 10 mLs by mouth 2 (two) times daily as needed (abdominal pain).    Dispense:  550 mL    Refill:  5    Please fill today,   GI Cocktail (alum & mag hydroxide, lidocaine, dicyclomine) oral mixture    Sig: Take 10 mLs by mouth 2 (two) times daily as needed (abdominal pain).    Dispense:  550 mL    Refill:  5    Please fill pres without lidocaine    Return in about 6 months (around 01/21/2022) for F/U, med refill, Hydee Fleece PCP.   Total time spent:30 Minutes Time spent includes review of chart, medications, test results, and follow up plan with the patient.   [place] Controlled Substance Database was reviewed by me.  This patient was seen by Jonetta Osgood, FNP-C in collaboration with Dr. Clayborn Bigness as a part of collaborative care agreement.   Wilburt Messina R. Valetta Fuller, MSN, FNP-C Internal medicine

## 2021-07-22 NOTE — Telephone Encounter (Signed)
Gave kelly [facility] order for oxygen pt like to switch from [facility]

## 2021-07-22 NOTE — Telephone Encounter (Signed)
DME o2 ordered. Printed. Put in Desha's folder-Toni

## 2021-07-27 ENCOUNTER — Other Ambulatory Visit: Payer: Self-pay

## 2021-08-05 ENCOUNTER — Telehealth: Payer: Medicare Other

## 2021-08-05 DIAGNOSIS — J449 Chronic obstructive pulmonary disease, unspecified: Secondary | ICD-10-CM | POA: Diagnosis not present

## 2021-08-20 ENCOUNTER — Telehealth: Payer: Self-pay

## 2021-08-21 NOTE — Telephone Encounter (Signed)
Ok I will send them separately

## 2021-08-26 ENCOUNTER — Telehealth: Payer: Self-pay

## 2021-08-26 DIAGNOSIS — R112 Nausea with vomiting, unspecified: Secondary | ICD-10-CM

## 2021-08-26 MED ORDER — PREDNISONE 10 MG PO TABS: ORAL_TABLET | ORAL | 0 refills | Status: DC

## 2021-08-26 MED ORDER — ONDANSETRON 4 MG PO TBDP: 4.0000 mg | ORAL_TABLET | Freq: Three times a day (TID) | ORAL | 2 refills | Status: DC | PRN

## 2021-08-26 MED ORDER — LEVOFLOXACIN 500 MG PO TABS: ORAL_TABLET | ORAL | 0 refills | Status: DC

## 2021-08-26 NOTE — Telephone Encounter (Signed)
Pt called c/o having nausea and vomiting for a week and requesting something to help settle her stomach.  She has rx for Zofran 4 mg that I sent to pharmacy.  Pt also c/o having cough, chest congestion runny nose for a week or more.  Spoke to Dr Humphrey Rolls and she sent in Levaquin 500 mg x 10 days and prednisone x 18 tabs.  LMOM on both numbers for daughter and son letting them know I sent in medications to the pharmacy for patient and if any questions to call me back.

## 2021-08-27 MED ORDER — HYDROCODONE-ACETAMINOPHEN 7.5-325 MG PO TABS: 1.0000 | ORAL_TABLET | Freq: Two times a day (BID) | ORAL | 0 refills | Status: DC | PRN

## 2021-08-27 NOTE — Addendum Note (Signed)
Addended by: Jonetta Osgood on: 08/27/2021 10:41 PM   Modules accepted: Orders

## 2021-09-04 DIAGNOSIS — J449 Chronic obstructive pulmonary disease, unspecified: Secondary | ICD-10-CM | POA: Diagnosis not present

## 2021-09-15 ENCOUNTER — Other Ambulatory Visit: Payer: Self-pay

## 2021-09-15 ENCOUNTER — Telehealth: Payer: Self-pay

## 2021-09-15 DIAGNOSIS — J449 Chronic obstructive pulmonary disease, unspecified: Secondary | ICD-10-CM

## 2021-09-15 DIAGNOSIS — I10 Essential (primary) hypertension: Secondary | ICD-10-CM

## 2021-09-15 MED ORDER — ALBUTEROL SULFATE HFA 108 (90 BASE) MCG/ACT IN AERS: 2.0000 | INHALATION_SPRAY | Freq: Four times a day (QID) | RESPIRATORY_TRACT | 3 refills | Status: DC | PRN

## 2021-09-15 MED ORDER — ATENOLOL 25 MG PO TABS: 25.0000 mg | ORAL_TABLET | Freq: Every day | ORAL | 3 refills | Status: DC

## 2021-09-16 ENCOUNTER — Telehealth: Payer: Self-pay

## 2021-09-16 NOTE — Telephone Encounter (Signed)
[facility] and had them fill clonazepam and hydrocodone to be able to be ready with other meds filled to be picked up at one time.  Per Alyssa she approved pharmacy filling a few days earlier as they had filled on 08/20/21 before.

## 2021-09-16 NOTE — Telephone Encounter (Signed)
error 

## 2021-09-24 ENCOUNTER — Encounter: Payer: Self-pay | Admitting: Nurse Practitioner

## 2021-10-05 ENCOUNTER — Other Ambulatory Visit: Payer: Self-pay

## 2021-10-05 DIAGNOSIS — E1165 Type 2 diabetes mellitus with hyperglycemia: Secondary | ICD-10-CM

## 2021-10-05 DIAGNOSIS — J449 Chronic obstructive pulmonary disease, unspecified: Secondary | ICD-10-CM | POA: Diagnosis not present

## 2021-10-05 MED ORDER — JANUMET XR 50-500 MG PO TB24: ORAL_TABLET | ORAL | 3 refills | Status: DC

## 2021-10-13 ENCOUNTER — Telehealth: Payer: Self-pay

## 2021-10-13 NOTE — Telephone Encounter (Signed)
MR mailed to [facility]: [address] [place] [address] Toni 

## 2021-10-15 ENCOUNTER — Telehealth: Payer: Self-pay

## 2021-10-16 ENCOUNTER — Other Ambulatory Visit: Payer: Self-pay | Admitting: Nurse Practitioner

## 2021-10-16 DIAGNOSIS — F411 Generalized anxiety disorder: Secondary | ICD-10-CM

## 2021-10-16 DIAGNOSIS — J449 Chronic obstructive pulmonary disease, unspecified: Secondary | ICD-10-CM

## 2021-10-16 DIAGNOSIS — I10 Essential (primary) hypertension: Secondary | ICD-10-CM

## 2021-10-16 MED ORDER — ALBUTEROL SULFATE HFA 108 (90 BASE) MCG/ACT IN AERS: 2.0000 | INHALATION_SPRAY | Freq: Four times a day (QID) | RESPIRATORY_TRACT | 3 refills | Status: DC | PRN

## 2021-10-16 NOTE — Telephone Encounter (Signed)
meds sent to pharmacy

## 2021-10-16 NOTE — Telephone Encounter (Signed)
Please send ASAP

## 2021-10-16 NOTE — Telephone Encounter (Signed)
Error

## 2021-10-26 ENCOUNTER — Telehealth: Payer: Self-pay

## 2021-10-26 ENCOUNTER — Other Ambulatory Visit: Payer: Self-pay | Admitting: Nurse Practitioner

## 2021-10-26 DIAGNOSIS — E1165 Type 2 diabetes mellitus with hyperglycemia: Secondary | ICD-10-CM

## 2021-10-26 NOTE — Telephone Encounter (Signed)
Pt called requesting refill on her hydrocodone and glimepiride.  Seen refills available still and I informed pt and advised that I would call pharmacy for her and have them fill the request and for her or her son to check with pharmacy later

## 2021-11-05 DIAGNOSIS — J449 Chronic obstructive pulmonary disease, unspecified: Secondary | ICD-10-CM | POA: Diagnosis not present

## 2021-11-24 ENCOUNTER — Ambulatory Visit: Admission: RE | Admit: 2021-11-24 | Payer: Medicare Other | Source: Ambulatory Visit

## 2021-11-25 ENCOUNTER — Telehealth: Payer: Self-pay

## 2021-11-27 ENCOUNTER — Inpatient Hospital Stay: Payer: Medicare Other | Admitting: Oncology

## 2021-11-27 ENCOUNTER — Other Ambulatory Visit: Payer: Self-pay | Admitting: Nurse Practitioner

## 2021-11-27 MED ORDER — HYDROCODONE-ACETAMINOPHEN 7.5-325 MG PO TABS
1.0000 | ORAL_TABLET | Freq: Two times a day (BID) | ORAL | 0 refills | Status: DC | PRN
Start: 2021-11-27 — End: 2021-12-28

## 2021-12-01 ENCOUNTER — Telehealth: Payer: Self-pay

## 2021-12-01 ENCOUNTER — Other Ambulatory Visit: Payer: Self-pay

## 2021-12-01 MED ORDER — AZITHROMYCIN 250 MG PO TABS: 250.0000 mg | ORAL_TABLET | Freq: Every day | ORAL | 0 refills | Status: DC

## 2021-12-01 NOTE — Telephone Encounter (Signed)
Pt daughter called [phone] that pt is having congestion and sinusitis and also spoke with pt her daughter diane chambers are taking care of her and she gave permission to talk to her due to pt cannot hear good send [place] form for update also as per alyssa send Zithromax 250 1 tab po daily for 10 days

## 2021-12-05 DIAGNOSIS — J449 Chronic obstructive pulmonary disease, unspecified: Secondary | ICD-10-CM | POA: Diagnosis not present

## 2021-12-07 NOTE — Telephone Encounter (Signed)
Med send

## 2021-12-15 ENCOUNTER — Encounter: Payer: Self-pay | Admitting: Oncology

## 2021-12-28 ENCOUNTER — Telehealth: Payer: Self-pay

## 2021-12-28 ENCOUNTER — Other Ambulatory Visit: Payer: Self-pay

## 2021-12-28 DIAGNOSIS — J449 Chronic obstructive pulmonary disease, unspecified: Secondary | ICD-10-CM

## 2021-12-28 MED ORDER — HYDROCODONE-ACETAMINOPHEN 7.5-325 MG PO TABS: 1.0000 | ORAL_TABLET | Freq: Two times a day (BID) | ORAL | 0 refills | Status: DC | PRN

## 2021-12-28 MED ORDER — ALBUTEROL SULFATE HFA 108 (90 BASE) MCG/ACT IN AERS: 2.0000 | INHALATION_SPRAY | Freq: Four times a day (QID) | RESPIRATORY_TRACT | 3 refills | Status: DC | PRN

## 2021-12-28 NOTE — Telephone Encounter (Signed)
Left message for patient to give office a call back.

## 2022-01-04 ENCOUNTER — Ambulatory Visit: Payer: Medicare Other | Admitting: Nurse Practitioner

## 2022-01-05 DIAGNOSIS — J449 Chronic obstructive pulmonary disease, unspecified: Secondary | ICD-10-CM | POA: Diagnosis not present

## 2022-01-08 ENCOUNTER — Other Ambulatory Visit: Payer: Self-pay | Admitting: Nurse Practitioner

## 2022-01-08 DIAGNOSIS — F411 Generalized anxiety disorder: Secondary | ICD-10-CM

## 2022-01-18 ENCOUNTER — Ambulatory Visit: Payer: Medicare Other | Admitting: Nurse Practitioner

## 2022-01-21 ENCOUNTER — Ambulatory Visit: Payer: Medicare Other | Admitting: Nurse Practitioner

## 2022-01-27 ENCOUNTER — Telehealth: Payer: Self-pay

## 2022-01-27 ENCOUNTER — Ambulatory Visit (INDEPENDENT_AMBULATORY_CARE_PROVIDER_SITE_OTHER): Payer: Medicare Other | Admitting: Nurse Practitioner

## 2022-01-27 ENCOUNTER — Encounter: Payer: Self-pay | Admitting: Nurse Practitioner

## 2022-01-27 VITALS — BP 140/70 | HR 78 | Temp 97.5°F | Resp 16 | Ht 61.0 in | Wt 159.0 lb

## 2022-01-27 DIAGNOSIS — R112 Nausea with vomiting, unspecified: Secondary | ICD-10-CM

## 2022-01-27 DIAGNOSIS — I10 Essential (primary) hypertension: Secondary | ICD-10-CM

## 2022-01-27 DIAGNOSIS — Z760 Encounter for issue of repeat prescription: Secondary | ICD-10-CM | POA: Diagnosis not present

## 2022-01-27 DIAGNOSIS — E782 Mixed hyperlipidemia: Secondary | ICD-10-CM | POA: Diagnosis not present

## 2022-01-27 DIAGNOSIS — Z0001 Encounter for general adult medical examination with abnormal findings: Secondary | ICD-10-CM | POA: Diagnosis not present

## 2022-01-27 DIAGNOSIS — E1165 Type 2 diabetes mellitus with hyperglycemia: Secondary | ICD-10-CM

## 2022-01-27 DIAGNOSIS — J449 Chronic obstructive pulmonary disease, unspecified: Secondary | ICD-10-CM

## 2022-01-27 MED ORDER — HYDROCODONE-ACETAMINOPHEN 7.5-325 MG PO TABS: 1.0000 | ORAL_TABLET | Freq: Two times a day (BID) | ORAL | 0 refills | Status: DC | PRN

## 2022-01-27 MED ORDER — ONDANSETRON 4 MG PO TBDP: 4.0000 mg | ORAL_TABLET | Freq: Three times a day (TID) | ORAL | 2 refills | Status: DC | PRN

## 2022-01-27 NOTE — Telephone Encounter (Signed)
Send message and left message to Hol[place] from [facility]  for Oxygen order

## 2022-01-27 NOTE — Progress Notes (Signed)
[facility] [place], [place] [id number]  Internal MEDICINE  Office Visit Note  Patient Name: Lynn Robbins  [id number]  [id number]  Date of Service: 01/27/2022  Chief Complaint  Patient presents with   Medicare Wellness   Depression   Diabetes   Hypertension    HPI Lynn Robbins presents for an annual well visit and physical exam.   Lynn Robbins is an 81 year old female with lung cancer.  She is not receiving treatment and has been told by her oncologist that they are not able to do surgery to remove it due to her declining respiratory status.    She is accompanied to her office visit today by her son who takes helps care of her.  She has additional chronic medical problems including hypertension, COPD, hypothyroidism, hyperlipidemia and type 2 diabetes.  She is dependent on supplemental oxygen and her portable oxygen concentrator started malfunctioning and stopped working while at her office visit today. Her current portable oxygen concentrator is a few years old. She uses [employer] for her DME supplies.   --At room air, her oxygen level dropped to 88% at rest and supplemental oxygen was applied. She recovered to 99% on 2.5 LPM supplemental oxygen via nasal cannula.   She has not had any labs for a while including CBC, CMP and lipid panel New or worsening pain: stable and chronic, chronic opioid use, has cancer Other concerns: none today      01/27/2022    2:16 PM 01/19/2021   11:16 AM 01/17/2020   10:51 AM  MMSE - Mini Mental State Exam  Not completed:   Unable to complete  Orientation to time 5 5   Orientation to Place 5 5   Registration 3 3   Attention/ Calculation 5 5   Recall 3 3   Language- name 2 objects 2 2   Language- repeat 1 1   Language- follow 3 step command 0 3   Language- read & follow direction 1 1   Write a sentence 1 1   Copy design 1 1   Total score 27 30     Functional Status Survey: Is the patient deaf or have difficulty hearing?:  Yes Does the patient have difficulty seeing, even when wearing glasses/contacts?: No Does the patient have difficulty concentrating, remembering, or making decisions?: No Does the patient have difficulty walking or climbing stairs?: Yes Does the patient have difficulty dressing or bathing?: No Does the patient have difficulty doing errands alone such as visiting a doctor's office or shopping?: Yes     05/19/2020    2:17 PM 05/20/2020   11:10 AM 01/19/2021   11:11 AM 07/22/2021   10:05 AM 01/27/2022    2:13 PM  Fall Risk  Falls in the past year?  0 0 0 0  Was there an injury with Fall?     0  Fall Risk Category Calculator     0  Fall Risk Category     Low  Patient Fall Risk Level High fall risk  Low fall risk Low fall risk Low fall risk  Patient at Risk for Falls Due to   No Fall Risks No Fall Risks No Fall Risks  Fall risk Follow up   Falls evaluation completed Falls evaluation completed Falls evaluation completed       01/19/2021   11:11 AM  Depression screen PHQ 2/9  Decreased Interest 0  Down, Depressed, Hopeless 0  PHQ - 2 Score 0  Current Medication: Outpatient Encounter Medications as of 01/27/2022  Medication Sig   Accu-Chek FastClix Lancets MISC Use as directed to check sugars once daily. DX E11.65   albuterol (VENTOLIN HFA) 108 (90 Base) MCG/ACT inhaler Inhale 2 puffs into the lungs every 6 (six) hours as needed for wheezing or shortness of breath.   atenolol (TENORMIN) 25 MG tablet Take 1 tablet (25 mg total) by mouth daily.   azithromycin (ZITHROMAX) 250 MG tablet Take 1 tablet (250 mg total) by mouth daily.   clonazePAM (KLONOPIN) 1 MG tablet TAKE 1 TABLET BY MOUTH TWICE DAILY AS NEEDED FOR ANXIETY   GI Cocktail (alum & mag hydroxide, lidocaine, dicyclomine) oral mixture Take 10 mLs by mouth 2 (two) times daily as needed (abdominal pain).   glimepiride (AMARYL) 1 MG tablet TAKE 1 TABLET BY MOUTH DAILY WITH BREAKFAST   glucose blood (ACCU-CHEK GUIDE) test strip  Use as instructed to check blood sugars once daily DX: E11.65   levofloxacin (LEVAQUIN) 500 MG tablet Take 1 tablet by mouth once a day for 10 days   losartan-hydrochlorothiazide (HYZAAR) 50-12.5 MG tablet TAKE 1 TABLET BY MOUTH ONCE DAILY   OXYGEN Inhale into the lungs.   pantoprazole (PROTONIX) 40 MG tablet Take 1 tablet (40 mg total) by mouth daily.   predniSONE (DELTASONE) 10 MG tablet Take 1 tab po 3 x day for 3 days then take 1 tab po 2 x a day for 3 days and then take 1 tab po daily for 3 days   SitaGLIPtin-MetFORMIN HCl (JANUMET XR) 50-500 MG TB24 TAKE ONE TABLET EVERY DAY WITH FOOD FOR DIABETES   tiotropium (SPIRIVA HANDIHALER) 18 MCG inhalation capsule INHALE 1 CAPSULE VIA HANDIHALER ONCE DAILY AT THE SAME TIME EVERY DAY   [DISCONTINUED] HYDROcodone-acetaminophen (NORCO) 7.5-325 MG tablet Take 1 tablet by mouth 2 (two) times daily as needed for moderate pain or severe pain.   [DISCONTINUED] ondansetron (ZOFRAN-ODT) 4 MG disintegrating tablet Take 1 tablet (4 mg total) by mouth every 8 (eight) hours as needed for nausea or vomiting.   HYDROcodone-acetaminophen (NORCO) 7.5-325 MG tablet Take 1 tablet by mouth 2 (two) times daily as needed for moderate pain or severe pain.   ondansetron (ZOFRAN-ODT) 4 MG disintegrating tablet Take 1 tablet (4 mg total) by mouth every 8 (eight) hours as needed for nausea or vomiting.   No facility-administered encounter medications on file as of 01/27/2022.    Surgical History: Past Surgical History:  Procedure Laterality Date   CESAREAN SECTION      Medical History: Past Medical History:  Diagnosis Date   Cancer ([place])    CHF (congestive heart failure) (HCC)    COPD (chronic obstructive pulmonary disease) ([employer])    Depression    Diabetes mellitus without complication (HCC)    HOH (hard of hearing)    Hypertension     Family History: Family History  Problem Relation Age of Onset   Colon cancer Mother    Aneurysm Mother 32       brain    Diabetes Father    Colon cancer Father    Lung cancer Sister    Breast cancer Sister    Lung cancer Sister    Cancer Sister        type unknown   Lung cancer Brother    Bone cancer Brother    Lung cancer Brother    Early death Daughter    Heart disease Daughter     Social History   Socioeconomic History  Marital status: Single    Spouse name: Not on file   Number of children: 5   Years of education: Not on file   Highest education level: Not on file  Occupational History   Not on file  Tobacco Use   Smoking status: Former    Packs/day: 3.00    Years: 57.00    Total pack years: 171.00    Types: Cigarettes    Quit date: 04/17/1998    Years since quitting: 23.8   Smokeless tobacco: Former    Types: Snuff  Vaping Use   Vaping Use: Never used  Substance and Sexual Activity   Alcohol use: No   Drug use: No   Sexual activity: Never  Other Topics Concern   Not on file  Social History Narrative   ** Merged History Encounter **       Social Determinants of Health   Financial Resource Strain: Low Risk  (10/03/2020)   Overall Financial Resource Strain (CARDIA)    Difficulty of Paying Living Expenses: Not very hard  Food Insecurity: Not on file  Transportation Needs: Not on file  Physical Activity: Not on file  Stress: Not on file  Social Connections: Not on file  Intimate Partner Violence: Not on file      Review of Systems  Constitutional:  Negative for chills, diaphoresis and fatigue.  HENT:  Negative for ear pain, postnasal drip and sinus pressure.   Eyes:  Negative for photophobia, discharge, redness, itching and visual disturbance.  Respiratory:  Negative for cough, shortness of breath and wheezing.   Cardiovascular:  Negative for chest pain, palpitations and leg swelling.  Gastrointestinal:  Positive for diarrhea. Negative for abdominal pain, constipation, nausea and vomiting.  Genitourinary:  Negative for dysuria and flank pain.  Musculoskeletal:   Positive for back pain. Negative for arthralgias, gait problem and neck pain.  Skin:  Negative for color change.  Allergic/Immunologic: Negative for environmental allergies and food allergies.  Neurological:  Negative for dizziness and headaches.  Hematological:  Does not bruise/bleed easily.  Psychiatric/Behavioral:  Positive for depression. Negative for agitation, behavioral problems (depression) and hallucinations. The patient is nervous/anxious.     Vital Signs: BP (!) 140/70 Comment: 195/102  Pulse 78   Temp (!) 97.5 F (36.4 C)   Resp 16   Ht _0  (1.549 m)   Wt 159 lb (72.1 kg)   SpO2 (!) 88% Comment: on room air, on 2.5 LPM corrected to 99%.  BMI 30.04 kg/m    Physical Exam Vitals reviewed.  Constitutional:      General: She is not in acute distress.    Appearance: She is well-developed. She is not diaphoretic.  HENT:     Head: Normocephalic and atraumatic.     Mouth/Throat:     Pharynx: No oropharyngeal exudate.  Eyes:     Pupils: Pupils are equal, round, and reactive to light.  Neck:     Thyroid: No thyromegaly.     Vascular: No JVD.     Trachea: No tracheal deviation.  Cardiovascular:     Rate and Rhythm: Normal rate and regular rhythm.     Heart sounds: Normal heart sounds. No murmur heard.    No friction rub. No gallop.  Pulmonary:     Effort: Pulmonary effort is normal. No respiratory distress.     Breath sounds: No wheezing or rales.  Chest:     Chest wall: No tenderness.  Abdominal:     General: Bowel sounds are  normal.     Palpations: Abdomen is soft.  Musculoskeletal:        General: Normal range of motion.     Cervical back: Normal range of motion and neck supple.  Lymphadenopathy:     Cervical: No cervical adenopathy.  Skin:    General: Skin is warm and dry.  Neurological:     Mental Status: She is alert and oriented to person, place, and time.     Cranial Nerves: No cranial nerve deficit.  Psychiatric:        Behavior: Behavior normal.         Thought Content: Thought content normal.        Judgment: Judgment normal.        Assessment/Plan: 1. Encounter for general adult medical examination with abnormal findings Routine labs ordered. No preventive screenings due. Continue to provide her chronic pain medication and follow up every 6 months  - CBC with Differential/Platelet - CMP14+EGFR - Lipid Profile  2. Type 2 diabetes mellitus with hyperglycemia, without long-term current use of insulin (HCC) Routine labs ordered - CBC with Differential/Platelet - CMP14+EGFR - Lipid Profile  3. Chronic obstructive pulmonary disease, unspecified COPD type ([place]) Qualified to continue using her oxygen, needs new portable concentrator.  - For home use only DME oxygen  4. Mixed hyperlipidemia Routine labs ordered - CBC with Differential/Platelet - CMP14+EGFR - Lipid Profile  5. Medication refill - HYDROcodone-acetaminophen (NORCO) 7.5-325 MG tablet; Take 1 tablet by mouth 2 (two) times daily as needed for moderate pain or severe pain.  Dispense: 60 tablet; Refill: 0 - ondansetron (ZOFRAN-ODT) 4 MG disintegrating tablet; Take 1 tablet (4 mg total) by mouth every 8 (eight) hours as needed for nausea or vomiting.  Dispense: 90 tablet; Refill: 2      General Counseling: morayma [place] understanding of the findings of todays visit and agrees with plan of treatment. I have discussed any further diagnostic evaluation that may be needed or ordered today. We also reviewed her medications today. she has been encouraged to call the office with any questions or concerns that should arise related to todays visit.    Orders Placed This Encounter  Procedures   For home use only DME oxygen   CBC with Differential/Platelet   CMP14+EGFR   Lipid Profile    Meds ordered this encounter  Medications   HYDROcodone-acetaminophen (NORCO) 7.5-325 MG tablet    Sig: Take 1 tablet by mouth 2 (two) times daily as needed for moderate pain or  severe pain.    Dispense:  60 tablet    Refill:  0    chronic pain due to inoperable cancer.   ondansetron (ZOFRAN-ODT) 4 MG disintegrating tablet    Sig: Take 1 tablet (4 mg total) by mouth every 8 (eight) hours as needed for nausea or vomiting.    Dispense:  90 tablet    Refill:  2    Return in about 6 months (around 07/29/2022) for F/U, pain med refill, Brayton Baumgartner PCP.   Total time spent:30 Minutes Time spent includes review of chart, medications, test results, and follow up plan with the patient.   [place] Controlled Substance Database was reviewed by me.  This patient was seen by Jonetta Osgood, FNP-C in collaboration with Dr. Clayborn Bigness as a part of collaborative care agreement.  Roselynn Whitacre R. Valetta Fuller, MSN, FNP-C Internal medicine

## 2022-02-04 DIAGNOSIS — J449 Chronic obstructive pulmonary disease, unspecified: Secondary | ICD-10-CM | POA: Diagnosis not present

## 2022-02-05 ENCOUNTER — Other Ambulatory Visit: Payer: Self-pay | Admitting: Nurse Practitioner

## 2022-02-05 ENCOUNTER — Telehealth: Payer: Self-pay

## 2022-02-05 DIAGNOSIS — F411 Generalized anxiety disorder: Secondary | ICD-10-CM

## 2022-02-05 MED ORDER — CLONAZEPAM 1 MG PO TABS: 1.0000 mg | ORAL_TABLET | Freq: Two times a day (BID) | ORAL | 2 refills | Status: DC | PRN

## 2022-02-05 NOTE — Telephone Encounter (Signed)
done

## 2022-02-11 ENCOUNTER — Encounter: Payer: Self-pay | Admitting: Nurse Practitioner

## 2022-02-18 DIAGNOSIS — Z0001 Encounter for general adult medical examination with abnormal findings: Secondary | ICD-10-CM | POA: Diagnosis not present

## 2022-02-18 DIAGNOSIS — E782 Mixed hyperlipidemia: Secondary | ICD-10-CM | POA: Diagnosis not present

## 2022-02-18 DIAGNOSIS — I10 Essential (primary) hypertension: Secondary | ICD-10-CM | POA: Diagnosis not present

## 2022-02-18 DIAGNOSIS — J449 Chronic obstructive pulmonary disease, unspecified: Secondary | ICD-10-CM | POA: Diagnosis not present

## 2022-02-18 DIAGNOSIS — E1165 Type 2 diabetes mellitus with hyperglycemia: Secondary | ICD-10-CM | POA: Diagnosis not present

## 2022-02-19 LAB — CMP14+EGFR
ALT: 13 IU/L (ref 0–32)
AST: 24 IU/L (ref 0–40)
Albumin/Globulin Ratio: 1.3 (ref 1.2–2.2)
Albumin: 3.5 g/dL — ABNORMAL LOW (ref 3.7–4.7)
Alkaline Phosphatase: 101 IU/L (ref 44–121)
BUN/Creatinine Ratio: 23 (ref 12–28)
BUN: 15 mg/dL (ref 8–27)
Bilirubin Total: <0.2 mg/dL (ref 0.0–1.2)
CO2: 34 mmol/L — ABNORMAL HIGH (ref 20–29)
Calcium: 9.1 mg/dL (ref 8.7–10.3)
Chloride: 100 mmol/L (ref 96–106)
Creatinine, Ser: 0.65 mg/dL (ref 0.57–1.00)
Globulin, Total: 2.7 g/dL (ref 1.5–4.5)
Glucose: 215 mg/dL — ABNORMAL HIGH (ref 70–99)
Potassium: 5.7 mmol/L — ABNORMAL HIGH (ref 3.5–5.2)
Sodium: 143 mmol/L (ref 134–144)
Total Protein: 6.2 g/dL (ref 6.0–8.5)
eGFR: 88 mL/min/{1.73_m2} (ref >59)

## 2022-02-19 LAB — CBC WITH DIFFERENTIAL/PLATELET
Basophils Absolute: 0 10*3/uL (ref 0.0–0.2)
Basos: 1 %
EOS (ABSOLUTE): 0.1 10*3/uL (ref 0.0–0.4)
Eos: 3 %
Hematocrit: 32.7 % — ABNORMAL LOW (ref 34.0–46.6)
Hemoglobin: 10.7 g/dL — ABNORMAL LOW (ref 11.1–15.9)
Immature Grans (Abs): 0 10*3/uL (ref 0.0–0.1)
Immature Granulocytes: 0 %
Lymphocytes Absolute: 1 10*3/uL (ref 0.7–3.1)
Lymphs: 20 %
MCH: 31.3 pg (ref 26.6–33.0)
MCHC: 32.7 g/dL (ref 31.5–35.7)
MCV: 96 fL (ref 79–97)
Monocytes Absolute: 0.4 10*3/uL (ref 0.1–0.9)
Monocytes: 8 %
Neutrophils Absolute: 3.3 10*3/uL (ref 1.4–7.0)
Neutrophils: 68 %
Platelets: 299 10*3/uL (ref 150–450)
RBC: 3.42 x10E6/uL — ABNORMAL LOW (ref 3.77–5.28)
RDW: 14.6 % (ref 11.7–15.4)
WBC: 4.8 10*3/uL (ref 3.4–10.8)

## 2022-02-19 LAB — LIPID PANEL
Chol/HDL Ratio: 2.6 ratio (ref 0.0–4.4)
Cholesterol, Total: 166 mg/dL (ref 100–199)
HDL: 64 mg/dL (ref >39)
LDL Chol Calc (NIH): 85 mg/dL (ref 0–99)
Triglycerides: 90 mg/dL (ref 0–149)
VLDL Cholesterol Cal: 17 mg/dL (ref 5–40)

## 2022-02-24 NOTE — Progress Notes (Signed)
Please call patient and let her or her caregiver know: --Potassium level is elevated at 5.7, please ensure patient is not taking any OTC potassium supplements and avoid eating any foods that are high in potassium such as potatoes or bananas among others.  We should repeat potassium level in 1 month --cholesterol levels are normal --CBC should mild anemia, no interventions needed at this time.

## 2022-03-04 ENCOUNTER — Encounter: Payer: Self-pay | Admitting: Oncology

## 2022-03-04 NOTE — Telephone Encounter (Signed)
Her prognosis is based on her copd not her lung cancer. She needs to discuss this with her pcp or pulmonary if she has seen them

## 2022-03-05 ENCOUNTER — Encounter: Payer: Self-pay | Admitting: Nurse Practitioner

## 2022-03-05 NOTE — Addendum Note (Signed)
Addended by: Lyndon Code on: 03/05/2022 08:06 PM   Modules accepted: Level of Service

## 2022-03-07 DIAGNOSIS — J449 Chronic obstructive pulmonary disease, unspecified: Secondary | ICD-10-CM | POA: Diagnosis not present

## 2022-04-01 ENCOUNTER — Telehealth: Payer: Self-pay

## 2022-04-01 ENCOUNTER — Other Ambulatory Visit: Payer: Self-pay | Admitting: Nurse Practitioner

## 2022-04-01 DIAGNOSIS — Z760 Encounter for issue of repeat prescription: Secondary | ICD-10-CM

## 2022-04-05 NOTE — Telephone Encounter (Signed)
Done

## 2022-04-07 DIAGNOSIS — J449 Chronic obstructive pulmonary disease, unspecified: Secondary | ICD-10-CM | POA: Diagnosis not present

## 2022-04-09 ENCOUNTER — Inpatient Hospital Stay
Admission: EM | Admit: 2022-04-09 | Discharge: 2022-04-13 | DRG: 602 | Disposition: A | Discharge status: Home Health | Payer: 59 | Attending: Internal Medicine | Admitting: Internal Medicine

## 2022-04-09 ENCOUNTER — Emergency Department: Payer: 59

## 2022-04-09 ENCOUNTER — Other Ambulatory Visit: Payer: Self-pay

## 2022-04-09 ENCOUNTER — Encounter: Payer: Self-pay | Admitting: Internal Medicine

## 2022-04-09 DIAGNOSIS — Z803 Family history of malignant neoplasm of breast: Secondary | ICD-10-CM | POA: Diagnosis not present

## 2022-04-09 DIAGNOSIS — K861 Other chronic pancreatitis: Secondary | ICD-10-CM | POA: Diagnosis present

## 2022-04-09 DIAGNOSIS — Z87891 Personal history of nicotine dependence: Secondary | ICD-10-CM | POA: Diagnosis not present

## 2022-04-09 DIAGNOSIS — Z923 Personal history of irradiation: Secondary | ICD-10-CM | POA: Diagnosis not present

## 2022-04-09 DIAGNOSIS — Z801 Family history of malignant neoplasm of trachea, bronchus and lung: Secondary | ICD-10-CM | POA: Diagnosis not present

## 2022-04-09 DIAGNOSIS — R4189 Other symptoms and signs involving cognitive functions and awareness: Secondary | ICD-10-CM | POA: Diagnosis present

## 2022-04-09 DIAGNOSIS — L03211 Cellulitis of face: Secondary | ICD-10-CM | POA: Diagnosis not present

## 2022-04-09 DIAGNOSIS — E039 Hypothyroidism, unspecified: Secondary | ICD-10-CM | POA: Diagnosis not present

## 2022-04-09 DIAGNOSIS — I5032 Chronic diastolic (congestive) heart failure: Secondary | ICD-10-CM | POA: Diagnosis not present

## 2022-04-09 DIAGNOSIS — H9190 Unspecified hearing loss, unspecified ear: Secondary | ICD-10-CM | POA: Diagnosis present

## 2022-04-09 DIAGNOSIS — Z800 Family history of malignant neoplasm of digestive organs: Secondary | ICD-10-CM | POA: Diagnosis not present

## 2022-04-09 DIAGNOSIS — R54 Age-related physical debility: Secondary | ICD-10-CM | POA: Diagnosis present

## 2022-04-09 DIAGNOSIS — R918 Other nonspecific abnormal finding of lung field: Secondary | ICD-10-CM | POA: Diagnosis not present

## 2022-04-09 DIAGNOSIS — R609 Edema, unspecified: Secondary | ICD-10-CM | POA: Diagnosis not present

## 2022-04-09 DIAGNOSIS — I2782 Chronic pulmonary embolism: Secondary | ICD-10-CM | POA: Diagnosis not present

## 2022-04-09 DIAGNOSIS — N179 Acute kidney failure, unspecified: Secondary | ICD-10-CM | POA: Diagnosis present

## 2022-04-09 DIAGNOSIS — Z760 Encounter for issue of repeat prescription: Secondary | ICD-10-CM

## 2022-04-09 DIAGNOSIS — J449 Chronic obstructive pulmonary disease, unspecified: Secondary | ICD-10-CM | POA: Diagnosis present

## 2022-04-09 DIAGNOSIS — R6889 Other general symptoms and signs: Secondary | ICD-10-CM | POA: Diagnosis not present

## 2022-04-09 DIAGNOSIS — I110 Hypertensive heart disease with heart failure: Secondary | ICD-10-CM | POA: Diagnosis present

## 2022-04-09 DIAGNOSIS — J9621 Acute and chronic respiratory failure with hypoxia: Secondary | ICD-10-CM | POA: Diagnosis present

## 2022-04-09 DIAGNOSIS — Z9981 Dependence on supplemental oxygen: Secondary | ICD-10-CM

## 2022-04-09 DIAGNOSIS — Z833 Family history of diabetes mellitus: Secondary | ICD-10-CM | POA: Diagnosis not present

## 2022-04-09 DIAGNOSIS — E1165 Type 2 diabetes mellitus with hyperglycemia: Secondary | ICD-10-CM | POA: Diagnosis not present

## 2022-04-09 DIAGNOSIS — I10 Essential (primary) hypertension: Secondary | ICD-10-CM | POA: Diagnosis present

## 2022-04-09 DIAGNOSIS — Z85118 Personal history of other malignant neoplasm of bronchus and lung: Secondary | ICD-10-CM

## 2022-04-09 DIAGNOSIS — Z7189 Other specified counseling: Secondary | ICD-10-CM

## 2022-04-09 DIAGNOSIS — N281 Cyst of kidney, acquired: Secondary | ICD-10-CM | POA: Diagnosis not present

## 2022-04-09 DIAGNOSIS — R600 Localized edema: Secondary | ICD-10-CM | POA: Diagnosis not present

## 2022-04-09 DIAGNOSIS — K8689 Other specified diseases of pancreas: Secondary | ICD-10-CM | POA: Diagnosis not present

## 2022-04-09 DIAGNOSIS — F411 Generalized anxiety disorder: Secondary | ICD-10-CM | POA: Diagnosis present

## 2022-04-09 DIAGNOSIS — Z743 Need for continuous supervision: Secondary | ICD-10-CM | POA: Diagnosis not present

## 2022-04-09 DIAGNOSIS — E782 Mixed hyperlipidemia: Secondary | ICD-10-CM | POA: Diagnosis not present

## 2022-04-09 DIAGNOSIS — R5381 Other malaise: Secondary | ICD-10-CM | POA: Diagnosis not present

## 2022-04-09 DIAGNOSIS — J432 Centrilobular emphysema: Secondary | ICD-10-CM | POA: Diagnosis not present

## 2022-04-09 LAB — COMPREHENSIVE METABOLIC PANEL
ALT: 13 U/L (ref 0–44)
AST: 15 U/L (ref 15–41)
Albumin: 3.2 g/dL — ABNORMAL LOW (ref 3.5–5.0)
Alkaline Phosphatase: 96 U/L (ref 38–126)
Anion gap: 7 (ref 5–15)
BUN: 24 mg/dL — ABNORMAL HIGH (ref 8–23)
CO2: 36 mmol/L — ABNORMAL HIGH (ref 22–32)
Calcium: 9.4 mg/dL (ref 8.9–10.3)
Chloride: 102 mmol/L (ref 98–111)
Creatinine, Ser: 1.23 mg/dL — ABNORMAL HIGH (ref 0.44–1.00)
GFR, Estimated: 44 mL/min — ABNORMAL LOW (ref >60)
Glucose, Bld: 136 mg/dL — ABNORMAL HIGH (ref 70–99)
Potassium: 5.1 mmol/L (ref 3.5–5.1)
Sodium: 145 mmol/L (ref 135–145)
Total Bilirubin: 0.3 mg/dL (ref 0.3–1.2)
Total Protein: 6.9 g/dL (ref 6.5–8.1)

## 2022-04-09 LAB — CBC
HCT: 35.8 % — ABNORMAL LOW (ref 36.0–46.0)
Hemoglobin: 10 g/dL — ABNORMAL LOW (ref 12.0–15.0)
MCH: 30.3 pg (ref 26.0–34.0)
MCHC: 27.9 g/dL — ABNORMAL LOW (ref 30.0–36.0)
MCV: 108.5 fL — ABNORMAL HIGH (ref 80.0–100.0)
Platelets: 234 10*3/uL (ref 150–400)
RBC: 3.3 MIL/uL — ABNORMAL LOW (ref 3.87–5.11)
RDW: 14.4 % (ref 11.5–15.5)
WBC: 8.6 10*3/uL (ref 4.0–10.5)
nRBC: 0 % (ref 0.0–0.2)

## 2022-04-09 LAB — LACTIC ACID, PLASMA: Lactic Acid, Venous: 1.1 mmol/L (ref 0.5–1.9)

## 2022-04-09 MED ORDER — ENOXAPARIN SODIUM 40 MG/0.4ML IJ SOSY
40.0000 mg | PREFILLED_SYRINGE | INTRAMUSCULAR | Status: DC
  Administered 2022-04-09 – 2022-04-12 (×4): 40 mg via SUBCUTANEOUS
  Filled 2022-04-09 (×4): qty 0.4

## 2022-04-09 MED ORDER — IOHEXOL 300 MG/ML  SOLN
75.0000 mL | Freq: Once | INTRAMUSCULAR | Status: AC | PRN
  Administered 2022-04-09: 70 mL via INTRAVENOUS

## 2022-04-09 MED ORDER — ONDANSETRON HCL 4 MG/2ML IJ SOLN
4.0000 mg | Freq: Four times a day (QID) | INTRAMUSCULAR | Status: DC | PRN
  Administered 2022-04-10: 4 mg via INTRAVENOUS
  Filled 2022-04-09: qty 2

## 2022-04-09 MED ORDER — ONDANSETRON HCL 4 MG PO TABS: 4.0000 mg | ORAL_TABLET | Freq: Four times a day (QID) | ORAL | Status: DC | PRN

## 2022-04-09 MED ORDER — IPRATROPIUM-ALBUTEROL 0.5-2.5 (3) MG/3ML IN SOLN: 3.0000 mL | RESPIRATORY_TRACT | Status: DC | PRN

## 2022-04-09 MED ORDER — LACTATED RINGERS IV SOLN: INTRAVENOUS | Status: DC

## 2022-04-09 MED ORDER — MORPHINE SULFATE (PF) 2 MG/ML IV SOLN
2.0000 mg | INTRAVENOUS | Status: DC | PRN
  Administered 2022-04-10: 2 mg via INTRAVENOUS
  Filled 2022-04-09: qty 1

## 2022-04-09 MED ORDER — SODIUM CHLORIDE 0.9 % IV SOLN
2.0000 g | Freq: Two times a day (BID) | INTRAVENOUS | Status: DC
  Administered 2022-04-09: 2 g via INTRAVENOUS
  Filled 2022-04-09: qty 12.5
  Filled 2022-04-09: qty 2

## 2022-04-09 MED ORDER — SODIUM CHLORIDE 0.9 % IV SOLN
2.0000 g | INTRAVENOUS | Status: DC
  Administered 2022-04-09: 2 g via INTRAVENOUS
  Filled 2022-04-09: qty 20

## 2022-04-09 MED ORDER — ALBUTEROL SULFATE (2.5 MG/3ML) 0.083% IN NEBU
2.5000 mg | INHALATION_SOLUTION | RESPIRATORY_TRACT | Status: DC | PRN
  Administered 2022-04-09 – 2022-04-10 (×3): 2.5 mg via RESPIRATORY_TRACT
  Filled 2022-04-09 (×4): qty 3

## 2022-04-09 MED ORDER — VANCOMYCIN HCL IN DEXTROSE 1-5 GM/200ML-% IV SOLN: 1000.0000 mg | Freq: Once | INTRAVENOUS | Status: DC

## 2022-04-09 MED ORDER — VANCOMYCIN HCL 1500 MG/300ML IV SOLN
1500.0000 mg | Freq: Once | INTRAVENOUS | Status: AC
  Administered 2022-04-09: 1500 mg via INTRAVENOUS
  Filled 2022-04-09: qty 300

## 2022-04-09 NOTE — Plan of Care (Signed)
  Problem: Education: Goal: Knowledge of General Education information will improve Description: Including pain rating scale, medication(s)/side effects and non-pharmacologic comfort measures Outcome: Progressing   Problem: Pain Managment: Goal: General experience of comfort will improve Outcome: Progressing   Problem: Safety: Goal: Ability to remain free from injury will improve Outcome: Progressing   

## 2022-04-09 NOTE — H&P (Signed)
History and Physical    Patient: Lynn Robbins [id number] DOB: 1940-07-01 DOA: 04/09/2022 DOS: the patient was seen and examined on 04/09/2022 PCP: Jonetta Osgood, NP  Patient coming from: Home  Chief Complaint:  Chief Complaint  Patient presents with   Facial Swelling   HPI: Lynn Robbins is a 82 y.o. female with medical history significant of diastolic CHF, type 2 diabetes, hard of hearing, essential hypertension, depression, generalized anxiety disorder, COPD on home O2, hypothyroidism, lung cancer, who presented to the ER with pain and swelling of the left face as well as redness which started about 3 to 4 days.  This has extended to below the left jaw.  Denied any dental problems.  Denied any bite or injury.  Patient was noted to have low-grade temperature 100.2.  Also facial swelling consistent with cellulitis of the face.  She is being admitted to the hospital for further relation and treatment.  Patient is not receiving any treatment for her pancreatic cancer apparently.  Review of Systems: As mentioned in the history of present illness. All other systems reviewed and are negative. Past Medical History:  Diagnosis Date   Cancer ([place])    CHF (congestive heart failure) (HCC)    COPD (chronic obstructive pulmonary disease) ([place])    Depression    Diabetes mellitus without complication (HCC)    HOH (hard of hearing)    Hypertension    Past Surgical History:  Procedure Laterality Date   CESAREAN SECTION     Social History:  reports that she quit smoking about 23 years ago. Her smoking use included cigarettes. She has a 171.00 pack-year smoking history. She has quit using smokeless tobacco.  Her smokeless tobacco use included snuff. She reports that she does not drink alcohol and does not use drugs.  No Known Allergies  Family History  Problem Relation Age of Onset   Colon cancer Mother    Aneurysm Mother 72       brain   Diabetes Father    Colon cancer Father     Lung cancer Sister    Breast cancer Sister    Lung cancer Sister    Cancer Sister        type unknown   Lung cancer Brother    Bone cancer Brother    Lung cancer Brother    Early death Daughter    Heart disease Daughter     Prior to Admission medications   Medication Sig Start Date End Date Taking? Authorizing Provider  albuterol (VENTOLIN HFA) 108 (90 Base) MCG/ACT inhaler Inhale 2 puffs into the lungs every 6 (six) hours as needed for wheezing or shortness of breath. 12/28/21  Yes Abernathy, Yetta Flock, NP  atenolol (TENORMIN) 25 MG tablet Take 1 tablet (25 mg total) by mouth daily. 09/15/21  Yes Abernathy, Yetta Flock, NP  clonazePAM (KLONOPIN) 1 MG tablet Take 1 tablet (1 mg total) by mouth 2 (two) times daily as needed. for anxiety 02/05/22  Yes Abernathy, Yetta Flock, NP  GI Cocktail (alum & mag hydroxide, lidocaine, dicyclomine) oral mixture Take 10 mLs by mouth 2 (two) times daily as needed (abdominal pain). 07/22/21  Yes Abernathy, Alyssa, NP  glimepiride (AMARYL) 1 MG tablet TAKE 1 TABLET BY MOUTH DAILY WITH BREAKFAST 10/26/21  Yes Abernathy, Alyssa, NP  HYDROcodone-acetaminophen (NORCO) 7.5-325 MG tablet TAKE ONE TABLET BY MOUTH TWICE DAILY AS NEEDED FOR MODERATE OR SEVERE PAIN 04/01/22  Yes [name], MD  losartan-hydrochlorothiazide (HYZAAR) 50-12.5 MG tablet TAKE 1 TABLET BY  MOUTH ONCE DAILY 10/16/21  Yes Abernathy, Alyssa, NP  ondansetron (ZOFRAN-ODT) 4 MG disintegrating tablet Take 1 tablet (4 mg total) by mouth every 8 (eight) hours as needed for nausea or vomiting. 01/27/22  Yes Abernathy, Alyssa, NP  pantoprazole (PROTONIX) 40 MG tablet Take 1 tablet (40 mg total) by mouth daily. 01/19/21  Yes Abernathy, Yetta Flock, NP  SitaGLIPtin-MetFORMIN HCl (JANUMET XR) 50-500 MG TB24 TAKE ONE TABLET EVERY DAY WITH FOOD FOR DIABETES 10/05/21  Yes Abernathy, Yetta Flock, NP  Accu-Chek FastClix Lancets MISC Use as directed to check sugars once daily. DX E11.65 01/19/21   Jonetta Osgood, NP  azithromycin  (ZITHROMAX) 250 MG tablet Take 1 tablet (250 mg total) by mouth daily. Patient not taking: Reported on 04/09/2022 12/01/21   Jonetta Osgood, NP  glucose blood (ACCU-CHEK GUIDE) test strip Use as instructed to check blood sugars once daily DX: E11.65 01/19/21   Jonetta Osgood, NP  levofloxacin (LEVAQUIN) 500 MG tablet Take 1 tablet by mouth once a day for 10 days Patient not taking: Reported on 04/09/2022 08/26/21   [name], MD  OXYGEN Inhale into the lungs.    [provider]  predniSONE (DELTASONE) 10 MG tablet Take 1 tab po 3 x day for 3 days then take 1 tab po 2 x a day for 3 days and then take 1 tab po daily for 3 days Patient not taking: Reported on 04/09/2022 08/26/21   [name], MD  tiotropium (SPIRIVA HANDIHALER) 18 MCG inhalation capsule INHALE 1 CAPSULE VIA HANDIHALER ONCE DAILY AT [place] Patient not taking: Reported on 04/09/2022 07/22/21   Jonetta Osgood, NP    Physical Exam: Vitals:   04/09/22 1347 04/09/22 1730 04/09/22 1800 04/09/22 1900  BP:  (!) 158/61 (!) 149/88 (!) 168/80  Pulse:  70 66 73  Resp:  (!) 25 (!) 29 18  Temp:    98.8 F (37.1 C)  TempSrc:    Oral  SpO2: 100% 100% 100% 98%  Weight:      Height:       Constitutional: Acutely ill looking, NAD, calm, comfortable Eyes: PERRL, lids and conjunctivae normal, significant facial cellulitis noted, mainly on the left.  Warm red to touch around the jaw. ENMT: Mucous membranes are moist. Posterior pharynx clear of any exudate or lesions.Normal dentition.  Neck: normal, supple, no masses, no thyromegaly Respiratory: Decreased air entry requiring oxygen no accessory muscle use.  Cardiovascular: Regular rate and rhythm, no murmurs / rubs / gallops. No extremity edema. 2+ pedal pulses. No carotid bruits.  Abdomen: no tenderness, no masses palpated. No hepatosplenomegaly. Bowel sounds positive.  Musculoskeletal: Good range of motion, no joint swelling or tenderness, Skin: no rashes,  lesions, ulcers. No induration Neurologic: CN 2-12 grossly intact. Sensation intact, DTR normal. Strength 5/5 in all 4.  Psychiatric: Normal judgment and insight. Alert and oriented x 3. Normal mood  Data Reviewed:  Glucose 136, Creatinine of 1.23, Hemoglobin 10, CT maxillofacial W contrast shows edema and enlarged lymph nodes in the left submandibular space nonspecific but suggestive of cellulitis and lymphadenitis.  Also inflamed and thickened left buccal mucosa along the mandible probably infectious given the above findings.  Assessment and Plan:  #1 left facial cellulitis: No evidence of abscess.  Patient will be admitted.  Initiate IV antibiotics.  Will stop vancomycin and cefepime.  Obtain culture results.  Monitor closely.  If abscess develops surgery will be involved.  #2 COPD and chronic respiratory failure: Continue with oxygenation  and breathing treatment.  #3 essential hypertension: Continue blood pressure control.  #4 type 2 diabetes: Sliding scale insulin  #5 hypothyroidism: Continue levothyroxine  #6 mixed hyperlipidemia: Statin.  #7 lung cancer: Patient declines treatment.  Not on chemo.  Following up with Dr. Janese Banks  #8 generalized anxiety disorder, continue home regimen.     Advance Care Planning:   Code Status: Full Code   Consults: None  Family Communication: Daughters  Severity of Illness: The appropriate patient status for this patient is INPATIENT. Inpatient status is judged to be reasonable and necessary in order to provide the required intensity of service to ensure the patient's safety. The patient's presenting symptoms, physical exam findings, and initial radiographic and laboratory data in the context of their chronic comorbidities is felt to place them at high risk for further clinical deterioration. Furthermore, it is not anticipated that the patient will be medically stable for discharge from the hospital within 2 midnights of admission.   * I certify  that at the point of admission it is my clinical judgment that the patient will require inpatient hospital care spanning beyond 2 midnights from the point of admission due to high intensity of service, high risk for further deterioration and high frequency of surveillance required.*  AuthorBarbette Merino, MD 04/09/2022 9:42 PM  For on call review [email or web address].

## 2022-04-09 NOTE — ED Notes (Signed)
Per Dr Archie Balboa, a second lactic acid is not needed for the patient since the first one was WNL

## 2022-04-09 NOTE — Consult Note (Signed)
Pharmacy Antibiotic Note  Lynn Robbins is a 82 y.o. female admitted on 04/09/2022 with cellulitis of jaw. PMH significant for recently diagnosed pancreatic cancer, CHF, COPD, T2DM, HTN. Patient presented with pain and swelling of the left face with redness now extending below the left jaw. Pharmacy has been consulted for vancomycin and cefepime dosing.  Plan: Day 1 of antibiotics Discontinue ceftriaxone  Give Vancomycin 1500 mg IV x1. Check random vancomycin level tomorrow given AKI (Scr 1.23, baseline ~0.7) Start cefepime 2  g IV Q12H Continue to monitor renal function and follow culture results   Height: '5\' 1"'$  (154.9 cm) Weight: 74.4 kg (164 lb 0.4 oz) IBW/kg (Calculated) : 47.8  Temp (24hrs), Avg:99.5 F (37.5 C), Min:98.8 F (37.1 C), Max:100.2 F (37.9 C)  Recent Labs  Lab 04/09/22 1349  WBC 8.6  CREATININE 1.23*  LATICACIDVEN 1.1    Estimated Creatinine Clearance: 33.1 mL/min (A) (by C-G formula based on SCr of 1.23 mg/dL (H)).    No Known Allergies  Antimicrobials this admission: 2/23 Ceftriaxone x1 2/23 Cefepime >>  2/23 Vancomycin >>   Dose adjustments this admission: N/A  Microbiology results: 2/23 BCx: IP  Thank you for allowing pharmacy to be a part of this patient's care.  Gretel Acre, PharmD PGY1 Pharmacy Resident 04/09/2022 7:30 PM

## 2022-04-09 NOTE — ED Provider Notes (Signed)
[facility] Provider Note    Event Date/Time   First MD Initiated Contact with Patient 04/09/22 1336     (approximate)  History   Chief Complaint: Facial Swelling  HPI  Lynn Robbins is a 82 y.o. female with a past medical history of recently diagnosed pancreatic cancer, CHF, COPD, diabetes, hypertension, presents emergency department for left facial swelling and discomfort.  According to the patient for the past 3 to 4 days she has had pain and swelling of the left face with redness now extending below the left jaw.  Patient denies any gum/gingival pain, is edentulous.  Low-grade fever 100.2 in the emergency department.  Physical Exam   Triage Vital Signs: ED Triage Vitals [04/09/22 1344]  Enc Vitals Group     BP (!) 182/90     Pulse Rate 73     Resp (!) 26     Temp 100.2 F (37.9 C)     Temp src      SpO2 100 %     Weight 164 lb 0.4 oz (74.4 kg)     Height '5\' 1"'$  (1.549 m)     Head Circumference      Peak Flow      Pain Score      Pain Loc      Pain Edu?      Excl. in [place]?     Most recent vital signs: Vitals:   04/09/22 1344 04/09/22 1347  BP: (!) 182/90   Pulse: 73   Resp: (!) 26   Temp: 100.2 F (37.9 C)   SpO2: 100% 100%    General: Awake, no distress.  Hard of hearing CV:  Good peripheral perfusion.  Regular rate and rhythm  Resp:  Normal effort.  Equal breath sounds bilaterally.  Abd:  No distention.  Soft, nontender.  No rebound or guarding. Other:  Patient has moderate erythema and tenderness at the left mandibular area extending into the submandibular space of the neck.  Exam most concerning for abscess for cellulitis.  Patient is edentulous.   ED Results / Procedures / Treatments   EKG  EKG viewed and interpreted by myself shows a sinus rhythm at 69 bpm with a narrow QRS, normal axis, normal intervals, nonspecific but no concerning ST changes.  RADIOLOGY  CT scan pending   MEDICATIONS ORDERED IN ED: Medications -  No data to display   IMPRESSION / MDM / [place] / ED COURSE  I reviewed the triage vital signs and the nursing notes.  Patient's presentation is most consistent with acute presentation with potential threat to life or bodily function.  Patient presents emergency department for left facial swelling erythema.  There is an indurated area possibly consistent with abscess as well as induration and erythema extending submandibularly into the neck.  Will check labs we will obtain CT imaging to further evaluate.  Patient does have tenderness to this area temperature borderline elevated 100.2 we will cover with IV antibiotics we will check labs, cultures, lactic acid and continue to closely monitor.  Patient agreeable to plan of care.  Patient's lab work is overall reassuring with a normal CBC with a normal white blood cell count, chemistry is reassuring.  Lactic acid of 1.1.  Patient is receiving IV antibiotics, blood cultures have been sent.  Patient care signed out to oncoming provider CT scan pending.  FINAL CLINICAL IMPRESSION(S) / ED DIAGNOSES   Left facial cellulitis   Note:  This document was  prepared using Systems analyst and may include unintentional dictation errors.   Harvest Dark, MD 04/09/22 1451

## 2022-04-09 NOTE — ED Notes (Signed)
Informed RN bed assigned 

## 2022-04-09 NOTE — ED Triage Notes (Signed)
Patient presents to ER via EMS from home with left sided facial swelling/pain for 4 days.

## 2022-04-10 DIAGNOSIS — I10 Essential (primary) hypertension: Secondary | ICD-10-CM | POA: Diagnosis not present

## 2022-04-10 DIAGNOSIS — L03211 Cellulitis of face: Secondary | ICD-10-CM | POA: Diagnosis not present

## 2022-04-10 DIAGNOSIS — E1165 Type 2 diabetes mellitus with hyperglycemia: Secondary | ICD-10-CM

## 2022-04-10 DIAGNOSIS — J449 Chronic obstructive pulmonary disease, unspecified: Secondary | ICD-10-CM | POA: Diagnosis not present

## 2022-04-10 LAB — COMPREHENSIVE METABOLIC PANEL
ALT: 11 U/L (ref 0–44)
AST: 13 U/L — ABNORMAL LOW (ref 15–41)
Albumin: 2.7 g/dL — ABNORMAL LOW (ref 3.5–5.0)
Alkaline Phosphatase: 75 U/L (ref 38–126)
Anion gap: 7 (ref 5–15)
BUN: 20 mg/dL (ref 8–23)
CO2: 34 mmol/L — ABNORMAL HIGH (ref 22–32)
Calcium: 8.8 mg/dL — ABNORMAL LOW (ref 8.9–10.3)
Chloride: 100 mmol/L (ref 98–111)
Creatinine, Ser: 0.67 mg/dL (ref 0.44–1.00)
GFR, Estimated: >60 mL/min (ref >60)
Glucose, Bld: 111 mg/dL — ABNORMAL HIGH (ref 70–99)
Potassium: 4.8 mmol/L (ref 3.5–5.1)
Sodium: 141 mmol/L (ref 135–145)
Total Bilirubin: 0.4 mg/dL (ref 0.3–1.2)
Total Protein: 5.6 g/dL — ABNORMAL LOW (ref 6.5–8.1)

## 2022-04-10 LAB — CBC
HCT: 30.3 % — ABNORMAL LOW (ref 36.0–46.0)
Hemoglobin: 8.7 g/dL — ABNORMAL LOW (ref 12.0–15.0)
MCH: 30.4 pg (ref 26.0–34.0)
MCHC: 28.7 g/dL — ABNORMAL LOW (ref 30.0–36.0)
MCV: 105.9 fL — ABNORMAL HIGH (ref 80.0–100.0)
Platelets: 192 10*3/uL (ref 150–400)
RBC: 2.86 MIL/uL — ABNORMAL LOW (ref 3.87–5.11)
RDW: 14.4 % (ref 11.5–15.5)
WBC: 7.5 10*3/uL (ref 4.0–10.5)
nRBC: 0 % (ref 0.0–0.2)

## 2022-04-10 LAB — TSH: TSH: 2.674 u[IU]/mL (ref 0.350–4.500)

## 2022-04-10 MED ORDER — CLONAZEPAM 1 MG PO TABS
1.0000 mg | ORAL_TABLET | Freq: Two times a day (BID) | ORAL | Status: DC | PRN
  Administered 2022-04-10 – 2022-04-12 (×3): 1 mg via ORAL
  Filled 2022-04-10 (×3): qty 1

## 2022-04-10 MED ORDER — SODIUM CHLORIDE 0.9 % IV SOLN
3.0000 g | Freq: Four times a day (QID) | INTRAVENOUS | Status: DC
  Administered 2022-04-10 – 2022-04-13 (×11): 3 g via INTRAVENOUS
  Filled 2022-04-10: qty 8
  Filled 2022-04-10: qty 3
  Filled 2022-04-10: qty 8
  Filled 2022-04-10: qty 3
  Filled 2022-04-10 (×7): qty 8
  Filled 2022-04-10 (×2): qty 3
  Filled 2022-04-10: qty 8

## 2022-04-10 MED ORDER — ATENOLOL 25 MG PO TABS
25.0000 mg | ORAL_TABLET | Freq: Every day | ORAL | Status: DC
  Administered 2022-04-10 – 2022-04-13 (×4): 25 mg via ORAL
  Filled 2022-04-10 (×4): qty 1

## 2022-04-10 MED ORDER — HYDROCHLOROTHIAZIDE 12.5 MG PO TABS
12.5000 mg | ORAL_TABLET | Freq: Every day | ORAL | Status: DC
  Administered 2022-04-10 – 2022-04-13 (×4): 12.5 mg via ORAL
  Filled 2022-04-10 (×4): qty 1

## 2022-04-10 MED ORDER — TIOTROPIUM BROMIDE MONOHYDRATE 18 MCG IN CAPS
18.0000 ug | ORAL_CAPSULE | Freq: Every day | RESPIRATORY_TRACT | Status: DC
  Administered 2022-04-10 – 2022-04-13 (×4): 18 ug via RESPIRATORY_TRACT
  Filled 2022-04-10: qty 5

## 2022-04-10 MED ORDER — VANCOMYCIN HCL IN DEXTROSE 1-5 GM/200ML-% IV SOLN: 1000.0000 mg | INTRAVENOUS | Status: DC

## 2022-04-10 MED ORDER — HYDROCODONE-ACETAMINOPHEN 7.5-325 MG PO TABS
1.0000 | ORAL_TABLET | Freq: Four times a day (QID) | ORAL | Status: DC | PRN
  Administered 2022-04-11 – 2022-04-13 (×4): 1 via ORAL
  Filled 2022-04-10 (×4): qty 1

## 2022-04-10 MED ORDER — PANTOPRAZOLE SODIUM 40 MG PO TBEC
40.0000 mg | DELAYED_RELEASE_TABLET | Freq: Every day | ORAL | Status: DC
  Administered 2022-04-10 – 2022-04-13 (×4): 40 mg via ORAL
  Filled 2022-04-10 (×4): qty 1

## 2022-04-10 MED ORDER — ALBUTEROL SULFATE HFA 108 (90 BASE) MCG/ACT IN AERS: 2.0000 | INHALATION_SPRAY | Freq: Four times a day (QID) | RESPIRATORY_TRACT | Status: DC | PRN

## 2022-04-10 MED ORDER — LOSARTAN POTASSIUM 50 MG PO TABS
50.0000 mg | ORAL_TABLET | Freq: Every day | ORAL | Status: DC
  Administered 2022-04-10 – 2022-04-13 (×4): 50 mg via ORAL
  Filled 2022-04-10 (×4): qty 1

## 2022-04-10 MED ORDER — SODIUM CHLORIDE 0.9 % IV SOLN: 12.5000 mg | Freq: Four times a day (QID) | INTRAVENOUS | Status: DC | PRN

## 2022-04-10 MED ORDER — LOSARTAN POTASSIUM-HCTZ 50-12.5 MG PO TABS: 1.0000 | ORAL_TABLET | Freq: Every day | ORAL | Status: DC

## 2022-04-10 NOTE — Assessment & Plan Note (Signed)
S/p surgery and radiation. Per daughter there was a small unchanged cystic lesion in pancreas now which is being monitored with no definitive diagnosis. -Continue follow-up with outpatient oncology

## 2022-04-10 NOTE — Assessment & Plan Note (Signed)
Not on any thyroid supplement at home. -Check TSH

## 2022-04-10 NOTE — Assessment & Plan Note (Signed)
Due to above. -Continue with home oxygen

## 2022-04-10 NOTE — Assessment & Plan Note (Signed)
SSI

## 2022-04-10 NOTE — TOC Progression Note (Signed)
Transition of Care [facility]) - Progression Note    Patient Details  Name: Lynn Robbins MRN: [id number] Date of Birth: [birth year]  [facility] of [facility] CM/SW Contact  Eileen Stanford, LCSW Phone Number: 04/10/2022, 3:29 PM  Clinical Narrative:   CSW spoke with pt's daughter whom is stating pt needs LTC medicaid bed at facility. CSW will send a bed search out. CSW will provide sister with bed availability.         Expected Discharge Plan and Services                                               Social Determinants of Health (SDOH) Interventions SDOH Screenings   Food Insecurity: No Food Insecurity (04/09/2022)  Housing: Low Risk  (04/09/2022)  Transportation Needs: No Transportation Needs (04/09/2022)  Utilities: Not At Risk (04/09/2022)  Alcohol Screen: Low Risk  (07/22/2021)  Depression (PHQ2-9): Low Risk  (01/19/2021)  Financial Resource Strain: Low Risk  (10/03/2020)  Tobacco Use: Medium Risk (04/09/2022)    Readmission Risk Interventions     No data to display

## 2022-04-10 NOTE — Hospital Course (Signed)
Taken from H&P.   Lynn Robbins is a 82 y.o. female with medical history significant of diastolic CHF, type 2 diabetes, hard of hearing, essential hypertension, depression, generalized anxiety disorder, COPD on home O2, hypothyroidism, lung cancer, who presented to the ER with pain and swelling of the left face as well as redness which started about 3 to 4 days.  This has extended to below the left jaw.  Denied any dental problems.  Denied any bite or injury.  Patient was noted to have low-grade temperature 100.2.  Also facial swelling consistent with cellulitis of the face.   2/24: Patient maxillofacial CT with edema and enlarged lymph node in the left submandibular space, nonspecific but suggestive of cellulitis and lymphadenitis.  No drainable fluid collection noted.  Inflamed and thickened left buccal mucosa along the mandible, probably infectious given the above finding.  Correlate with direct inspection to exclude malignancy. Patient received vancomycin and cefepime in ED and continued on cefepime only.  2/24: Vital stable, mildly elevated blood pressure at 144/46.  AKI has been resolved.  Hemoglobin decreased to 8.7 without any obvious bleeding, all cell lines decreased, most likely some dilutional effect.  Switching cefepime with Unasyn.  2/25: Vital stable.  Patient is medically stable for discharge but now family wants LTC placement??  Had a long discussion with daughter, son-in-law and son at bedside.  Daughter even contacted her oncologist, Dr. Janese Banks in after hours, they were extremely worried and pretty much sure that they were told that patient has multiple metastasis and is going to die, not sure how they get that perception.  Radiology noted mention ruling out underlying metastatic lesion based on her history of lung cancer.  They were concern of metastatic pancreatic cancer with no tissue diagnosis, patient has chronic pancreatitis with multiple cysts which seems stable up till prior CT done  a year ago.  Had discussion with Dr. Janese Banks and she advised doing CT chest, abdomen and pelvis with contrast to rule out or confirm any metastatic lesions.  Daughter even consulted [facility] herself.  Patient and family does not even want DNR, she is not hospice eligible at this time.  CT chest and abdomen was ordered.  Also ordered PT evaluation as family was extremely concerned that they cannot take care of her at home. At the time of exam patient was very anxious and crying that she is not getting her home medications and she cannot breathe, told nursing staff to give her as needed Klonopin to decrease the anxiety.  There was no wheezing and she was saturating well on her baseline oxygen requirement.  It appears that patient do have some cognitive impairment with some other underlying psych issues and anxiety.  Patient is at baseline for mobility so PT is recommending home health.  If family wants a long-term care they need to look for it as outpatient.

## 2022-04-10 NOTE — NC FL2 (Signed)
[place] LEVEL OF CARE FORM     IDENTIFICATION  Patient Name: Lynn Robbins Birthdate: [birth year] Sex: female Admission Date (Current Location): 04/09/2022  [facility] and [place] Number:  Engineering geologist and Address:  [facility], [address], [place], [place] [address]      Provider Number: [id number]  Attending Physician Name and Address:  Lorella Nimrod, MD  Relative Name and Phone Number:       Current Level of Care: Hospital Recommended Level of Care: [place] Prior Approval Number:    Date Approved/Denied:   PASRR Number:    Discharge Plan: SNF    Current Diagnoses: Patient Active Problem List   Diagnosis Date Noted   Cellulitis of jaw 04/09/2022   Chronic obstructive pulmonary disease ([place]) 09/03/2019   Supplemental oxygen dependent 09/03/2019   Bilateral hearing loss 03/09/2019   Generalized anxiety disorder 03/09/2019   Goals of care, counseling/discussion 04/18/2018   Mass of upper lobe of left lung 04/18/2018   Intermittent asthma without complication [id number]   Chronic pain disorder 06/07/2017   Essential hypertension 06/07/2017   Mixed hyperlipidemia 02/03/2017   Hypothyroidism 02/03/2017   Type 2 diabetes mellitus with hyperglycemia ([place]) 02/03/2017   Dysuria 02/03/2017    Orientation RESPIRATION BLADDER Height & Weight     Self, Time, Situation, Place  O2 (nasal cannula 2.5L) Continent Weight: 164 lb 0.4 oz (74.4 kg) Height:  '5\' 1"'$  (154.9 cm)  BEHAVIORAL SYMPTOMS/MOOD NEUROLOGICAL BOWEL NUTRITION STATUS      Continent Diet (heart healthy)  AMBULATORY STATUS COMMUNICATION OF NEEDS Skin   Limited Assist Verbally Normal                       Personal Care Assistance Level of Assistance  Bathing, Feeding, Dressing Bathing Assistance: Limited assistance Feeding assistance: Independent Dressing Assistance: Limited assistance     Functional Limitations Info  Hearing,  Speech, Sight Sight Info: Adequate Hearing Info: Adequate Speech Info: Adequate    SPECIAL CARE FACTORS FREQUENCY                       Contractures Contractures Info: Not present    Additional Factors Info  Code Status, Allergies Code Status Info: full code Allergies Info: no known allergies           Current Medications (04/10/2022):  This is the current hospital active medication list Current Facility-Administered Medications  Medication Dose Route Frequency Provider Last Rate Last Admin   albuterol (PROVENTIL) (2.5 MG/3ML) 0.083% nebulizer solution 2.5 mg  2.5 mg Nebulization Q4H PRN Sharion Settler, NP   2.5 mg at 04/10/22 0906   Ampicillin-Sulbactam (UNASYN) 3 g in sodium chloride 0.9 % 100 mL IVPB  3 g Intravenous Q6H Amin, Soundra Pilon, MD 200 mL/hr at 04/10/22 1342 3 g at 04/10/22 1342   atenolol (TENORMIN) tablet 25 mg  25 mg Oral Daily Lorella Nimrod, MD   25 mg at 04/10/22 1023   clonazePAM (KLONOPIN) tablet 1 mg  1 mg Oral BID PRN Lorella Nimrod, MD       enoxaparin (LOVENOX) injection 40 mg  40 mg Subcutaneous Q24H Garba, Mohammad L, MD   40 mg at 04/09/22 2130   losartan (COZAAR) tablet 50 mg  50 mg Oral Daily Alison Murray, RPH       And   hydrochlorothiazide (HYDRODIURIL) tablet 12.5 mg  12.5 mg Oral Daily Alison Murray, [facility]  HYDROcodone-acetaminophen (NORCO) 7.5-325 MG per tablet 1 tablet  1 tablet Oral Q6H PRN Lorella Nimrod, MD       morphine (PF) 2 MG/ML injection 2 mg  2 mg Intravenous Q2H PRN Gala Romney L, MD   2 mg at 04/10/22 1247   ondansetron (ZOFRAN) tablet 4 mg  4 mg Oral Q6H PRN Elwyn Reach, MD       Or   ondansetron (ZOFRAN) injection 4 mg  4 mg Intravenous Q6H PRN Gala Romney L, MD   4 mg at 04/10/22 1259   pantoprazole (PROTONIX) EC tablet 40 mg  40 mg Oral Daily Lorella Nimrod, MD   40 mg at 04/10/22 1023   tiotropium (SPIRIVA) inhalation capsule ([facility] use ONLY) 18 mcg  18 mcg Inhalation Daily Lorella Nimrod, MD   18 mcg at  04/10/22 1022     Discharge Medications: Please see discharge summary for a list of discharge medications.  Relevant Imaging Results:  Relevant Lab Results:   Additional Information ssn: [id number]  Eileen Stanford, LCSW

## 2022-04-10 NOTE — Assessment & Plan Note (Signed)
No evidence of any abscess.  Some submandibular lymph node involvement. Patient received cefepime and vancomycin initially.  Preliminary blood cultures negative. -Switch antibiotics with Unasyn -Patient need to follow-up with her oncologist to rule out any underlying mandibular bone lesion as an outpatient.

## 2022-04-10 NOTE — Assessment & Plan Note (Signed)
Discussed with patient and confirmed with daughter that patient would like to remain full code with full scope of care.

## 2022-04-10 NOTE — Assessment & Plan Note (Signed)
No concern of acute exacerbation.  Remained on baseline oxygen requirement of 2 to 3 L. -Continue with home bronchodilator

## 2022-04-10 NOTE — Progress Notes (Signed)
Progress Note   Patient: Lynn Robbins [id number] DOB: 17-Jul-1940 DOA: 04/09/2022     1 DOS: the patient was seen and examined on 04/10/2022   Brief hospital course: Taken from H&P.   MYRL JUSTMAN is a 82 y.o. female with medical history significant of diastolic CHF, type 2 diabetes, hard of hearing, essential hypertension, depression, generalized anxiety disorder, COPD on home O2, hypothyroidism, lung cancer, who presented to the ER with pain and swelling of the left face as well as redness which started about 3 to 4 days.  This has extended to below the left jaw.  Denied any dental problems.  Denied any bite or injury.  Patient was noted to have low-grade temperature 100.2.  Also facial swelling consistent with cellulitis of the face.   2/24: Patient maxillofacial CT with edema and enlarged lymph node in the left submandibular space, nonspecific but suggestive of cellulitis and lymphadenitis.  No drainable fluid collection noted.  Inflamed and thickened left buccal mucosa along the mandible, probably infectious given the above finding.  Correlate with direct inspection to exclude malignancy. Patient received vancomycin and cefepime in ED and continued on cefepime only.  2/24: Vital stable, mildly elevated blood pressure at 144/46.  AKI has been resolved.  Hemoglobin decreased to 8.7 without any obvious bleeding, all cell lines decreased, most likely some dilutional effect.  Switching cefepime with Unasyn.    Assessment and Plan: * Cellulitis of jaw No evidence of any abscess.  Some submandibular lymph node involvement. Patient received cefepime and vancomycin initially.  Preliminary blood cultures negative. -Switch antibiotics with Unasyn -Patient need to follow-up with her oncologist to rule out any underlying mandibular bone lesion as an outpatient.  Supplemental oxygen dependent Due to above. -Continue with home oxygen  Chronic obstructive pulmonary disease (HCC) No concern  of acute exacerbation.  Remained on baseline oxygen requirement of 2 to 3 L. -Continue with home bronchodilator  Type 2 diabetes mellitus with hyperglycemia (HCC) -SSI  Essential hypertension Mildly elevated blood pressure. -Restart home atenolol and Hyzaar  Hypothyroidism Not on any thyroid supplement at home. -Check TSH  Mixed hyperlipidemia Not on any statin at home  Goals of care, counseling/discussion Discussed with patient and confirmed with daughter that patient would like to remain full code with full scope of care.  Mass of upper lobe of left lung S/p surgery and radiation. Per daughter there was a small unchanged cystic lesion in pancreas now which is being monitored with no definitive diagnosis. -Continue follow-up with outpatient oncology   Subjective: Patient was seen and examined today.  Mild discomfort below chin.  No dental pain.  Physical Exam: Vitals:   04/09/22 1900 04/09/22 2317 04/10/22 0739 04/10/22 1000  BP: (!) 168/80 (!) 137/48 (!) 144/46 (!) 141/54  Pulse: 73 78 68 (!) 110  Resp: '18 18 16   '$ Temp: 98.8 F (37.1 C) 99 F (37.2 C) 98.7 F (37.1 C)   TempSrc: Oral     SpO2: 98% 98% 94%   Weight:      Height:       General.  Frail elderly lady, in no acute distress.  Mild erythema and tenderness below chin and on left lower jaw. Pulmonary.  Lungs clear bilaterally, normal respiratory effort. CV.  Regular rate and rhythm, no JVD, rub or murmur. Abdomen.  Soft, nontender, nondistended, BS positive. CNS.  Alert and oriented .  No focal neurologic deficit. Extremities.  No edema, no cyanosis, pulses intact and symmetrical. Psychiatry.  Judgment  and insight appears normal.   Data Reviewed: Prior data reviewed  Family Communication: Discussed with daughter on phone  Disposition: Status is: Inpatient Remains inpatient appropriate because: Severity of illness  Planned Discharge Destination: Home  DVT prophylaxis.  Lovenox Time spent: 45  minutes  This record has been created using Systems analyst. Errors have been sought and corrected,but may not always be located. Such creation errors do not reflect on the standard of care.   Author: Lorella Nimrod, MD 04/10/2022 1:49 PM  For on call review [email or web address].

## 2022-04-10 NOTE — Assessment & Plan Note (Signed)
Mildly elevated blood pressure. -Restart home atenolol and Hyzaar

## 2022-04-10 NOTE — Consult Note (Addendum)
Pharmacy Antibiotic Note  Lynn Robbins is a 82 y.o. female admitted on 04/09/2022 with cellulitis of jaw. PMH significant for recently diagnosed pancreatic cancer, CHF, COPD, T2DM, HTN. Patient presented with pain and swelling of the left face with redness now extending below the left jaw. Pharmacy has been consulted for vancomycin and cefepime dosing.  Plan: Day 2 of antibiotics Vancomycin 1000 mg IV Q 24 hrs. Goal AUC 400-550. Expected AUC: 479.4 Expected Css: 12.6 SCr used: 0.8(actual 0.67), Vd used: 0.72 Continue Cefepime 2  g IV Q12H Continue to monitor renal function and follow culture results   Height: '5\' 1"'$  (154.9 cm) Weight: 74.4 kg (164 lb 0.4 oz) IBW/kg (Calculated) : 47.8  Temp (24hrs), Avg:99.2 F (37.3 C), Min:98.7 F (37.1 C), Max:100.2 F (37.9 C)  Recent Labs  Lab 04/09/22 1349 04/10/22 0458  WBC 8.6 7.5  CREATININE 1.23* 0.67  LATICACIDVEN 1.1  --      Estimated Creatinine Clearance: 50.8 mL/min (by C-G formula based on SCr of 0.67 mg/dL).    No Known Allergies  Antimicrobials this admission: 2/23 Ceftriaxone x1 2/23 Cefepime >>  2/23 Vancomycin >>   Dose adjustments this admission: N/A  Microbiology results: 2/23 BCx: IP  Thank you for allowing pharmacy to be a part of this patient's care.  Pearla Dubonnet, PharmD Clinical Pharmacist 04/10/2022 10:12 AM

## 2022-04-10 NOTE — Assessment & Plan Note (Signed)
Not on any statin at home

## 2022-04-11 ENCOUNTER — Inpatient Hospital Stay: Payer: 59

## 2022-04-11 DIAGNOSIS — E1165 Type 2 diabetes mellitus with hyperglycemia: Secondary | ICD-10-CM | POA: Diagnosis not present

## 2022-04-11 DIAGNOSIS — I10 Essential (primary) hypertension: Secondary | ICD-10-CM | POA: Diagnosis not present

## 2022-04-11 DIAGNOSIS — L03211 Cellulitis of face: Secondary | ICD-10-CM | POA: Diagnosis not present

## 2022-04-11 DIAGNOSIS — J449 Chronic obstructive pulmonary disease, unspecified: Secondary | ICD-10-CM | POA: Diagnosis not present

## 2022-04-11 LAB — GLUCOSE, CAPILLARY
Glucose-Capillary: 177 mg/dL — ABNORMAL HIGH (ref 70–99)
Glucose-Capillary: 71 mg/dL (ref 70–99)
Glucose-Capillary: 151 mg/dL — ABNORMAL HIGH (ref 70–99)

## 2022-04-11 MED ORDER — INSULIN ASPART 100 UNIT/ML IJ SOLN: 0.0000 [IU] | Freq: Every day | INTRAMUSCULAR | Status: DC

## 2022-04-11 MED ORDER — LACTATED RINGERS IV SOLN: INTRAVENOUS | Status: AC

## 2022-04-11 MED ORDER — INSULIN ASPART 100 UNIT/ML IJ SOLN
0.0000 [IU] | Freq: Three times a day (TID) | INTRAMUSCULAR | Status: DC
  Administered 2022-04-11 – 2022-04-12 (×2): 2 [IU] via SUBCUTANEOUS
  Administered 2022-04-12 – 2022-04-13 (×2): 1 [IU] via SUBCUTANEOUS
  Filled 2022-04-11 (×4): qty 1

## 2022-04-11 MED ORDER — IOHEXOL 300 MG/ML  SOLN
100.0000 mL | Freq: Once | INTRAMUSCULAR | Status: AC | PRN
  Administered 2022-04-11: 100 mL via INTRAVENOUS

## 2022-04-11 MED ORDER — IOHEXOL 9 MG/ML PO SOLN
500.0000 mL | Freq: Once | ORAL | Status: DC | PRN
  Administered 2022-04-11: 500 mL via ORAL

## 2022-04-11 NOTE — Assessment & Plan Note (Signed)
SSI

## 2022-04-11 NOTE — Assessment & Plan Note (Addendum)
Discussed with patient and confirmed with daughter that patient would like to remain full code with full scope of care. Family is becoming very anxious with concern of metastatic disease which was mentioned by radiologist on CT. they were asking about hospice but does not want to change the CODE STATUS. -Had a very long and lengthy discussion with multiple family members. -Patient is at her baseline for mobility so PT is recommending home health

## 2022-04-11 NOTE — Progress Notes (Signed)
Patient's blood glucose is borderline low. Gave her a sandwich tray and juice. Will recheck.

## 2022-04-11 NOTE — Assessment & Plan Note (Signed)
Not on any thyroid supplement at home. -TSH normal.

## 2022-04-11 NOTE — Assessment & Plan Note (Signed)
No evidence of any abscess.  Some submandibular lymph node involvement. Patient received cefepime and vancomycin initially.  Preliminary blood cultures negative.  Clinically seems improving. -Continue with Unasyn today-can be switched to Augmentin on discharge. -Patient need to follow-up with her oncologist to rule out any underlying mandibular bone lesion as an outpatient. -Also ordered CT chest, abdomen and pelvis as advised by Dr. Janese Banks to decrease the family anxiety.

## 2022-04-11 NOTE — Progress Notes (Signed)
Progress Note   Patient: Lynn Robbins [id number] DOB: 1940/06/06 DOA: 04/09/2022     2 DOS: the patient was seen and examined on 04/11/2022   Brief hospital course: Taken from H&P.   Lynn Robbins is a 82 y.o. female with medical history significant of diastolic CHF, type 2 diabetes, hard of hearing, essential hypertension, depression, generalized anxiety disorder, COPD on home O2, hypothyroidism, lung cancer, who presented to the ER with pain and swelling of the left face as well as redness which started about 3 to 4 days.  This has extended to below the left jaw.  Denied any dental problems.  Denied any bite or injury.  Patient was noted to have low-grade temperature 100.2.  Also facial swelling consistent with cellulitis of the face.   2/24: Patient maxillofacial CT with edema and enlarged lymph node in the left submandibular space, nonspecific but suggestive of cellulitis and lymphadenitis.  No drainable fluid collection noted.  Inflamed and thickened left buccal mucosa along the mandible, probably infectious given the above finding.  Correlate with direct inspection to exclude malignancy. Patient received vancomycin and cefepime in ED and continued on cefepime only.  2/24: Vital stable, mildly elevated blood pressure at 144/46.  AKI has been resolved.  Hemoglobin decreased to 8.7 without any obvious bleeding, all cell lines decreased, most likely some dilutional effect.  Switching cefepime with Unasyn.  2/25: Vital stable.  Patient is medically stable for discharge but now family wants LTC placement??  Had a long discussion with daughter, son-in-law and son at bedside.  Daughter even contacted her oncologist, Dr. Janese Banks in after hours, they were extremely worried and pretty much sure that they were told that patient has multiple metastasis and is going to die, not sure how they get that perception.  Radiology noted mention ruling out underlying metastatic lesion based on her history of lung  cancer.  They were concern of metastatic pancreatic cancer with no tissue diagnosis, patient has chronic pancreatitis with multiple cysts which seems stable up till prior CT done a year ago.  Had discussion with Dr. Janese Banks and she advised doing CT chest, abdomen and pelvis with contrast to rule out or confirm any metastatic lesions.  Daughter even consulted [facility] herself.  Patient and family does not even want DNR, she is not hospice eligible at this time.  CT chest and abdomen was ordered.  Also ordered PT evaluation as family was extremely concerned that they cannot take care of her at home. At the time of exam patient was very anxious and crying that she is not getting her home medications and she cannot breathe, told nursing staff to give her as needed Klonopin to decrease the anxiety.  There was no wheezing and she was saturating well on her baseline oxygen requirement.  It appears that patient do have some cognitive impairment with some other underlying psych issues and anxiety.  Patient is at baseline for mobility so PT is recommending home health.  If family wants a long-term care they need to look for it as outpatient.   Assessment and Plan: * Cellulitis of jaw No evidence of any abscess.  Some submandibular lymph node involvement. Patient received cefepime and vancomycin initially.  Preliminary blood cultures negative.  Clinically seems improving. -Continue with Unasyn today-can be switched to Augmentin on discharge. -Patient need to follow-up with her oncologist to rule out any underlying mandibular bone lesion as an outpatient. -Also ordered CT chest, abdomen and pelvis as advised by Dr. Janese Banks  to decrease the family anxiety.  Supplemental oxygen dependent Due to above. -Continue with home oxygen  Chronic obstructive pulmonary disease (HCC) No concern of acute exacerbation.  Remained on baseline oxygen requirement of 2 to 3 L. -Continue with home bronchodilator  Type 2 diabetes  mellitus with hyperglycemia (HCC) -SSI  Essential hypertension Mildly elevated blood pressure. -Restart home atenolol and Hyzaar  Hypothyroidism Not on any thyroid supplement at home. -TSH normal.  Mixed hyperlipidemia Not on any statin at home  Goals of care, counseling/discussion Discussed with patient and confirmed with daughter that patient would like to remain full code with full scope of care. Family is becoming very anxious with concern of metastatic disease which was mentioned by radiologist on CT. they were asking about hospice but does not want to change the CODE STATUS. -Had a very long and lengthy discussion with multiple family members. 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 Per daughter there was a small unchanged cystic lesion in pancreas now which is being monitored with no definitive diagnosis. -Continue follow-up with outpatient oncology   Subjective: Patient was seen and examined today.  Appears to be very anxious, needed a lot of reassurance.  Multiple family members at bedside who were very concerned about metastatic disease.  Physical Exam: Vitals:   04/10/22 0739 04/10/22 1000 04/11/22 0009 04/11/22 0821  BP: (!) 144/46 (!) 141/54 109/67 131/78  Pulse: 68 (!) 110 65 (!) 103  Resp: '16  16 16  '$ Temp: 98.7 F (37.1 C)  99.3 F (37.4 C) 98.4 F (36.9 C)  TempSrc:      SpO2: 94%  100% 99%  Weight:      Height:       General.  Frail elderly lady, in no acute distress.  Improving edema and erythema of jaw.  Few palpable underlying lymph node. Pulmonary.  Lungs clear bilaterally, normal respiratory effort. CV.  Regular rate and rhythm, no JVD, rub or murmur. Abdomen.  Soft, nontender, nondistended, BS positive. CNS.  Alert and oriented .  No focal neurologic deficit. Extremities.  No edema, no cyanosis, pulses intact and symmetrical. Psychiatry.  Appears to  have some cognitive impairment.  Data Reviewed: Prior data reviewed  Family Communication: Discussed with daughter, son and son-in-law at bedside.  Disposition: Status is: Inpatient Remains inpatient appropriate because: Severity of illness  Planned Discharge Destination: Home  DVT prophylaxis.  Lovenox Time spent: 45 minutes  This record has been created using Systems analyst. Errors have been sought and corrected,but may not always be located. Such creation errors do not reflect on the standard of care.   Author: Lorella Nimrod, MD 04/11/2022 1:37 PM  For on call review [email or web address].

## 2022-04-11 NOTE — Progress Notes (Signed)
       CROSS COVER NOTE  NAME: JOSELINNE LAWAL MRN: [id number] DOB : Jul 06, 1940 ATTENDING PHYSICIAN: Lorella Nimrod, MD    Date of Service   04/11/2022   HPI/Events of Note   NO tachycardia, no change in O2 requirement, no change in dyspnea***  Interventions   Assessment/Plan:  Pulmonary Embolism - (R) branch suspects is chronic but not seen on previous imaging Discuss risks/benefits of Eliquis with patient/Oncology tomorrow X  Englarging (L) Upper Lobe Mass X    *** professional thanks      To reach the provider On-Call:   7AM- 7PM see care teams to locate the attending and reach out to them via [email or web address] Password: [email or web address] 7PM-7AM contact night-coverage If you still have difficulty reaching the appropriate provider, please page the [facility] (Director on Call) for [facility] on amion for assistance  This document was prepared using Systems analyst and may include unintentional dictation errors.  Neomia Glass DNP, MBA, FNP-BC, PMHNP-BC Nurse Practitioner [facility] Pager [phone]

## 2022-04-11 NOTE — Evaluation (Signed)
Physical Therapy Evaluation Patient Details Name: Lynn Robbins MRN: [id number] DOB: 04/09/1940 Today's Date: 04/11/2022  History of Present Illness  Pt is an 82 y/o female admitted secondary to jaw swelling and pain. Pt found to have cellulitis. PMH including but not limited to diastolic CHF, type 2 diabetes, hard of hearing, essential hypertension, depression, generalized anxiety disorder, COPD on home O2, hypothyroidism, lung cancer.   Clinical Impression  Pt presented supine in bed with HOB elevated, awake and willing to participate in therapy session. Prior to admission, pt reported that she was independent with ADLs and used a RW to ambulate. Pt stated that she lives with her son in a single level home with a few steps to enter. At the time of evaluation, pt was able to complete bed mobility with supervision, transfers with CGA and ambulated a short distance in her room with use of RW and CGA for safety. Pt appears to be at or very close to her baseline in regards to functional mobility at this time. PT will continue to follow pt acutely to progress mobility as tolerated and to ensure a safe d/c home. Pt would continue to benefit from skilled physical therapy services at this time while admitted and after d/c to address the below listed limitations in order to improve overall safety and independence with functional mobility.        Recommendations for follow up therapy are one component of a multi-disciplinary discharge planning process, led by the attending physician.  Recommendations may be updated based on patient status, additional functional criteria and insurance authorization.  Follow Up Recommendations Home health PT      Assistance Recommended at Discharge Intermittent Supervision/Assistance  Patient can return home with the following  Assistance with cooking/housework;Assist for transportation;Help with stairs or ramp for entrance    Equipment Recommendations None recommended  by PT  Recommendations for Other Services       Functional Status Assessment Patient has had a recent decline in their functional status and demonstrates the ability to make significant improvements in function in a reasonable and predictable amount of time.     Precautions / Restrictions Precautions Precautions: Fall Restrictions Weight Bearing Restrictions: No      Mobility  Bed Mobility Overal bed mobility: Needs Assistance Bed Mobility: Supine to Sit     Supine to sit: Supervision     General bed mobility comments: increased time and effort needed, use of bed rail, supervision for safety    Transfers Overall transfer level: Needs assistance Equipment used: None, Rolling walker (2 wheels) Transfers: Sit to/from Stand, Bed to chair/wheelchair/BSC Sit to Stand: Min guard, Supervision Stand pivot transfers: Min guard         General transfer comment: pt initially completing a stand-pivot transfer x2 from bed to [facility] and back without use of an AD with PT providing HHA and close CGA. Pt then completed sit<>stand transfer from EOB with use of RW and supervision for safety    Ambulation/Gait Ambulation/Gait assistance: Min guard Gait Distance (Feet): 30 Feet Assistive device: Rolling walker (2 wheels) Gait Pattern/deviations: Step-through pattern, Decreased stride length Gait velocity: decreased     General Gait Details: pt overall steady with short distance ambulation with use of RW, no instability or LOB noted, no physical assistance required  Stairs            Wheelchair Mobility    Modified Rankin (Stroke Patients Only)       Balance Overall balance assessment: Needs assistance Sitting-balance  support: Feet supported Sitting balance-Leahy Scale: Good     Standing balance support: During functional activity, Bilateral upper extremity supported, Single extremity supported Standing balance-Leahy Scale: Poor                                Pertinent Vitals/Pain Pain Assessment Pain Assessment: No/denies pain    Home Living Family/patient expects to be discharged to:: Private residence Living Arrangements: Children Available Help at Discharge: Family;Available PRN/intermittently Type of Home: House Home Access: Stairs to enter Entrance Stairs-Rails: Psychiatric nurse of Steps: 3   Home Layout: One level Home Equipment: Conservation officer, nature (2 wheels)      Prior Function Prior Level of Function : Independent/Modified Independent             Mobility Comments: pt ambulates with use of a RW as needed ADLs Comments: pt reports independence     Hand Dominance        Extremity/Trunk Assessment   Upper Extremity Assessment Upper Extremity Assessment: Generalized weakness    Lower Extremity Assessment Lower Extremity Assessment: Generalized weakness       Communication   Communication: HOH  Cognition Arousal/Alertness: Awake/alert Behavior During Therapy: Flat affect Overall Cognitive Status: Impaired/Different from baseline Area of Impairment: Problem solving                             Problem Solving: Difficulty sequencing, Requires verbal cues          General Comments      Exercises     Assessment/Plan    PT Assessment Patient needs continued PT services  PT Problem List Decreased strength;Decreased activity tolerance;Decreased balance;Decreased mobility;Decreased coordination;Decreased knowledge of use of DME;Decreased safety awareness;Decreased knowledge of precautions;Cardiopulmonary status limiting activity       PT Treatment Interventions DME instruction;Gait training;Stair training;Functional mobility training;Therapeutic activities;Therapeutic exercise;Balance training;Neuromuscular re-education;Patient/family education    PT Goals (Current goals can be found in the Care Plan section)  Acute Rehab PT Goals Patient Stated Goal: to return home PT  Goal Formulation: With patient/family Time For Goal Achievement: 04/24/22 Potential to Achieve Goals: Good    Frequency Min 3X/week     Co-evaluation               AM-PAC PT "6 Clicks" Mobility  Outcome Measure Help needed turning from your back to your side while in a flat bed without using bedrails?: None Help needed moving from lying on your back to sitting on the side of a flat bed without using bedrails?: None Help needed moving to and from a bed to a chair (including a wheelchair)?: A Little Help needed standing up from a chair using your arms (e.g., wheelchair or bedside chair)?: A Little Help needed to walk in hospital room?: A Little Help needed climbing 3-5 steps with a railing? : A Little 6 Click Score: 20    End of Session Equipment Utilized During Treatment: Oxygen Activity Tolerance: Patient limited by fatigue Patient left: in bed;with call bell/phone within reach;with bed alarm set;Other (comment) (sitting upright at EOB to eat lunch) Nurse Communication: Mobility status PT Visit Diagnosis: Other abnormalities of gait and mobility (R26.89)    Time: 1241-1311 PT Time Calculation (min) (ACUTE ONLY): 30 min   Charges:   PT Evaluation $PT Eval Moderate Complexity: 1 Mod PT Treatments $Gait Training: 8-22 mins        Eduard Clos,  PT, DPT  Acute Rehabilitation Services Office [place] 04/11/2022, 1:49 PM

## 2022-04-12 DIAGNOSIS — R918 Other nonspecific abnormal finding of lung field: Secondary | ICD-10-CM

## 2022-04-12 DIAGNOSIS — L03211 Cellulitis of face: Secondary | ICD-10-CM

## 2022-04-12 DIAGNOSIS — J449 Chronic obstructive pulmonary disease, unspecified: Secondary | ICD-10-CM | POA: Diagnosis not present

## 2022-04-12 LAB — GLUCOSE, CAPILLARY
Glucose-Capillary: 128 mg/dL — ABNORMAL HIGH (ref 70–99)
Glucose-Capillary: 118 mg/dL — ABNORMAL HIGH (ref 70–99)
Glucose-Capillary: 162 mg/dL — ABNORMAL HIGH (ref 70–99)
Glucose-Capillary: 139 mg/dL — ABNORMAL HIGH (ref 70–99)

## 2022-04-12 LAB — HEMOGLOBIN A1C
Hgb A1c MFr Bld: 5.6 % (ref 4.8–5.6)
Mean Plasma Glucose: 114 mg/dL

## 2022-04-12 MED ORDER — AMOXICILLIN-POT CLAVULANATE 875-125 MG PO TABS: 1.0000 | ORAL_TABLET | Freq: Two times a day (BID) | ORAL | 0 refills | Status: AC

## 2022-04-12 MED ORDER — APIXABAN 5 MG PO TABS: 5.0000 mg | ORAL_TABLET | Freq: Two times a day (BID) | ORAL | 2 refills | Status: DC

## 2022-04-12 NOTE — Consult Note (Signed)
Hematology/Oncology Consult note [facility] [phone] [phone]  Patient Care Team: Jonetta Osgood, NP as PCP - General (Nurse Practitioner) Telford Nab, RN as Registered Nurse Alena Bills, [facility] as Pharmacist (Pharmacist)   Name of the patient: Lynn Robbins  [id number]  1940/10/16    Reason for referral- ***   Referring physician- ***  Date of visit: '@TODAY'$ @   History of presenting illness- ***  ECOG PS- ***  Pain scale- ***   Review of systems- ROS  No Known Allergies  Patient Active Problem List   Diagnosis Date Noted   Cellulitis of jaw 04/09/2022   Chronic obstructive pulmonary disease ([place]) 09/03/2019   Supplemental oxygen dependent 09/03/2019   Bilateral hearing loss 03/09/2019   Generalized anxiety disorder 03/09/2019   Goals of care, counseling/discussion 04/18/2018   Mass of upper lobe of left lung 04/18/2018   Intermittent asthma without complication [id number]   Chronic pain disorder 06/07/2017   Essential hypertension 06/07/2017   Mixed hyperlipidemia 02/03/2017   Hypothyroidism 02/03/2017   Type 2 diabetes mellitus with hyperglycemia ([place]) 02/03/2017   Dysuria 02/03/2017     Past Medical History:  Diagnosis Date   Cancer ([place])    CHF (congestive heart failure) ([place])    COPD (chronic obstructive pulmonary disease) ([place])    Depression    Diabetes mellitus without complication ([place])    HOH (hard of hearing)    Hypertension      Past Surgical History:  Procedure Laterality Date   CESAREAN SECTION      Social History   Socioeconomic History   Marital status: Single    Spouse name: Not on file   Number of children: 5   Years of education: Not on file   Highest education level: Not on file  Occupational History   Not on file  Tobacco Use   Smoking status: Former    Packs/day: 3.00    Years: 57.00    Total pack years: 171.00    Types: Cigarettes    Quit date: 04/17/1998     Years since quitting: 24.0   Smokeless tobacco: Former    Types: Snuff  Vaping Use   Vaping Use: Never used  Substance and Sexual Activity   Alcohol use: No   Drug use: No   Sexual activity: Never  Other Topics Concern   Not on file  Social History Narrative   ** Merged History Encounter **       Social Determinants of Health   Financial Resource Strain: Low Risk  (10/03/2020)   Overall Financial Resource Strain (CARDIA)    Difficulty of Paying Living Expenses: Not very hard  Food Insecurity: No Food Insecurity (04/09/2022)   Hunger Vital Sign    Worried About Running Out of Food in the Last Year: Never true    Ran Out of Food in the Last Year: Never true  Transportation Needs: No Transportation Needs (04/09/2022)   PRAPARE - Transportation    Lack of Transportation (Medical): No    Lack of Transportation (Non-Medical): No  Physical Activity: Not on file  Stress: Not on file  Social Connections: Not on file  Intimate Partner Violence: Not At Risk (04/09/2022)   Humiliation, Afraid, Rape, and Kick questionnaire    Fear of Current or Ex-Partner: No    Emotionally Abused: No    Physically Abused: No    Sexually Abused: No     Family History  Problem Relation Age of Onset   Colon cancer Mother  Aneurysm Mother 41       brain   Diabetes Father    Colon cancer Father    Lung cancer Sister    Breast cancer Sister    Lung cancer Sister    Cancer Sister        type unknown   Lung cancer Brother    Bone cancer Brother    Lung cancer Brother    Early death Daughter    Heart disease Daughter      Current Facility-Administered Medications:    albuterol (PROVENTIL) (2.5 MG/3ML) 0.083% nebulizer solution 2.5 mg, 2.5 mg, Nebulization, Q4H PRN, Sharion Settler, NP, 2.5 mg at 04/10/22 1634   Ampicillin-Sulbactam (UNASYN) 3 g in sodium chloride 0.9 % 100 mL IVPB, 3 g, Intravenous, Q6H, Amin, Soundra Pilon, MD, Last Rate: 200 mL/hr at 04/12/22 0604, 3 g at 04/12/22 0604   atenolol  (TENORMIN) tablet 25 mg, 25 mg, Oral, Daily, Lorella Nimrod, MD, 25 mg at 04/12/22 0849   clonazePAM (KLONOPIN) tablet 1 mg, 1 mg, Oral, BID PRN, Lorella Nimrod, MD, 1 mg at 04/11/22 1038   enoxaparin (LOVENOX) injection 40 mg, 40 mg, Subcutaneous, Q24H, Jonelle Sidle, Mohammad L, MD, 40 mg at 04/11/22 2129   losartan (COZAAR) tablet 50 mg, 50 mg, Oral, Daily, 50 mg at 04/12/22 0850 **AND** hydrochlorothiazide (HYDRODIURIL) tablet 12.5 mg, 12.5 mg, Oral, Daily, Alison Murray, RPH, 12.5 mg at 04/12/22 0849   HYDROcodone-acetaminophen ([place]) 7.5-325 MG per tablet 1 tablet, 1 tablet, Oral, Q6H PRN, Lorella Nimrod, MD, 1 tablet at 04/12/22 0905   insulin aspart (novoLOG) injection 0-5 Units, 0-5 Units, Subcutaneous, QHS, Amin, Sumayya, MD   insulin aspart (novoLOG) injection 0-9 Units, 0-9 Units, Subcutaneous, TID WC, Lorella Nimrod, MD, 1 Units at 04/12/22 0849   iohexol (OMNIPAQUE) 9 MG/ML oral solution 500 mL, 500 mL, Oral, Once PRN, Lorella Nimrod, MD, 500 mL at 04/11/22 1110   morphine (PF) 2 MG/ML injection 2 mg, 2 mg, Intravenous, Q2H PRN, Gala Romney L, MD, 2 mg at 04/10/22 1247   ondansetron (ZOFRAN) tablet 4 mg, 4 mg, Oral, Q6H PRN **OR** ondansetron (ZOFRAN) injection 4 mg, 4 mg, Intravenous, Q6H PRN, Jonelle Sidle, Mohammad L, MD, 4 mg at 04/10/22 1259   pantoprazole (PROTONIX) EC tablet 40 mg, 40 mg, Oral, Daily, Lorella Nimrod, MD, 40 mg at 04/12/22 0850   promethazine (PHENERGAN) 12.5 mg in sodium chloride 0.9 % 50 mL IVPB, 12.5 mg, Intravenous, Q6H PRN, Lorella Nimrod, MD   tiotropium (SPIRIVA) inhalation capsule ([facility] use ONLY) 18 mcg, 18 mcg, Inhalation, Daily, Lorella Nimrod, MD, 18 mcg at 04/12/22 0855   Physical exam:  Vitals:   04/11/22 1536 04/11/22 2322 04/12/22 0709 04/12/22 0728  BP: (!) 157/63 (!) 133/50 (!) 147/45 126/68  Pulse: 62 72 68   Resp: '16 15 18   '$ Temp: 99 F (37.2 C) 97.9 F (36.6 C) 98.6 F (37 C)   TempSrc:   Oral   SpO2: 99% 95% 96%   Weight:      Height:        Physical Exam        Latest Ref Rng & Units 04/10/2022    4:58 AM  CMP  Glucose 70 - 99 mg/dL 111   BUN 8 - 23 mg/dL 20   Creatinine 0.44 - 1.00 mg/dL 0.67   Sodium 135 - 145 mmol/L 141   Potassium 3.5 - 5.1 mmol/L 4.8   Chloride 98 - 111 mmol/L 100   CO2 22 - 32 mmol/L 34  Calcium 8.9 - 10.3 mg/dL 8.8   Total Protein 6.5 - 8.1 g/dL 5.6   Total Bilirubin 0.3 - 1.2 mg/dL 0.4   Alkaline Phos 38 - 126 U/L 75   AST 15 - 41 U/L 13   ALT 0 - 44 U/L 11       Latest Ref Rng & Units 04/10/2022    4:58 AM  CBC  WBC 4.0 - 10.5 K/uL 7.5   Hemoglobin 12.0 - 15.0 g/dL 8.7   Hematocrit 36.0 - 46.0 % 30.3   Platelets 150 - 400 K/uL 192     '@IMAGES'$ @  CT CHEST ABDOMEN PELVIS W CONTRAST  Result Date: 04/11/2022 CLINICAL DATA:  Metastatic disease evaluation in a patient with history of lung cancer. EXAM: CT CHEST, ABDOMEN, AND PELVIS WITH CONTRAST TECHNIQUE: Multidetector CT imaging of the chest, abdomen and pelvis was performed following the standard protocol during bolus administration of intravenous contrast. RADIATION DOSE REDUCTION: This exam was performed according to the departmental dose-optimization program which includes automated exposure control, adjustment of the mA and/or kV according to patient size and/or use of iterative reconstruction technique. CONTRAST:  145m OMNIPAQUE IOHEXOL 300 MG/ML  SOLN COMPARISON:  CT chest 05/20/2021; MR abdomen 04/28/2020 and CT abdomen and pelvis 11/13/2019 FINDINGS: CT CHEST FINDINGS Cardiovascular: Eccentric nonocclusive filling defect at the distal right main pulmonary artery extending into the right upper lobe and right lower lobe pulmonary arteries compatible with pulmonary embolism. The eccentric nature suggests this may be a chronic thromboembolism. Main pulmonary artery measuring 33 mm which can be seen with pulmonary arterial hypertension. Aortic valve calcification. Normal heart size. Aortic atherosclerotic calcification. No pericardial  effusion. Mediastinum/Nodes: No mediastinal adenopathy. Unremarkable esophagus. Strandy secretions in the lower trachea. Partially visualized left submental adenopathy. See separate report from CT maxillofacial 04/09/2022 for details. Lungs/Pleura: Centrilobular emphysema. Increased size of the left upper lobe subpleural consolidative mass now measuring 8.1 x 3.7 cm in the axial plane, previously 3.1 by 1.9 cm on 11/13/2019. Increased pleural thickening in this region. Adjacent interlobular septal thickening and mild ground-glass opacities. No pleural effusion or pneumothorax. No change in 12 mm irregular nodule in the right upper lobe (4/53) or of the 4 mm nodule in the right lower lobe (4/76). Musculoskeletal: Pathologic fractures and bony destruction of the lateral left third and fourth ribs secondary to invasion from the adjacent lung mass. CT ABDOMEN PELVIS FINDINGS Hepatobiliary: No suspicious focal hepatic lesion. Tiny cyst along the gallbladder fossa is unchanged from MRI 04/28/2020. Unremarkable gallbladder and biliary tree. Pancreas: Severe pancreatic atrophy with multicystic dilation of the main pancreatic duct and multiple dilated side ducts is grossly similar to MRI 04/28/2020. Calcifications within the pancreatic parenchyma consistent with sequela of chronic pancreatitis. Spleen: Unremarkable. Adrenals/Urinary Tract: No new or enlarging adrenal lesion. Bilateral renal cysts are stable from 04/28/2020 and no additional follow-up is recommended. Bulky calcification in the right renal pelvis measuring 15 mm in maximum dimension. Additional nonobstructing right renal calculi. No hydronephrosis or hydroureter. Cortical renal scarring left kidney. Unremarkable bladder. Stomach/Bowel: Normal caliber large and small bowel. Colonic diverticulosis without diverticulitis. Moderate colonic stool load. No bowel wall thickening. Unremarkable stomach. Enteric contrast is present within the distal small bowel. Normal  appendix. Vascular/Lymphatic: Aortic atherosclerotic calcification. No aneurysm. No adenopathy in the abdomen or pelvis. Reproductive: Uterus and bilateral adnexa are unremarkable. Other: No free intraperitoneal fluid or air. Musculoskeletal: No acute fracture or aggressive osseous lesion. Advanced thoracolumbar spondylosis. Ankylosis of L4-S1. Benign hemangioma T7. IMPRESSION: 1. Increased size  of the left upper lobe subpleural mass now measuring up to 8.1 cm, previously 3.1 cm on 11/13/2019. 2. Pathologic fractures and bony destruction of the lateral left third and fourth ribs secondary to invasion from the adjacent lung mass. 3. Eccentric nonocclusive filling defect at the distal right main pulmonary artery extending into the right upper lobe and right lower lobe pulmonary arteries compatible with pulmonary embolism. The eccentric nature suggests this may be a chronic thromboembolism. 4. No evidence of metastatic disease in the abdomen or pelvis. 5. Severe pancreatic atrophy with multicystic dilation of the main pancreatic duct and multiple dilated side ducts is grossly similar to MRI 04/28/2020. 6. Right nephrolithiasis measuring up to 15 mm. No hydronephrosis or obstructing calculi. Aortic Atherosclerosis (ICD10-I70.0) and Emphysema (ICD10-J43.9). These results were called by telephone at the time of interpretation on 04/11/2022 at 9:39 pm to provider Jerrye Bushy, who verbally acknowledged these results. Electronically Signed   By: Placido Sou M.D.   On: 04/11/2022 22:09   CT Maxillofacial W Contrast  Result Date: 04/09/2022 CLINICAL DATA:  Sublingual/submandibular abscess EXAM: CT MAXILLOFACIAL WITH CONTRAST TECHNIQUE: Multidetector CT imaging of the maxillofacial structures was performed with intravenous contrast. Multiplanar CT image reconstructions were also generated. RADIATION DOSE REDUCTION: This exam was performed according to the departmental dose-optimization program which includes automated  exposure control, adjustment of the mA and/or kV according to patient size and/or use of iterative reconstruction technique. CONTRAST:  67m OMNIPAQUE IOHEXOL 300 MG/ML  SOLN COMPARISON:  None Available. FINDINGS: Osseous: No fracture or mandibular dislocation. No destructive process. Orbits: Negative. No traumatic or inflammatory finding. Sinuses: Paranasal sinus mucosal thickening. Soft tissues: Edema and enlarged lymph nodes in the left submandibular space. The adjacent submandibular gland is unremarkable. No evidence of ductal dilation or calculus. No discrete, drainable fluid collection. Inflamed and thickened left buccal mucosa along the mandible, possibly infectious. Limited intracranial: No significant or unexpected finding. IMPRESSION: 1. Edema and enlarged lymph nodes in the left submandibular space, nonspecific but suggestive of cellulitis and lymphadenitis. The adjacent submandibular gland is unremarkable. No evidence of ductal dilation or calculus. No discrete, drainable fluid collection. 2. Inflamed and thickened left buccal mucosa along the mandible, probably infectious given the above findings. Correlate with direct inspection to exclude malignancy. Electronically Signed   By: FMargaretha SheffieldM.D.   On: 04/09/2022 16:39    Assessment and plan- Patient is a 82y.o. female ***   Thank you for this kind referral and the opportunity to participate in the care of this  Patient   Visit Diagnosis 1. Facial cellulitis   2. Medication refill     Dr. ARanda Evens MD, MPH CNorth [facility] Suit[facility] LLCat [id number]/26/2024

## 2022-04-12 NOTE — Discharge Summary (Signed)
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 Follow-up with oncology within next few days Follow-up with primary care provider  Discharge Diagnoses: Principal Problem:   Cellulitis of jaw Active Problems:   Chronic obstructive pulmonary disease (HCC)   Supplemental oxygen dependent   Type 2 diabetes mellitus with hyperglycemia (HCC)   Essential hypertension   Hypothyroidism   Mixed hyperlipidemia   Goals of care, counseling/discussion   Mass of upper lobe of left lung   Hospital Course: Taken from H&P.   Lynn Robbins is a 82 y.o. female with medical history significant of diastolic CHF, type 2 diabetes, hard of hearing, essential hypertension, depression, generalized anxiety disorder, COPD on home O2, hypothyroidism, lung cancer, who presented to the ER with pain and swelling of the left face as well as redness which started about 3 to 4 days.  This has extended to below the left jaw.  Denied any dental problems.  Denied any bite or injury.  Patient was noted to have low-grade temperature 100.2.  Also facial swelling consistent with cellulitis of the face.   2/24: Patient maxillofacial CT with edema and enlarged lymph node in the left submandibular space, nonspecific but suggestive of cellulitis and lymphadenitis.  No drainable fluid collection noted.  Inflamed and thickened left buccal mucosa along the mandible, probably infectious given the above finding.  Correlate with direct inspection to exclude malignancy. Patient received vancomycin and cefepime in ED and continued on cefepime only.  2/24: Vital stable, mildly elevated  blood pressure at 144/46.  AKI has been resolved.  Hemoglobin decreased to 8.7 without any obvious bleeding, all cell lines decreased, most likely some dilutional effect.  Switching cefepime with Unasyn.  2/25: Vital stable.  Patient is medically stable for discharge but now family wants LTC placement??  Had a long discussion with daughter, son-in-law and son at bedside.  Daughter even contacted her oncologist, Dr. Janese Banks in after hours, they were extremely worried and pretty much sure that they were told that patient has multiple metastasis and is going to die, not sure how they get that perception.  Radiology noted mention ruling out underlying metastatic lesion based on her history of lung cancer.  They were concern of metastatic pancreatic cancer with no tissue diagnosis, patient has chronic pancreatitis with multiple cysts which seems stable up till prior CT done a year ago.  Had discussion with Dr. Janese Banks and she advised doing CT chest, abdomen and pelvis with contrast to rule out or confirm any metastatic lesions.  Daughter even consulted [facility] herself.  Patient and family does not even want DNR, she is not hospice eligible at this time.  CT chest and abdomen was ordered.  Also ordered PT evaluation as family was extremely concerned that they cannot take care of her at home. At the time of exam patient was very anxious and crying that she is not getting her home medications and she cannot breathe, told nursing staff to give her as needed Klonopin to decrease the anxiety.  There was no wheezing and she was saturating well on her baseline oxygen requirement.  It appears that patient do have some  cognitive impairment with some other underlying psych issues and anxiety.  2/26; vitals stable.  CT chest abdomen and pelvis with increased size of left upper lobe subpleural mass which causes invasion and destruction of left third and fourth rib.  Also noted to have an noninclusive filling defect which may be  suggestive of chronic thromboembolism.  No evidence of metastatic disease in abdominal pelvis.  Severe pancreatic atrophy with multiple cystic dilations of the main pancreatic duct and multiple dilated side ducts which remain stable-findings more consistent with chronic pancreatitis.  Also found to have right nephrolithiasis, nonobstructing and no hydronephrosis.  Dr. Janese Banks from oncology saw her in the room and advised starting Eliquis at 5 mg twice daily for concern of chronic PE and with history of active malignancy.  They will see her as an outpatient for further discussion and management of her enlarging left lung mass. She is also being given Augmentin for 4 more days to complete the course of concern of jaw cellulitis.  We ordered maximum home health services to assist the family and patient. Patient is at baseline for mobility so PT is recommending home health.  If family wants a long-term care they need to look for it as outpatient.  Patient will continue with rest of her home medications and need to have a close follow-up with her providers for further management.  Addendum.  We ordered maximum home health services but family would like her to go to a long-term facility, they are unable to provide required supervision.  Likely find 1 LTC bed where patient is being discharged for further care and management.  She will follow-up with her providers for further management.  Assessment and Plan: * Cellulitis of jaw No evidence of any abscess.  Some submandibular lymph node involvement. Patient received cefepime and vancomycin initially.  Preliminary blood cultures negative.  Clinically seems improving. -Continue with Unasyn today-can be switched to Augmentin on discharge. -Patient need to follow-up with her oncologist to rule out any underlying mandibular bone lesion as an outpatient. -Also ordered CT chest, abdomen and pelvis as advised by Dr. Janese Banks to decrease the family anxiety.  Supplemental  oxygen dependent Due to above. -Continue with home oxygen  Chronic obstructive pulmonary disease (HCC) No concern of acute exacerbation.  Remained on baseline oxygen requirement of 2 to 3 L. -Continue with home bronchodilator  Type 2 diabetes mellitus with hyperglycemia (HCC) -SSI  Essential hypertension Mildly elevated blood pressure. -Restart home atenolol and Hyzaar  Hypothyroidism Not on any thyroid supplement at home. -TSH normal.  Mixed hyperlipidemia Not on any statin at home  Goals of care, counseling/discussion Discussed with patient and confirmed with daughter that patient would like to remain full code with full scope of care. Family is becoming very anxious with concern of metastatic disease which was mentioned by radiologist on CT. they were asking about hospice but does not want to change the CODE STATUS. -Had a very long and lengthy discussion with multiple family members. 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 Per daughter there was a small unchanged cystic lesion in pancreas now which is being monitored with no definitive diagnosis. -Continue follow-up with outpatient oncology   Pain control - [facility] Controlled Substance Reporting System database was reviewed. and patient was instructed, not to drive, operate heavy machinery, perform activities at heights, swimming or participation in water activities or provide baby-sitting services  while on Pain, Sleep and Anxiety Medications; until their outpatient Physician has advised to do so again. Also recommended to not to take more than prescribed Pain, Sleep and Anxiety Medications.  Consultants: Oncology Procedures performed: None Disposition: Home health Diet recommendation:  Discharge Diet Orders (From admission, onward)     Start     Ordered   04/12/22 0000  Diet - low sodium heart healthy        04/12/22  1259           Cardiac and Carb modified diet DISCHARGE MEDICATION: Allergies as of 04/13/2022   No Known Allergies      Medication List     STOP taking these medications    azithromycin 250 MG tablet Commonly known as: ZITHROMAX   levofloxacin 500 MG tablet Commonly known as: LEVAQUIN   predniSONE 10 MG tablet Commonly known as: DELTASONE       TAKE these medications    Accu-Chek FastClix Lancets Misc Use as directed to check sugars once daily. DX E11.65   Accu-Chek Guide test strip Generic drug: glucose blood Use as instructed to check blood sugars once daily DX: E11.65   albuterol 108 (90 Base) MCG/ACT inhaler Commonly known as: VENTOLIN HFA Inhale 2 puffs into the lungs every 6 (six) hours as needed for wheezing or shortness of breath.   amoxicillin-clavulanate 875-125 MG tablet Commonly known as: AUGMENTIN Take 1 tablet by mouth 2 (two) times daily for 4 days.   apixaban 5 MG Tabs tablet Commonly known as: ELIQUIS Take 1 tablet (5 mg total) by mouth 2 (two) times daily.   atenolol 25 MG tablet Commonly known as: TENORMIN Take 1 tablet (25 mg total) by mouth daily.   clonazePAM 1 MG tablet Commonly known as: KLONOPIN Take 1 tablet (1 mg total) by mouth 2 (two) times daily as needed. for anxiety   GI Cocktail (alum & mag hydroxide, lidocaine, dicyclomine) oral mixture Take 10 mLs by mouth 2 (two) times daily as needed (abdominal pain).   glimepiride 1 MG tablet Commonly known as: AMARYL TAKE 1 TABLET BY MOUTH DAILY WITH BREAKFAST   HYDROcodone-acetaminophen 7.5-325 MG tablet Commonly known as: NORCO TAKE ONE TABLET BY MOUTH TWICE DAILY AS NEEDED FOR MODERATE OR SEVERE PAIN   Janumet XR 50-500 MG Tb24 Generic drug: SitaGLIPtin-MetFORMIN HCl TAKE ONE TABLET EVERY DAY WITH FOOD FOR DIABETES   losartan-hydrochlorothiazide 50-12.5 MG tablet Commonly known as: HYZAAR TAKE 1 TABLET BY MOUTH ONCE DAILY   ondansetron 4 MG disintegrating  tablet Commonly known as: ZOFRAN-ODT Take 1 tablet (4 mg total) by mouth every 8 (eight) hours as needed for nausea or vomiting.   OXYGEN Inhale into the lungs.   pantoprazole 40 MG tablet Commonly known as: PROTONIX Take 1 tablet (40 mg total) by mouth daily.   Spiriva HandiHaler 18 MCG inhalation capsule Generic drug: tiotropium INHALE 1 CAPSULE VIA HANDIHALER ONCE DAILY AT THE SAME TIME EVERY DAY        Contact information for follow-up providers     Lynn Osgood, NP. Go on 04/15/2022.   Specialty: Nurse Practitioner Why: @ 1:20pm. Contact information: [place] [address] [phone]         Sindy Guadeloupe, MD. Go on 04/15/2022.   Specialty: Oncology Why: @ 3pm. Contact information: [place] [address] [phone]              Contact information for after-discharge care     Destination  [facility] Preferred SNF .   Service: Skilled Nursing Contact information: [address] [place] [phone]                    Discharge Exam: Danley Danker Weights   04/09/22 1344  Weight: 74.4 kg   General.     In no acute distress. Pulmonary.  Lungs clear bilaterally, normal respiratory effort. CV.  Regular rate and rhythm, no JVD, rub or murmur. Abdomen.  Soft, nontender, nondistended, BS positive. CNS.  Alert and oriented .  No focal neurologic deficit. Extremities.  No edema, no cyanosis, pulses intact and symmetrical. Psychiatry.  Appears to have some cognitive impairment  Condition at discharge: stable  The results of significant diagnostics from this hospitalization (including imaging, microbiology, ancillary and laboratory) are listed below for reference.   Imaging Studies: CT CHEST ABDOMEN PELVIS W CONTRAST  Result Date: 04/11/2022 CLINICAL DATA:  Metastatic disease evaluation in a patient with history of lung cancer.  EXAM: CT CHEST, ABDOMEN, AND PELVIS WITH CONTRAST TECHNIQUE: Multidetector CT imaging of the chest, abdomen and pelvis was performed following the standard protocol during bolus administration of intravenous contrast. RADIATION DOSE REDUCTION: This exam was performed according to the departmental dose-optimization program which includes automated exposure control, adjustment of the mA and/or kV according to patient size and/or use of iterative reconstruction technique. CONTRAST:  160m OMNIPAQUE IOHEXOL 300 MG/ML  SOLN COMPARISON:  CT chest 05/20/2021; MR abdomen 04/28/2020 and CT abdomen and pelvis 11/13/2019 FINDINGS: CT CHEST FINDINGS Cardiovascular: Eccentric nonocclusive filling defect at the distal right main pulmonary artery extending into the right upper lobe and right lower lobe pulmonary arteries compatible with pulmonary embolism. The eccentric nature suggests this may be a chronic thromboembolism. Main pulmonary artery measuring 33 mm which can be seen with pulmonary arterial hypertension. Aortic valve calcification. Normal heart size. Aortic atherosclerotic calcification. No pericardial effusion. Mediastinum/Nodes: No mediastinal adenopathy. Unremarkable esophagus. Strandy secretions in the lower trachea. Partially visualized left submental adenopathy. See separate report from CT maxillofacial 04/09/2022 for details. Lungs/Pleura: Centrilobular emphysema. Increased size of the left upper lobe subpleural consolidative mass now measuring 8.1 x 3.7 cm in the axial plane, previously 3.1 by 1.9 cm on 11/13/2019. Increased pleural thickening in this region. Adjacent interlobular septal thickening and mild ground-glass opacities. No pleural effusion or pneumothorax. No change in 12 mm irregular nodule in the right upper lobe (4/53) or of the 4 mm nodule in the right lower lobe (4/76). Musculoskeletal: Pathologic fractures and bony destruction of the lateral left third and fourth ribs secondary to invasion from  the adjacent lung mass. CT ABDOMEN PELVIS FINDINGS Hepatobiliary: No suspicious focal hepatic lesion. Tiny cyst along the gallbladder fossa is unchanged from MRI 04/28/2020. Unremarkable gallbladder and biliary tree. Pancreas: Severe pancreatic atrophy with multicystic dilation of the main pancreatic duct and multiple dilated side ducts is grossly similar to MRI 04/28/2020. Calcifications within the pancreatic parenchyma consistent with sequela of chronic pancreatitis. Spleen: Unremarkable. Adrenals/Urinary Tract: No new or enlarging adrenal lesion. Bilateral renal cysts are stable from 04/28/2020 and no additional follow-up is recommended. Bulky calcification in the right renal pelvis measuring 15 mm in maximum dimension. Additional nonobstructing right renal calculi. No hydronephrosis or hydroureter. Cortical renal scarring left kidney. Unremarkable bladder. Stomach/Bowel: Normal caliber large and small bowel. Colonic diverticulosis without diverticulitis. Moderate colonic stool load. No bowel wall thickening. Unremarkable stomach. Enteric contrast is present within the distal small bowel. Normal appendix. Vascular/Lymphatic: Aortic  atherosclerotic calcification. No aneurysm. No adenopathy in the abdomen or pelvis. Reproductive: Uterus and bilateral adnexa are unremarkable. Other: No free intraperitoneal fluid or air. Musculoskeletal: No acute fracture or aggressive osseous lesion. Advanced thoracolumbar spondylosis. Ankylosis of L4-S1. Benign hemangioma T7. IMPRESSION: 1. Increased size of the left upper lobe subpleural mass now measuring up to 8.1 cm, previously 3.1 cm on 11/13/2019. 2. Pathologic fractures and bony destruction of the lateral left third and fourth ribs secondary to invasion from the adjacent lung mass. 3. Eccentric nonocclusive filling defect at the distal right main pulmonary artery extending into the right upper lobe and right lower lobe pulmonary arteries compatible with pulmonary embolism.  The eccentric nature suggests this may be a chronic thromboembolism. 4. No evidence of metastatic disease in the abdomen or pelvis. 5. Severe pancreatic atrophy with multicystic dilation of the main pancreatic duct and multiple dilated side ducts is grossly similar to MRI 04/28/2020. 6. Right nephrolithiasis measuring up to 15 mm. No hydronephrosis or obstructing calculi. Aortic Atherosclerosis (ICD10-I70.0) and Emphysema (ICD10-J43.9). These results were called by telephone at the time of interpretation on 04/11/2022 at 9:39 pm to provider Jerrye Bushy, who verbally acknowledged these results. Electronically Signed   By: Placido Sou M.D.   On: 04/11/2022 22:09   CT Maxillofacial W Contrast  Result Date: 04/09/2022 CLINICAL DATA:  Sublingual/submandibular abscess EXAM: CT MAXILLOFACIAL WITH CONTRAST TECHNIQUE: Multidetector CT imaging of the maxillofacial structures was performed with intravenous contrast. Multiplanar CT image reconstructions were also generated. RADIATION DOSE REDUCTION: This exam was performed according to the departmental dose-optimization program which includes automated exposure control, adjustment of the mA and/or kV according to patient size and/or use of iterative reconstruction technique. CONTRAST:  37m OMNIPAQUE IOHEXOL 300 MG/ML  SOLN COMPARISON:  None Available. FINDINGS: Osseous: No fracture or mandibular dislocation. No destructive process. Orbits: Negative. No traumatic or inflammatory finding. Sinuses: Paranasal sinus mucosal thickening. Soft tissues: Edema and enlarged lymph nodes in the left submandibular space. The adjacent submandibular gland is unremarkable. No evidence of ductal dilation or calculus. No discrete, drainable fluid collection. Inflamed and thickened left buccal mucosa along the mandible, possibly infectious. Limited intracranial: No significant or unexpected finding. IMPRESSION: 1. Edema and enlarged lymph nodes in the left submandibular space, nonspecific  but suggestive of cellulitis and lymphadenitis. The adjacent submandibular gland is unremarkable. No evidence of ductal dilation or calculus. No discrete, drainable fluid collection. 2. Inflamed and thickened left buccal mucosa along the mandible, probably infectious given the above findings. Correlate with direct inspection to exclude malignancy. Electronically Signed   By: FMargaretha SheffieldM.D.   On: 04/09/2022 16:39    Microbiology: Results for orders placed or performed during the hospital encounter of 04/09/22  Blood culture (routine x 2)     Status: None (Preliminary result)   Collection Time: 04/09/22  1:49 PM   Specimen: BLOOD  Result Value Ref Range Status   Specimen Description BLOOD LEFT ANTECUBITAL  Final   Special Requests   Final    BOTTLES DRAWN AEROBIC AND ANAEROBIC Blood Culture adequate volume   Culture   Final    NO GROWTH 4 DAYS [facility] [address] [place] [address]   Report Status PENDING  Incomplete  Blood culture (routine x 2)     Status: None (Preliminary result)   Collection Time: 04/09/22  2:03 PM   Specimen: BLOOD  Result Value Ref Range Status   Specimen Description BLOOD RIGHT ANTECUBITAL  Final  Special Requests   Final    BOTTLES DRAWN AEROBIC AND ANAEROBIC Blood Culture adequate volume   Culture   Final    NO GROWTH 4 DAYS Performed at [facility], [address] [place], [place] [address]    Report Status PENDING  Incomplete    Labs: CBC: Recent Labs  Lab 04/09/22 1349 04/10/22 0458  WBC 8.6 7.5  HGB 10.0* 8.7*  HCT 35.8* 30.3*  MCV 108.5* 105.9*  PLT 234 [id number]   Basic Metabolic Panel: Recent Labs  Lab 04/09/22 1349 04/10/22 0458  NA 145 141  K 5.1 4.8  CL 102 100  CO2 36* 34*  GLUCOSE 136* 111*  BUN 24* 20  CREATININE 1.23* 0.67  CALCIUM 9.4 8.8*   Liver Function Tests: Recent Labs  Lab 04/09/22 1349 04/10/22 0458  AST 15 13*  ALT 13 11  ALKPHOS 96 75  BILITOT 0.3  0.4  PROT 6.9 5.6*  ALBUMIN 3.2* 2.7*   CBG: Recent Labs  Lab 04/12/22 0804 04/12/22 1243 04/12/22 1607 04/12/22 2124 04/13/22 0801  GLUCAP 128* 118* 162* 139* 145*    Discharge time spent: greater than 30 minutes.  This record has been created using Systems analyst. Errors have been sought and corrected,but may not always be located. Such creation errors do not reflect on the standard of care.   Signed: Lorella Nimrod, MD [facility] 04/13/2022

## 2022-04-12 NOTE — TOC Progression Note (Signed)
Transition of Care [facility]) - Progression Note    Patient Details  Name: WAYNESHA BESCH MRN: [id number] Date of Birth: Jun 14, 1940  Transition of Care [facility]) CM/SW [place], RN Phone Number: 04/12/2022, 1:29 PM  Clinical Narrative:        Pulled the PASSR [id number]  Debra at [facility] is checking to see if they can accept the patient for long term care and will let me know  Expected Discharge Plan and Services         Expected Discharge Date: 04/12/22                                     Social Determinants of Health (SDOH) Interventions SDOH Screenings   Food Insecurity: No Food Insecurity (04/09/2022)  Housing: Low Risk  (04/09/2022)  Transportation Needs: No Transportation Needs (04/09/2022)  Utilities: Not At Risk (04/09/2022)  Alcohol Screen: Low Risk  (07/22/2021)  Depression (PHQ2-9): Low Risk  (01/19/2021)  Financial Resource Strain: Low Risk  (10/03/2020)  Tobacco Use: Medium Risk (04/09/2022)    Readmission Risk Interventions     No data to display

## 2022-04-12 NOTE — TOC Progression Note (Signed)
Transition of Care [facility] - Progression Note    Patient Details  Name: Lynn Robbins MRN: [id number] Date of Birth: May 29, 1940  Transition of Care [facility]) CM/SW [place], RN Phone Number: 04/12/2022, 1:19 PM  Clinical Narrative:    Spoke with the patient's daughter and  family in the room The patient is on oxygen I explained the Long term process for finding a LTC bed in a facility, They report that the patient already has medicaid, I explained that The family may be responsible for finding a long term bed if we were not able to find one prior to DC I explained that Medicaid beds are very limited, they are agreeable to go outside of the county if needed. I expanded the long term bed search I spoke with Tonya at [facility] and asked if they could review for a long term bed, she will let me know if they can offer a bed in a few min        Expected Discharge Plan and Services         Expected Discharge Date: 04/12/22                                     Social Determinants of Health (SDOH) Interventions SDOH Screenings   Food Insecurity: No Food Insecurity (04/09/2022)  Housing: Low Risk  (04/09/2022)  Transportation Needs: No Transportation Needs (04/09/2022)  Utilities: Not At Risk (04/09/2022)  Alcohol Screen: Low Risk  (07/22/2021)  Depression (PHQ2-9): Low Risk  (01/19/2021)  Financial Resource Strain: Low Risk  (10/03/2020)  Tobacco Use: Medium Risk (04/09/2022)    Readmission Risk Interventions     No data to display

## 2022-04-12 NOTE — TOC Progression Note (Addendum)
Transition of Care [facility]) - Progression Note    Patient Details  Name: Lynn Robbins MRN: [id number] Date of Birth: 11-25-1940  Transition of Care [facility]) CM/SW [place], RN Phone Number: 04/12/2022, 1:52 PM  Clinical Narrative:    Spoke with Loree Fee, [facility] can make a long term bed offer with a LOG, I reached out to Emerado the [employer] to get the LOG and email it to him for approval, fax number [phone] Family accepted the bed offer, LOG was received by [place], family to transport       Expected Discharge Plan and Services         Expected Discharge Date: 04/12/22                                     Social Determinants of Health ([place]) Interventions SDOH Screenings   Food Insecurity: No Food Insecurity (04/09/2022)  Housing: Low Risk  (04/09/2022)  Transportation Needs: No Transportation Needs (04/09/2022)  Utilities: Not At Risk (04/09/2022)  Alcohol Screen: Low Risk  (07/22/2021)  Depression (PHQ2-9): Low Risk  (01/19/2021)  Financial Resource Strain: Low Risk  (10/03/2020)  Tobacco Use: Medium Risk (04/09/2022)    Readmission Risk Interventions     No data to display

## 2022-04-12 NOTE — Care Management Important Message (Signed)
Important Message  Patient Details  Name: Lynn Robbins MRN: [id number] Date of Birth: [birth year]   Medicare Important Message Given:  N/A - LOS <3 / Initial given by admissions     [name] 04/12/2022, 11:07 AM

## 2022-04-12 NOTE — Progress Notes (Signed)
   04/12/22 0900  Spiritual Encounters  Type of Visit Initial  Care provided to: Patient  Conversation partners present during encounter Nurse  Referral source Nurse (RN/NT/LPN)  Reason for visit Advance directives  OnCall Visit Yes   Chaplain responded to nurse consult. Chaplain provided compassionate presence and reflective listening as patient spoke about health challenges. Chaplain also provided education regarding advance directives and Chaplain services. Patient appreciated [place] visit.

## 2022-04-13 LAB — GLUCOSE, CAPILLARY: Glucose-Capillary: 145 mg/dL — ABNORMAL HIGH (ref 70–99)

## 2022-04-13 MED ORDER — HYDROCODONE-ACETAMINOPHEN 7.5-325 MG PO TABS: ORAL_TABLET | ORAL | 0 refills | Status: DC

## 2022-04-13 NOTE — Care Management Important Message (Signed)
Important Message  Patient Details  Name: Lynn Robbins MRN: [id number] Date of Birth: 10-08-1940   Medicare Important Message Given:  Yes     Juliann Pulse A Solace Manwarren 04/13/2022, 9:42 AM

## 2022-04-13 NOTE — Plan of Care (Signed)

## 2022-04-13 NOTE — Progress Notes (Signed)
  Chaplain On-Call responded to call from Unit Secretary Dace who reported patient's readiness to complete Advance Directives.  Chaplain met patient and daughter Levander Campion, and friend Yvone Neu.  The HCPOA document was filled in appropriately by patient, naming Dianne as her HCPOA. Chaplain received assistance from [place] as Witnesses to [employer], and Employee Notary [employer].  Chaplain made a copy of the A.D. and placed it in patient's binder notebook. Chaplain gave original document and a copy to patient.  Chaplain Pollyann Samples M.Div., [facility]

## 2022-04-13 NOTE — TOC Progression Note (Addendum)
Transition of Care [facility]) - Progression Note    Patient Details  Name: Lynn Robbins MRN: [id number] Date of Birth: 07-27-1940  Transition of Care [facility]) CM/SW [place], RN Phone Number: 04/13/2022, 11:03 AM  Clinical Narrative:     Patient to DC today to [facility] with Family to transport [place] summary to [place], patient has Oxygen and they will have her portable tank from home to transport Going to room A28-1       Expected Discharge Plan and Services         Expected Discharge Date: 04/13/22                                     Social Determinants of Health (SDOH) Interventions SDOH Screenings   Food Insecurity: No Food Insecurity (04/09/2022)  Housing: Low Risk  (04/09/2022)  Transportation Needs: No Transportation Needs (04/09/2022)  Utilities: Not At Risk (04/09/2022)  Alcohol Screen: Low Risk  (07/22/2021)  Depression (PHQ2-9): Low Risk  (01/19/2021)  Financial Resource Strain: Low Risk  (10/03/2020)  Tobacco Use: Medium Risk (04/09/2022)    Readmission Risk Interventions     No data to display

## 2022-04-14 DIAGNOSIS — R799 Abnormal finding of blood chemistry, unspecified: Secondary | ICD-10-CM | POA: Diagnosis not present

## 2022-04-14 LAB — CULTURE, BLOOD (ROUTINE X 2)
Culture: NO GROWTH
Culture: NO GROWTH
Special Requests: ADEQUATE
Special Requests: ADEQUATE

## 2022-04-15 ENCOUNTER — Inpatient Hospital Stay: Payer: 59 | Admitting: Nurse Practitioner

## 2022-04-15 DIAGNOSIS — L03211 Cellulitis of face: Secondary | ICD-10-CM | POA: Diagnosis not present

## 2022-04-15 DIAGNOSIS — J9611 Chronic respiratory failure with hypoxia: Secondary | ICD-10-CM | POA: Diagnosis not present

## 2022-04-15 DIAGNOSIS — I10 Essential (primary) hypertension: Secondary | ICD-10-CM | POA: Diagnosis not present

## 2022-04-15 DIAGNOSIS — C3411 Malignant neoplasm of upper lobe, right bronchus or lung: Secondary | ICD-10-CM | POA: Diagnosis not present

## 2022-04-15 DIAGNOSIS — E039 Hypothyroidism, unspecified: Secondary | ICD-10-CM | POA: Diagnosis not present

## 2022-04-15 DIAGNOSIS — E785 Hyperlipidemia, unspecified: Secondary | ICD-10-CM | POA: Diagnosis not present

## 2022-04-15 DIAGNOSIS — E119 Type 2 diabetes mellitus without complications: Secondary | ICD-10-CM | POA: Diagnosis not present

## 2022-04-15 DIAGNOSIS — K862 Cyst of pancreas: Secondary | ICD-10-CM | POA: Diagnosis not present

## 2022-04-16 DIAGNOSIS — J9611 Chronic respiratory failure with hypoxia: Secondary | ICD-10-CM | POA: Diagnosis not present

## 2022-04-16 DIAGNOSIS — E039 Hypothyroidism, unspecified: Secondary | ICD-10-CM | POA: Diagnosis not present

## 2022-04-16 DIAGNOSIS — L03211 Cellulitis of face: Secondary | ICD-10-CM | POA: Diagnosis not present

## 2022-04-16 DIAGNOSIS — I10 Essential (primary) hypertension: Secondary | ICD-10-CM | POA: Diagnosis not present

## 2022-04-16 DIAGNOSIS — E119 Type 2 diabetes mellitus without complications: Secondary | ICD-10-CM | POA: Diagnosis not present

## 2022-04-16 DIAGNOSIS — C3411 Malignant neoplasm of upper lobe, right bronchus or lung: Secondary | ICD-10-CM | POA: Diagnosis not present

## 2022-04-16 DIAGNOSIS — K862 Cyst of pancreas: Secondary | ICD-10-CM | POA: Diagnosis not present

## 2022-04-16 DIAGNOSIS — E785 Hyperlipidemia, unspecified: Secondary | ICD-10-CM | POA: Diagnosis not present

## 2022-04-19 DIAGNOSIS — E039 Hypothyroidism, unspecified: Secondary | ICD-10-CM | POA: Diagnosis not present

## 2022-04-19 DIAGNOSIS — I10 Essential (primary) hypertension: Secondary | ICD-10-CM | POA: Diagnosis not present

## 2022-04-19 DIAGNOSIS — L03211 Cellulitis of face: Secondary | ICD-10-CM | POA: Diagnosis not present

## 2022-04-19 DIAGNOSIS — E785 Hyperlipidemia, unspecified: Secondary | ICD-10-CM | POA: Diagnosis not present

## 2022-04-19 DIAGNOSIS — E119 Type 2 diabetes mellitus without complications: Secondary | ICD-10-CM | POA: Diagnosis not present

## 2022-04-19 DIAGNOSIS — J9611 Chronic respiratory failure with hypoxia: Secondary | ICD-10-CM | POA: Diagnosis not present

## 2022-04-19 DIAGNOSIS — K862 Cyst of pancreas: Secondary | ICD-10-CM | POA: Diagnosis not present

## 2022-04-19 DIAGNOSIS — C3411 Malignant neoplasm of upper lobe, right bronchus or lung: Secondary | ICD-10-CM | POA: Diagnosis not present

## 2022-04-20 DIAGNOSIS — E1165 Type 2 diabetes mellitus with hyperglycemia: Secondary | ICD-10-CM | POA: Diagnosis not present

## 2022-04-20 DIAGNOSIS — J449 Chronic obstructive pulmonary disease, unspecified: Secondary | ICD-10-CM | POA: Diagnosis not present

## 2022-04-20 DIAGNOSIS — I10 Essential (primary) hypertension: Secondary | ICD-10-CM | POA: Diagnosis not present

## 2022-04-20 DIAGNOSIS — E039 Hypothyroidism, unspecified: Secondary | ICD-10-CM | POA: Diagnosis not present

## 2022-04-22 ENCOUNTER — Telehealth: Payer: Self-pay | Admitting: *Deleted

## 2022-04-22 ENCOUNTER — Inpatient Hospital Stay: Payer: 59 | Attending: Nurse Practitioner | Admitting: Nurse Practitioner

## 2022-04-22 ENCOUNTER — Encounter: Payer: Self-pay | Admitting: Nurse Practitioner

## 2022-04-22 DIAGNOSIS — Z803 Family history of malignant neoplasm of breast: Secondary | ICD-10-CM | POA: Insufficient documentation

## 2022-04-22 DIAGNOSIS — M549 Dorsalgia, unspecified: Secondary | ICD-10-CM | POA: Insufficient documentation

## 2022-04-22 DIAGNOSIS — Z7901 Long term (current) use of anticoagulants: Secondary | ICD-10-CM | POA: Insufficient documentation

## 2022-04-22 DIAGNOSIS — Z800 Family history of malignant neoplasm of digestive organs: Secondary | ICD-10-CM | POA: Insufficient documentation

## 2022-04-22 DIAGNOSIS — R52 Pain, unspecified: Secondary | ICD-10-CM | POA: Diagnosis not present

## 2022-04-22 DIAGNOSIS — Z801 Family history of malignant neoplasm of trachea, bronchus and lung: Secondary | ICD-10-CM | POA: Insufficient documentation

## 2022-04-22 DIAGNOSIS — Z87891 Personal history of nicotine dependence: Secondary | ICD-10-CM | POA: Insufficient documentation

## 2022-04-22 DIAGNOSIS — Z9981 Dependence on supplemental oxygen: Secondary | ICD-10-CM | POA: Insufficient documentation

## 2022-04-22 DIAGNOSIS — E119 Type 2 diabetes mellitus without complications: Secondary | ICD-10-CM | POA: Insufficient documentation

## 2022-04-22 DIAGNOSIS — I5030 Unspecified diastolic (congestive) heart failure: Secondary | ICD-10-CM | POA: Insufficient documentation

## 2022-04-22 DIAGNOSIS — I110 Hypertensive heart disease with heart failure: Secondary | ICD-10-CM | POA: Insufficient documentation

## 2022-04-22 DIAGNOSIS — Z86711 Personal history of pulmonary embolism: Secondary | ICD-10-CM | POA: Insufficient documentation

## 2022-04-22 DIAGNOSIS — C3412 Malignant neoplasm of upper lobe, left bronchus or lung: Secondary | ICD-10-CM | POA: Insufficient documentation

## 2022-04-22 DIAGNOSIS — Z7984 Long term (current) use of oral hypoglycemic drugs: Secondary | ICD-10-CM | POA: Insufficient documentation

## 2022-04-22 DIAGNOSIS — J449 Chronic obstructive pulmonary disease, unspecified: Secondary | ICD-10-CM | POA: Insufficient documentation

## 2022-04-22 DIAGNOSIS — L03211 Cellulitis of face: Secondary | ICD-10-CM | POA: Diagnosis not present

## 2022-04-22 DIAGNOSIS — Z79899 Other long term (current) drug therapy: Secondary | ICD-10-CM | POA: Insufficient documentation

## 2022-04-22 DIAGNOSIS — E039 Hypothyroidism, unspecified: Secondary | ICD-10-CM | POA: Insufficient documentation

## 2022-04-26 ENCOUNTER — Encounter: Payer: Self-pay | Admitting: Nurse Practitioner

## 2022-04-26 ENCOUNTER — Inpatient Hospital Stay (HOSPITAL_BASED_OUTPATIENT_CLINIC_OR_DEPARTMENT_OTHER): Payer: 59 | Admitting: Nurse Practitioner

## 2022-04-26 VITALS — BP 129/71 | HR 67 | Temp 97.5°F

## 2022-04-26 DIAGNOSIS — Z86711 Personal history of pulmonary embolism: Secondary | ICD-10-CM | POA: Diagnosis not present

## 2022-04-26 DIAGNOSIS — E039 Hypothyroidism, unspecified: Secondary | ICD-10-CM | POA: Diagnosis not present

## 2022-04-26 DIAGNOSIS — L03211 Cellulitis of face: Secondary | ICD-10-CM | POA: Diagnosis not present

## 2022-04-26 DIAGNOSIS — D500 Iron deficiency anemia secondary to blood loss (chronic): Secondary | ICD-10-CM | POA: Diagnosis not present

## 2022-04-26 DIAGNOSIS — J9611 Chronic respiratory failure with hypoxia: Secondary | ICD-10-CM | POA: Diagnosis not present

## 2022-04-26 DIAGNOSIS — E119 Type 2 diabetes mellitus without complications: Secondary | ICD-10-CM | POA: Diagnosis not present

## 2022-04-26 DIAGNOSIS — J449 Chronic obstructive pulmonary disease, unspecified: Secondary | ICD-10-CM | POA: Diagnosis not present

## 2022-04-26 DIAGNOSIS — Z7984 Long term (current) use of oral hypoglycemic drugs: Secondary | ICD-10-CM | POA: Diagnosis not present

## 2022-04-26 DIAGNOSIS — Z79899 Other long term (current) drug therapy: Secondary | ICD-10-CM | POA: Diagnosis not present

## 2022-04-26 DIAGNOSIS — Z803 Family history of malignant neoplasm of breast: Secondary | ICD-10-CM | POA: Diagnosis not present

## 2022-04-26 DIAGNOSIS — I10 Essential (primary) hypertension: Secondary | ICD-10-CM | POA: Diagnosis not present

## 2022-04-26 DIAGNOSIS — J9610 Chronic respiratory failure, unspecified whether with hypoxia or hypercapnia: Secondary | ICD-10-CM

## 2022-04-26 DIAGNOSIS — C3412 Malignant neoplasm of upper lobe, left bronchus or lung: Secondary | ICD-10-CM | POA: Diagnosis not present

## 2022-04-26 DIAGNOSIS — I2782 Chronic pulmonary embolism: Secondary | ICD-10-CM | POA: Diagnosis not present

## 2022-04-26 DIAGNOSIS — M549 Dorsalgia, unspecified: Secondary | ICD-10-CM | POA: Diagnosis not present

## 2022-04-26 DIAGNOSIS — Z801 Family history of malignant neoplasm of trachea, bronchus and lung: Secondary | ICD-10-CM | POA: Diagnosis not present

## 2022-04-26 DIAGNOSIS — Z7189 Other specified counseling: Secondary | ICD-10-CM | POA: Diagnosis not present

## 2022-04-26 DIAGNOSIS — I110 Hypertensive heart disease with heart failure: Secondary | ICD-10-CM | POA: Diagnosis not present

## 2022-04-26 DIAGNOSIS — Z08 Encounter for follow-up examination after completed treatment for malignant neoplasm: Secondary | ICD-10-CM

## 2022-04-26 DIAGNOSIS — Z800 Family history of malignant neoplasm of digestive organs: Secondary | ICD-10-CM | POA: Diagnosis not present

## 2022-04-26 DIAGNOSIS — I5030 Unspecified diastolic (congestive) heart failure: Secondary | ICD-10-CM | POA: Diagnosis not present

## 2022-04-26 DIAGNOSIS — Z7901 Long term (current) use of anticoagulants: Secondary | ICD-10-CM | POA: Diagnosis not present

## 2022-04-26 DIAGNOSIS — Z87891 Personal history of nicotine dependence: Secondary | ICD-10-CM | POA: Diagnosis not present

## 2022-04-26 DIAGNOSIS — Z9981 Dependence on supplemental oxygen: Secondary | ICD-10-CM | POA: Diagnosis not present

## 2022-04-26 NOTE — Progress Notes (Addendum)
Hematology/Oncology Consult Note [facility]  [phone] [phone]  Patient Care Team: Sallyanne Kuster, NP as PCP - General (Nurse Practitioner) Glory Buff, RN as Registered Nurse Monika Salk, [facility] as Pharmacist (Pharmacist)   Name of the patient: Lynn Robbins  [id number]  1940/03/29   Date of visit: 04/26/22  Diagnosis- clinical stage I lung cancer left upper lobe cT2 N0 M0  Chief complaint/ Reason for visit-routine follow-up of lung cancer  Heme/Onc history: patient presented as a 82 year old female with a past medical history significant for COPD for which she is on continuous oxygen 2 L, hypertension, CHF who presented to the ER on 04/10/2018 with some symptoms of chest wall pain which led to CT chest and CT abdomen.  CT scan showed left upper lobe spiculated mass highly concerning for primary lung cancer.  There was also a second nodule in the right upper lobe which may represent synchronous tumor.  No significant mediastinal adenopathy.  This was followed by a PET CT scan on 04/14/2018 which showed hypermetabolic left upper lobe lung mass 3.5 x 2.3 cm with an SUV of 13.5.  Second nodule in the right upper lobe measuring 14 mm which did not show any hypermetabolism.  No hypermetabolic mediastinal lymph nodes.  Patient received SBRT to that the lesion  Interval history- Patient is 82 year old female who presents to clinic for hospital follow up. Patient has history of diastolic CHF, T2DM, HOH, hptn, depression, anxiety, copd on home o2, hypothyroidism, lung cancer, who presented to the with pain and swelling of the left face x 3-4 days. Low grade temp. CT suggestive of cellulitis and lymphadenitis. She received IV antibiotics. There was concern of metastatic disease and CT showed increased LUL mass invading rib, chronic thromboembolism changes, chronic pancreatitis, right nephrolithiasis w/o obstruction or hydronephrosis. Eliquis was  started. She was discharged on augmentin. Home health ordered but patient's family wished for LTC facility. She has moved to nursing facility to help receive care. She has not received hearing aids. Shortness of breath is chronic. No fevers. Swelling and pain of jaw has improved.   ECOG PS- 2 Pain scale- 3  Review of systems- Review of Systems  Constitutional:  Positive for malaise/fatigue. Negative for chills, fever and weight loss.  HENT:  Positive for hearing loss. Negative for congestion, ear discharge, nosebleeds, sinus pain and sore throat.   Eyes:  Negative for blurred vision.  Respiratory:  Positive for shortness of breath. Negative for cough, hemoptysis, sputum production and wheezing.   Cardiovascular:  Negative for chest pain, palpitations, orthopnea and claudication.  Gastrointestinal:  Negative for abdominal pain, blood in stool, constipation, diarrhea, heartburn, melena, nausea and vomiting.  Genitourinary:  Negative for dysuria, flank pain, frequency, hematuria and urgency.  Musculoskeletal:  Negative for back pain, joint pain and myalgias.  Skin:  Negative for rash.  Neurological:  Negative for dizziness, tingling, focal weakness, seizures, weakness and headaches.  Endo/Heme/Allergies:  Does not bruise/bleed easily.  Psychiatric/Behavioral:  Positive for memory loss. Negative for depression and suicidal ideas. The patient does not have insomnia.     No Known Allergies  Past Medical History:  Diagnosis Date   Cancer (HCC)    CHF (congestive heart failure) (HCC)    COPD (chronic obstructive pulmonary disease) (HCC)    Depression    Diabetes mellitus without complication (HCC)    HOH (hard of hearing)    Hypertension    Past Surgical History:  Procedure Laterality Date   CESAREAN  SECTION     Social History   Socioeconomic History   Marital status: Single    Spouse name: Not on file   Number of children: 5   Years of education: Not on file   Highest education  level: Not on file  Occupational History   Not on file  Tobacco Use   Smoking status: Former    Packs/day: 3.00    Years: 57.00    Total pack years: 171.00    Types: Cigarettes    Quit date: 04/17/1998    Years since quitting: 24.0   Smokeless tobacco: Former    Types: Snuff  Vaping Use   Vaping Use: Never used  Substance and Sexual Activity   Alcohol use: No   Drug use: No   Sexual activity: Never  Other Topics Concern   Not on file  Social History Narrative   ** Merged History Encounter **       Social Determinants of Health   Financial Resource Strain: Low Risk  (10/03/2020)   Overall Financial Resource Strain (CARDIA)    Difficulty of Paying Living Expenses: Not very hard  Food Insecurity: No Food Insecurity (04/09/2022)   Hunger Vital Sign    Worried About Running Out of Food in the Last Year: Never true    Ran Out of Food in the Last Year: Never true  Transportation Needs: No Transportation Needs (04/09/2022)   PRAPARE - Administrator, Civil Service (Medical): No    Lack of Transportation (Non-Medical): No  Physical Activity: Not on file  Stress: Not on file  Social Connections: Not on file  Intimate Partner Violence: Not At Risk (04/09/2022)   Humiliation, Afraid, Rape, and Kick questionnaire    Fear of Current or Ex-Partner: No    Emotionally Abused: No    Physically Abused: No    Sexually Abused: No    Family History  Problem Relation Age of Onset   Colon cancer Mother    Aneurysm Mother 23       brain   Diabetes Father    Colon cancer Father    Lung cancer Sister    Breast cancer Sister    Lung cancer Sister    Cancer Sister        type unknown   Lung cancer Brother    Bone cancer Brother    Lung cancer Brother    Early death Daughter    Heart disease Daughter     Current Outpatient Medications:    Accu-Chek FastClix Lancets MISC, Use as directed to check sugars once daily. DX E11.65, Disp: 306 each, Rfl: 3   albuterol (VENTOLIN  HFA) 108 (90 Base) MCG/ACT inhaler, Inhale 2 puffs into the lungs every 6 (six) hours as needed for wheezing or shortness of breath., Disp: 54 g, Rfl: 3   apixaban (ELIQUIS) 5 MG TABS tablet, Take 1 tablet (5 mg total) by mouth 2 (two) times daily., Disp: 60 tablet, Rfl: 2   atenolol (TENORMIN) 25 MG tablet, Take 1 tablet (25 mg total) by mouth daily., Disp: 90 tablet, Rfl: 3   clonazePAM (KLONOPIN) 1 MG tablet, Take 1 tablet (1 mg total) by mouth 2 (two) times daily as needed. for anxiety, Disp: 60 tablet, Rfl: 2   GI Cocktail (alum & mag hydroxide, lidocaine, dicyclomine) oral mixture, Take 10 mLs by mouth 2 (two) times daily as needed (abdominal pain)., Disp: 550 mL, Rfl: 5   glimepiride (AMARYL) 1 MG tablet, TAKE 1 TABLET BY  MOUTH DAILY WITH BREAKFAST, Disp: 90 tablet, Rfl: 3   glucose blood (ACCU-CHEK GUIDE) test strip, Use as instructed to check blood sugars once daily DX: E11.65, Disp: 100 each, Rfl: 12   HYDROcodone-acetaminophen (NORCO) 7.5-325 MG tablet, TAKE ONE TABLET BY MOUTH TWICE DAILY AS NEEDED FOR MODERATE OR SEVERE PAIN, Disp: 30 tablet, Rfl: 0   losartan-hydrochlorothiazide (HYZAAR) 50-12.5 MG tablet, TAKE 1 TABLET BY MOUTH ONCE DAILY, Disp: 90 tablet, Rfl: 3   ondansetron (ZOFRAN-ODT) 4 MG disintegrating tablet, Take 1 tablet (4 mg total) by mouth every 8 (eight) hours as needed for nausea or vomiting., Disp: 90 tablet, Rfl: 2   OXYGEN, Inhale into the lungs., Disp: , Rfl:    pantoprazole (PROTONIX) 40 MG tablet, Take 1 tablet (40 mg total) by mouth daily., Disp: 90 tablet, Rfl: 3   SitaGLIPtin-MetFORMIN HCl (JANUMET XR) 50-500 MG TB24, TAKE ONE TABLET EVERY DAY WITH FOOD FOR DIABETES, Disp: 90 tablet, Rfl: 3   tiotropium (SPIRIVA HANDIHALER) 18 MCG inhalation capsule, INHALE 1 CAPSULE VIA HANDIHALER ONCE DAILY AT THE SAME TIME EVERY DAY, Disp: 90 capsule, Rfl: 3  Physical exam:  Vitals:   04/26/22 1442  BP: 129/71  Pulse: 67  Temp: (!) 97.5 F (36.4 C)  SpO2: 100%    Physical Exam Constitutional:      General: She is not in acute distress.    Comments: Sitting in a wheelchair  Cardiovascular:     Rate and Rhythm: Normal rate and regular rhythm.     Heart sounds: Normal heart sounds.  Pulmonary:     Comments: Effort increased.  Breath sounds decreased over the lung bases.  She is on oxygen. Abdominal:     General: Bowel sounds are normal.     Palpations: Abdomen is soft.  Skin:    General: Skin is warm and dry.  Neurological:     Mental Status: She is alert and oriented to person, place, and time.        Latest Ref Rng & Units 04/10/2022    4:58 AM  CMP  Glucose 70 - 99 mg/dL 086   BUN 8 - 23 mg/dL 20   Creatinine 5.78 - 1.00 mg/dL 4.69   Sodium 629 - 528 mmol/L 141   Potassium 3.5 - 5.1 mmol/L 4.8   Chloride 98 - 111 mmol/L 100   CO2 22 - 32 mmol/L 34   Calcium 8.9 - 10.3 mg/dL 8.8   Total Protein 6.5 - 8.1 g/dL 5.6   Total Bilirubin 0.3 - 1.2 mg/dL 0.4   Alkaline Phos 38 - 126 U/L 75   AST 15 - 41 U/L 13   ALT 0 - 44 U/L 11       Latest Ref Rng & Units 04/10/2022    4:58 AM  CBC  WBC 4.0 - 10.5 K/uL 7.5   Hemoglobin 12.0 - 15.0 g/dL 8.7   Hematocrit 41.3 - 46.0 % 30.3   Platelets 150 - 400 K/uL 192    No images are attached to the encounter.  CT CHEST ABDOMEN PELVIS W CONTRAST  Addendum Date: 04/23/2022   ADDENDUM REPORT: 04/23/2022 19:00 ADDENDUM: Addendum to update impression after comparison with CT chest without contrast 05/20/2021. No significant change in the size of the left upper lobe subpleural mass with surrounding increased interstitial markings suggesting postradiation change. Underlying neoplastic process is difficult to exclude. Continued follow-up is recommended. These results will be called to the ordering clinician or representative by the Radiologist Assistant, and communication documented  in the PACS or [employer]. Electronically Signed   By: Minerva Fester M.D.   On: 04/23/2022 19:00   Result Date:  04/23/2022 CLINICAL DATA:  Metastatic disease evaluation in a patient with history of lung cancer. EXAM: CT CHEST, ABDOMEN, AND PELVIS WITH CONTRAST TECHNIQUE: Multidetector CT imaging of the chest, abdomen and pelvis was performed following the standard protocol during bolus administration of intravenous contrast. RADIATION DOSE REDUCTION: This exam was performed according to the departmental dose-optimization program which includes automated exposure control, adjustment of the mA and/or kV according to patient size and/or use of iterative reconstruction technique. CONTRAST:  OMNIPAQUE IOHEXOL 300 MG/ML  SOLN COMPARISON:  CT chest 05/20/2021; MR abdomen 04/28/2020 and CT abdomen and pelvis 11/13/2019 FINDINGS: CT CHEST FINDINGS Cardiovascular: Eccentric nonocclusive filling defect at the distal right main pulmonary artery extending into the right upper lobe and right lower lobe pulmonary arteries compatible with pulmonary embolism. The eccentric nature suggests this may be a chronic thromboembolism. Main pulmonary artery measuring 33 mm which can be seen with pulmonary arterial hypertension. Aortic valve calcification. Normal heart size. Aortic atherosclerotic calcification. No pericardial effusion. Mediastinum/Nodes: No mediastinal adenopathy. Unremarkable esophagus. Strandy secretions in the lower trachea. Partially visualized left submental adenopathy. See separate report from CT maxillofacial 04/09/2022 for details. Lungs/Pleura: Centrilobular emphysema. Increased size of the left upper lobe subpleural consolidative mass now measuring 8.1 x 3.7 cm in the axial plane, previously 3.1 by 1.9 cm on 11/13/2019. Increased pleural thickening in this region. Adjacent interlobular septal thickening and mild ground-glass opacities. No pleural effusion or pneumothorax. No change in 12 mm irregular nodule in the right upper lobe (4/53) or of the 4 mm nodule in the right lower lobe (4/76). Musculoskeletal: Pathologic  fractures and bony destruction of the lateral left third and fourth ribs secondary to invasion from the adjacent lung mass. CT ABDOMEN PELVIS FINDINGS Hepatobiliary: No suspicious focal hepatic lesion. Tiny cyst along the gallbladder fossa is unchanged from MRI 04/28/2020. Unremarkable gallbladder and biliary tree. Pancreas: Severe pancreatic atrophy with multicystic dilation of the main pancreatic duct and multiple dilated side ducts is grossly similar to MRI 04/28/2020. Calcifications within the pancreatic parenchyma consistent with sequela of chronic pancreatitis. Spleen: Unremarkable. Adrenals/Urinary Tract: No new or enlarging adrenal lesion. Bilateral renal cysts are stable from 04/28/2020 and no additional follow-up is recommended. Bulky calcification in the right renal pelvis measuring 15 mm in maximum dimension. Additional nonobstructing right renal calculi. No hydronephrosis or hydroureter. Cortical renal scarring left kidney. Unremarkable bladder. Stomach/Bowel: Normal caliber large and small bowel. Colonic diverticulosis without diverticulitis. Moderate colonic stool load. No bowel wall thickening. Unremarkable stomach. Enteric contrast is present within the distal small bowel. Normal appendix. Vascular/Lymphatic: Aortic atherosclerotic calcification. No aneurysm. No adenopathy in the abdomen or pelvis. Reproductive: Uterus and bilateral adnexa are unremarkable. Other: No free intraperitoneal fluid or air. Musculoskeletal: No acute fracture or aggressive osseous lesion. Advanced thoracolumbar spondylosis. Ankylosis of L4-S1. Benign hemangioma T7. IMPRESSION: 1. Increased size of the left upper lobe subpleural mass now measuring up to 8.1 cm, previously 3.1 cm on 11/13/2019. 2. Pathologic fractures and bony destruction of the lateral left third and fourth ribs secondary to invasion from the adjacent lung mass. 3. Eccentric nonocclusive filling defect at the distal right main pulmonary artery extending  into the right upper lobe and right lower lobe pulmonary arteries compatible with pulmonary embolism. The eccentric nature suggests this may be a chronic thromboembolism. 4. No evidence of metastatic disease in the abdomen or pelvis.  5. Severe pancreatic atrophy with multicystic dilation of the main pancreatic duct and multiple dilated side ducts is grossly similar to MRI 04/28/2020. 6. Right nephrolithiasis measuring up to 15 mm. No hydronephrosis or obstructing calculi. Aortic Atherosclerosis (ICD10-I70.0) and Emphysema (ICD10-J43.9). These results were called by telephone at the time of interpretation on 04/11/2022 at 9:39 pm to provider Sindy Guadeloupe, who verbally acknowledged these results. Electronically Signed: By: Minerva Fester M.D. On: 04/11/2022 22:09   CT Maxillofacial W Contrast  Result Date: 04/09/2022 CLINICAL DATA:  Sublingual/submandibular abscess EXAM: CT MAXILLOFACIAL WITH CONTRAST TECHNIQUE: Multidetector CT imaging of the maxillofacial structures was performed with intravenous contrast. Multiplanar CT image reconstructions were also generated. RADIATION DOSE REDUCTION: This exam was performed according to the departmental dose-optimization program which includes automated exposure control, adjustment of the mA and/or kV according to patient size and/or use of iterative reconstruction technique. CONTRAST:  70mL OMNIPAQUE IOHEXOL 300 MG/ML  SOLN COMPARISON:  None Available. FINDINGS: Osseous: No fracture or mandibular dislocation. No destructive process. Orbits: Negative. No traumatic or inflammatory finding. Sinuses: Paranasal sinus mucosal thickening. Soft tissues: Edema and enlarged lymph nodes in the left submandibular space. The adjacent submandibular gland is unremarkable. No evidence of ductal dilation or calculus. No discrete, drainable fluid collection. Inflamed and thickened left buccal mucosa along the mandible, possibly infectious. Limited intracranial: No significant or unexpected  finding. IMPRESSION: 1. Edema and enlarged lymph nodes in the left submandibular space, nonspecific but suggestive of cellulitis and lymphadenitis. The adjacent submandibular gland is unremarkable. No evidence of ductal dilation or calculus. No discrete, drainable fluid collection. 2. Inflamed and thickened left buccal mucosa along the mandible, probably infectious given the above findings. Correlate with direct inspection to exclude malignancy. Electronically Signed   By: Feliberto Harts M.D.   On: 04/09/2022 16:39      Assessment and plan- Patient is a 82 y.o. female with   History of stage I Left Upper Lobe Lung Cancer- initially deemed too high risk for biopsy, received SBRT 03/2018. Hypermetabolic on pet. Second nodule in RUL 1.4 cm, not hypermetabolic. On recent hospitalization, CT performed. LUL mass initially felt to have increased from 3.5 cm --> 8.1 cm. No change in RUL mass. Pathologic fractures of left 3rd and 4th ribs d/t invasion by mass. No adenopathy. Initially biopsy vs chemo-radiation vs empiric radiation was discussed and patient was not felt to be a candidate for biopsy. However, on re-review, it was felt that this reflected post radiation changes and pathologic fractures d/t radiation. Overall masses were felt to be stable and continued surveillance was recommended. We discussed if she would want to continue to see Dr Smith Robert for surveillance or follow up as needed given her progressive other comorbidities. She elects for surveillance.  Chronic vte - ct shows nonocclusive filling defect in the distal right main PA compatible with PE; chronic. On eliquis. Reviewed continuation vs discontinuation. Currently no bleeding issues or falls so reasonable to continue. Would re-evaluate risks vs benefits routinely.  COPD w/ chronic respiratory failure- symptoms are stable. She continues home oxygen. Followed by pulmonology, Dr. Park Breed. We reviewed that she would not be a candidate for hospice from a  malignancy standpoint given stability of her disease but she could discuss with Dr. Park Breed to see if she would qualify from copd standpoint.  Back pain- chronic. Unrelated to malignancy. Managed by PCP's office. Recommended she follow up with them.  Cellulitis of jaw- s/p hospitalization, IV antibiotics, now on oral antibiotics. Symptoms improving. Recommend completion of  antibiotics as prescribed.  Hard of hearing- in process of getting hearing aids.  Chronic pancreatitis vs IPMN- not a candidate for whipple. Declines eus or additional workup.   Goals of care- patient now residing at nursing facility; [facility]. Patient remains full code but expresses desire to focus on comfort based care. She has been interested in hospice for some time which we again discussed would be driven by her copd or other health conditions as opposed to lung cancer. She is followed by outpatient palliative care. Recommend ongoing discussions with palliative care. Encouraged patient to revisit code status.   Disposition:  3 mo- video visit with Dr. Smith Robert. Recommended that her daughter Sedalia Muta) join for visit.   Visit Diagnosis 1. Malignant neoplasm of upper lobe of left lung (HCC)   2. Other chronic pulmonary embolism without acute cor pulmonale (HCC)   3. Cellulitis of jaw   4. Goals of care, counseling/discussion   5. Chronic respiratory failure, unspecified whether with hypoxia or hypercapnia (HCC)    Consuello Masse, DNP, AGNP-C, AOCNP [facility] [phone] (clinic) 04/26/2022

## 2022-05-05 ENCOUNTER — Encounter: Payer: Self-pay | Admitting: Family Medicine

## 2022-05-05 ENCOUNTER — Non-Acute Institutional Stay: Payer: 59 | Admitting: Family Medicine

## 2022-05-05 VITALS — BP 124/68 | HR 65 | Temp 98.0°F | Resp 18

## 2022-05-05 DIAGNOSIS — L249 Irritant contact dermatitis, unspecified cause: Secondary | ICD-10-CM | POA: Insufficient documentation

## 2022-05-05 DIAGNOSIS — B372 Candidiasis of skin and nail: Secondary | ICD-10-CM | POA: Insufficient documentation

## 2022-05-05 DIAGNOSIS — L22 Diaper dermatitis: Secondary | ICD-10-CM | POA: Diagnosis not present

## 2022-05-05 DIAGNOSIS — R4189 Other symptoms and signs involving cognitive functions and awareness: Secondary | ICD-10-CM

## 2022-05-05 DIAGNOSIS — L03211 Cellulitis of face: Secondary | ICD-10-CM

## 2022-05-05 DIAGNOSIS — J9610 Chronic respiratory failure, unspecified whether with hypoxia or hypercapnia: Secondary | ICD-10-CM | POA: Diagnosis not present

## 2022-05-05 DIAGNOSIS — R918 Other nonspecific abnormal finding of lung field: Secondary | ICD-10-CM

## 2022-05-05 NOTE — Progress Notes (Signed)
[facility] Consult Note Telephone: [phone]  Fax: [phone]   Date of encounter: 05/05/22 9:47 AM PATIENT NAME: Lynn Robbins [address] [place] [address]   [phone] (home)  DOB: 08-29-1940 MRN: [id number] PRIMARY CARE PROVIDER:    Jonetta Osgood, NP,  [place] [address] [phone]  REFERRING PROVIDER:   Jonetta Osgood, NP [place],  [place] [address] [place] Agent/Health Care Power of Attorney:    Contact Information     Name Relation Home Work Lynn Robbins Daughter   [phone]   Lynn Robbins Daughter [phone]     Lynn Robbins   [phone]        I met face to face with patient in [facility] and [place]. Palliative Care was asked to follow this patient by consultation request of Lynn Osgood, NP to address advance care planning and complex medical decision making. This is an initial visit.  ADVANCE CARE PLANNING/GOALS OF CARE:  BARRIERS: Per review of chart notes from inpatient stay pt's daughter was seeking out Palliative Care and may be considering Hospice but wanted pt to remain a full code. Expressed to inpatient physician reportedly that they did not understand her clinical picture and whether or not cancer had metastasized  Lynn Robbins PHONE NUMBER: Lynn Robbins [phone]   Has an advance directive document-living will and [facility] POA.  HC POA is completed but there are no initials to indicate pt wishes on living will.  CODE STATUS: Full Code    ASSESSMENT AND / RECOMMENDATIONS:  PPS: 40% Cellulitis of Jaw Resolved, antibiotic course completed. Follow up with Oncology to evaluate buccal thickening if possible metastatic disease with lymphadenopathy. Pt currently dipping snuff, particularly on left side of mouth.  LUL mass Primary bronchogenic cancer s/p resection and  chemotherapy, now recurrent with metastatic disease to ribs on left, possible involvement of pancreas vs chronic pancreatitis Keep follow up with Oncology  Chronic respiratory failure, unspecified if hypoxia/hypercapnea Stable on 3L O2, may need titration to keep above 92%. Continue current inhaler maintenance and rescue regimen  Candidal diaper dermatitis Apply Nystatin ointment to reddened areas of perineum, groin and buttocks BID x 14 days Apply thin film Triamcinolone 0.1% cream to perineum, groin and reddened areas of buttocks BID x 14 days Use barrier protective cream/Vaseline between applications of Nystatin and triamcinolone.  5.   Irritant contact dermatitis Apply thin film of 01% Triamcinolone cream to left posterior neck and shoulder rash x 14 days.  6.   Cognitive deficits Provide explanation in simple terms and 1 step process at a time.  Follow up Palliative Care Visit:  Palliative Care continuing to follow up by monitoring for changes in appetite, weight, functional and cognitive status for chronic disease progression and management in agreement with patient's stated goals of care. Next visit in 2-3 weeks or prn.  This visit was coded based on medical decision making (MDM).  Chief Complaint  Palliative Care received a referral to follow up with patient for chronic medical management after hospitalization for facial cellulitis in setting of lung cancer.  Palliative Care is also following to assist with advance care planning and goals of care. Pt c/o perineal and groin tenderness and midline midback pain.    HISTORY OF PRESENT ILLNESS: Lynn Robbins is a 82 y.o. 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  She was recently hospitalized for cellulitis with submandibular lymphadenopathy treated with IV Cefepime and Vancomycin initially, changed to IV Unasyn and sent to facility to complete 4 days  oral Augmentin.  She is notably incontinent and was saturated. She c/o back pain but had recent pain pill which was helping.  She is only able to stand pivot for short time and with poor balance.  Patient short term recall and understanding notably impaired as required explanation of services multiple times. Very HOH and states that she has hearing aides coming.    ACTIVITIES OF DAILY LIVING: CONTINENT OF BLADDER? N0 CONTINENT OF BOWEL? No BATHING/DRESSING/FEEDING?  Independent with feeding.  Assist with bathing/dressing  MOBILITY:   AMBULATORY WITH ASSISTIVE DEVICE: Yes WHEELCHAIR Bound   APPETITE? Fair   CURRENT PROBLEM LIST:  Patient Active Problem List   Diagnosis Date Noted   Cellulitis of jaw 04/09/2022   Chronic obstructive pulmonary disease ([place]) 09/03/2019   Supplemental oxygen dependent 09/03/2019   Bilateral hearing loss 03/09/2019   Generalized anxiety disorder 03/09/2019   Goals of care, counseling/discussion 04/18/2018   Mass of upper lobe of left lung 04/18/2018   Intermittent asthma without complication [id number]   Chronic pain disorder 06/07/2017   Essential hypertension 06/07/2017   Mixed hyperlipidemia 02/03/2017   Hypothyroidism 02/03/2017   Type 2 diabetes mellitus with hyperglycemia ([place]) 02/03/2017   Dysuria 02/03/2017   PAST MEDICAL HISTORY:  Active Ambulatory Problems    Diagnosis Date Noted   Mixed hyperlipidemia 02/03/2017   Hypothyroidism 02/03/2017   Type 2 diabetes mellitus with hyperglycemia ([place]) 02/03/2017   Dysuria 02/03/2017   Intermittent asthma without complication [id number]   Chronic pain disorder 06/07/2017   Essential hypertension 06/07/2017   Goals of care, counseling/discussion 04/18/2018   Mass of upper lobe of left lung 04/18/2018   Bilateral hearing loss 03/09/2019   Generalized anxiety disorder 03/09/2019   Chronic obstructive pulmonary disease ([place]) 09/03/2019   Supplemental oxygen dependent 09/03/2019   Cellulitis of  jaw 04/09/2022   Resolved Ambulatory Problems    Diagnosis Date Noted   No Resolved Ambulatory Problems   Past Medical History:  Diagnosis Date   Cancer ([place])    CHF (congestive heart failure) ([facility])    COPD (chronic obstructive pulmonary disease) ([place])    Depression    Diabetes mellitus without complication ([place])    HOH (hard of hearing)    Hypertension    SOCIAL HX:  Social History   Tobacco Use   Smoking status: Former    Packs/day: 3.00    Years: 57.00    Additional pack years: 0.00    Total pack years: 171.00    Types: Cigarettes    Quit date: 04/17/1998    Years since quitting: 24.0   Smokeless tobacco: Former    Types: Snuff  Substance Use Topics   Alcohol use: No   FAMILY HX:  Family History  Problem Relation Age of Onset   Colon cancer Mother    Aneurysm Mother 81       brain   Diabetes Father    Colon cancer Father    Lung cancer Sister    Breast cancer Sister    Lung cancer Sister    Cancer Sister        type unknown   Lung cancer Brother    Bone cancer Brother    Lung cancer Brother    Early death Daughter    Heart disease Daughter  Preferred Pharmacy: ALLERGIES: No Known Allergies   PERTINENT MEDICATIONS:  Outpatient Encounter Medications as of 05/05/2022  Medication Sig   Accu-Chek FastClix Lancets MISC Use as directed to check sugars once daily. DX E11.65   albuterol (VENTOLIN HFA) 108 (90 Base) MCG/ACT inhaler Inhale 2 puffs into the lungs every 6 (six) hours as needed for wheezing or shortness of breath.   apixaban (ELIQUIS) 5 MG TABS tablet Take 1 tablet (5 mg total) by mouth 2 (two) times daily.   atenolol (TENORMIN) 25 MG tablet Take 1 tablet (25 mg total) by mouth daily.   clonazePAM (KLONOPIN) 1 MG tablet Take 1 tablet (1 mg total) by mouth 2 (two) times daily as needed. for anxiety   GI Cocktail (alum & mag hydroxide, lidocaine, dicyclomine) oral mixture Take 10 mLs by mouth 2 (two) times daily as needed (abdominal pain).    glimepiride (AMARYL) 1 MG tablet TAKE 1 TABLET BY MOUTH DAILY WITH BREAKFAST   glucose blood (ACCU-CHEK GUIDE) test strip Use as instructed to check blood sugars once daily DX: E11.65   HYDROcodone-acetaminophen (NORCO) 7.5-325 MG tablet TAKE ONE TABLET BY MOUTH TWICE DAILY AS NEEDED FOR MODERATE OR SEVERE PAIN   losartan-hydrochlorothiazide (HYZAAR) 50-12.5 MG tablet TAKE 1 TABLET BY MOUTH ONCE DAILY   ondansetron (ZOFRAN-ODT) 4 MG disintegrating tablet Take 1 tablet (4 mg total) by mouth every 8 (eight) hours as needed for nausea or vomiting.   OXYGEN Inhale into the lungs.   pantoprazole (PROTONIX) 40 MG tablet Take 1 tablet (40 mg total) by mouth daily.   SitaGLIPtin-MetFORMIN HCl (JANUMET XR) 50-500 MG TB24 TAKE ONE TABLET EVERY DAY WITH FOOD FOR DIABETES   tiotropium (SPIRIVA HANDIHALER) 18 MCG inhalation capsule INHALE 1 CAPSULE VIA HANDIHALER ONCE DAILY AT THE SAME TIME EVERY DAY   No facility-administered encounter medications on file as of 05/05/2022.    History obtained from review of EMR, discussion with primary team, and interview with family, facility staff/caregiver and/or patient.   CBC    Component Value Date/Time   WBC 7.5 04/10/2022 0458   RBC 2.86 (L) 04/10/2022 0458   HGB 8.7 (L) 04/10/2022 0458   HGB 10.7 (L) 02/18/2022 1914   HCT 30.3 (L) 04/10/2022 0458   HCT 32.7 (L) 02/18/2022 1914   PLT 192 04/10/2022 0458   PLT 299 02/18/2022 1914   MCV 105.9 (H) 04/10/2022 0458   MCV 96 02/18/2022 1914   MCV 95 01/29/2014 1101   MCH 30.4 04/10/2022 0458   MCHC 28.7 (L) 04/10/2022 0458   RDW 14.4 04/10/2022 0458   RDW 14.6 02/18/2022 1914   RDW 14.1 01/29/2014 1101   LYMPHSABS 1.0 02/18/2022 1914   MONOABS 0.5 11/15/2019 1143   EOSABS 0.1 02/18/2022 1914   BASOSABS 0.0 02/18/2022 1914    CMP    Latest Ref Rng & Units 04/10/2022    4:58 AM 04/09/2022    1:49 PM 02/18/2022    7:14 PM  CMP  Glucose 70 - 99 mg/dL 111  136  215   BUN 8 - 23 mg/dL 20  24  15     Creatinine 0.44 - 1.00 mg/dL 0.67  1.23  0.65   Sodium 135 - 145 mmol/L 141  145  143   Potassium 3.5 - 5.1 mmol/L 4.8  5.1  5.7   Chloride 98 - 111 mmol/L 100  102  100   CO2 22 - 32 mmol/L 34  36  34   Calcium 8.9 - 10.3 mg/dL 8.8  9.4  9.1   Total Protein 6.5 - 8.1 g/dL 5.6  6.9  6.2   Total Bilirubin 0.3 - 1.2 mg/dL 0.4  0.3  <0.2   Alkaline Phos 38 - 126 U/L 75  96  101   AST 15 - 41 U/L 13  15  24    ALT 0 - 44 U/L 11  13  13      LFTs    Latest Ref Rng & Units 04/10/2022    4:58 AM 04/09/2022    1:49 PM 02/18/2022    7:14 PM  Hepatic Function  Total Protein 6.5 - 8.1 g/dL 5.6  6.9  6.2   Albumin 3.5 - 5.0 g/dL 2.7  3.2  3.5   AST 15 - 41 U/L 13  15  24    ALT 0 - 44 U/L 11  13  13    Alk Phosphatase 38 - 126 U/L 75  96  101   Total Bilirubin 0.3 - 1.2 mg/dL 0.4  0.3  <0.2     Urinalysis    Component Value Date/Time   COLORURINE YELLOW (A) 04/10/2018 1052   APPEARANCEUR Turbid (A) 01/19/2021 1140   LABSPEC 1.020 04/10/2018 1052   PHURINE 5.0 04/10/2018 1052   GLUCOSEU Negative 01/19/2021 1140   HGBUR NEGATIVE 04/10/2018 1052   BILIRUBINUR Negative 01/19/2021 1140   KETONESUR NEGATIVE 04/10/2018 1052   PROTEINUR Trace 01/19/2021 1140   PROTEINUR NEGATIVE 04/10/2018 1052   NITRITE Negative 01/19/2021 1140   NITRITE NEGATIVE 04/10/2018 1052   LEUKOCYTESUR 1+ (A) 01/19/2021 1140   LEUKOCYTESUR NEGATIVE 04/10/2018 1052   04/11/22 CT Chest abd Pelvis with contrast: Cardiovascular: Eccentric nonocclusive filling defect at the distal right main pulmonary artery extending into the right upper lobe and right lower lobe pulmonary arteries compatible with pulmonary embolism. The eccentric nature suggests this may be a chronic thromboembolism. Main pulmonary artery measuring 33 mm which can be seen with pulmonary arterial hypertension.   Aortic valve calcification. Normal heart size. Aortic atherosclerotic calcification. No pericardial effusion.   Mediastinum/Nodes: No  mediastinal adenopathy. Unremarkable esophagus. Strandy secretions in the lower trachea. Partially visualized left submental adenopathy. See separate report from CT maxillofacial 04/09/2022 for details.   Lungs/Pleura: Centrilobular emphysema. Increased size of the left upper lobe subpleural consolidative mass now measuring 8.1 x 3.7 cm in the axial plane, previously 3.1 by 1.9 cm on 11/13/2019. Increased pleural thickening in this region. Adjacent interlobular septal thickening and mild ground-glass opacities. No pleural effusion or pneumothorax. No change in 12 mm irregular nodule in the right upper lobe (4/53) or of the 4 mm nodule in the right lower lobe (4/76).   Musculoskeletal: Pathologic fractures and bony destruction of the lateral left third and fourth ribs secondary to invasion from the adjacent lung mass.   CT ABDOMEN PELVIS FINDINGS   Hepatobiliary: No suspicious focal hepatic lesion. Tiny cyst along the gallbladder fossa is unchanged from MRI 04/28/2020. Unremarkable gallbladder and biliary tree.   Pancreas: Severe pancreatic atrophy with multicystic dilation of the main pancreatic duct and multiple dilated side ducts is grossly similar to MRI 04/28/2020. Calcifications within the pancreatic parenchyma consistent with sequela of chronic pancreatitis.   Spleen: Unremarkable.   Adrenals/Urinary Tract: No new or enlarging adrenal lesion. Bilateral renal cysts are stable from 04/28/2020 and no additional follow-up is recommended. Bulky calcification in the right renal pelvis measuring 15 mm in maximum dimension. Additional nonobstructing right renal calculi. No hydronephrosis or hydroureter. Cortical renal scarring left kidney. Unremarkable bladder.  Stomach/Bowel: Normal caliber large and small bowel. Colonic diverticulosis without diverticulitis. Moderate colonic stool load. No bowel wall thickening. Unremarkable stomach. Enteric contrast is present within the  distal small bowel. Normal appendix.   Vascular/Lymphatic: Aortic atherosclerotic calcification. No aneurysm. No adenopathy in the abdomen or pelvis.   Reproductive: Uterus and bilateral adnexa are unremarkable.   Other: No free intraperitoneal fluid or air.   Musculoskeletal: No acute fracture or aggressive osseous lesion. Advanced thoracolumbar spondylosis. Ankylosis of L4-S1. Benign hemangioma T7.   IMPRESSION: 1. Increased size of the left upper lobe subpleural mass now measuring up to 8.1 cm, previously 3.1 cm on 11/13/2019. 2. Pathologic fractures and bony destruction of the lateral left third and fourth ribs secondary to invasion from the adjacent lung mass. 3. Eccentric nonocclusive filling defect at the distal right main pulmonary artery extending into the right upper lobe and right lower lobe pulmonary arteries compatible with pulmonary embolism. The eccentric nature suggests this may be a chronic thromboembolism. 4. No evidence of metastatic disease in the abdomen or pelvis. 5. Severe pancreatic atrophy with multicystic dilation of the main pancreatic duct and multiple dilated side ducts is grossly similar to MRI 04/28/2020. 6. Right nephrolithiasis measuring up to 15 mm. No hydronephrosis or obstructing calculi.   Aortic Atherosclerosis (ICD10-I70.0) and Emphysema (ICD10-J43.9).  04/09/22 CT maxillofacial FINDINGS: Osseous: No fracture or mandibular dislocation. No destructive process.   Orbits: Negative. No traumatic or inflammatory finding.   Sinuses: Paranasal sinus mucosal thickening.   Soft tissues: Edema and enlarged lymph nodes in the left submandibular space. The adjacent submandibular gland is unremarkable. No evidence of ductal dilation or calculus. No discrete, drainable fluid collection. Inflamed and thickened left buccal mucosa along the mandible, possibly infectious.   Limited intracranial: No significant or unexpected finding.    IMPRESSION: 1. Edema and enlarged lymph nodes in the left submandibular space, nonspecific but suggestive of cellulitis and lymphadenitis. The adjacent submandibular gland is unremarkable. No evidence of ductal dilation or calculus. No discrete, drainable fluid collection. 2. Inflamed and thickened left buccal mucosa along the mandible, probably infectious given the above findings. Correlate with direct inspection to exclude malignancy.      I reviewed available labs, medications, imaging, studies and related documents from the EMR. Records reviewed and summarized above.   Physical Exam: GENERAL: NAD HEENT: Edentulous, No redness/streaking at left jawline, no notable edema but difficulty observing due to large amount of tobacco in patients mouth  LUNGS: Diminished to Bilateral bases, no increased work of breathing, O2 @ 3LPM [place]  CARDIAC:  S1S2, RRR with no MRG, N edema, N cyanosis ABD:  Hypo-active BS x 4 quads, soft, non-tender EXTREMITIES: Decreased ROM, no deformity, weakness to BLE, N muscle atrophy/subcutaneous fat loss, unsteady gait  NEURO:  Increased weakness and mild cognitive impairment, Impaired balance  PSYCH:  non-anxious affect, A & O x 3  Thank you for the opportunity to participate in the care of Wareesha Getson. Please call our main office at [phone] if we can be of additional assistance.    Damaris Hippo FNP-C  [email or web address]  Phone:  [phone]

## 2022-05-07 DIAGNOSIS — J9611 Chronic respiratory failure with hypoxia: Secondary | ICD-10-CM | POA: Diagnosis not present

## 2022-05-07 DIAGNOSIS — J449 Chronic obstructive pulmonary disease, unspecified: Secondary | ICD-10-CM | POA: Diagnosis not present

## 2022-05-07 DIAGNOSIS — E119 Type 2 diabetes mellitus without complications: Secondary | ICD-10-CM | POA: Diagnosis not present

## 2022-05-07 DIAGNOSIS — E039 Hypothyroidism, unspecified: Secondary | ICD-10-CM | POA: Diagnosis not present

## 2022-05-07 DIAGNOSIS — I10 Essential (primary) hypertension: Secondary | ICD-10-CM | POA: Diagnosis not present

## 2022-05-07 DIAGNOSIS — D500 Iron deficiency anemia secondary to blood loss (chronic): Secondary | ICD-10-CM | POA: Diagnosis not present

## 2022-05-07 DIAGNOSIS — L03211 Cellulitis of face: Secondary | ICD-10-CM | POA: Diagnosis not present

## 2022-05-10 DIAGNOSIS — I10 Essential (primary) hypertension: Secondary | ICD-10-CM | POA: Diagnosis not present

## 2022-05-10 DIAGNOSIS — J449 Chronic obstructive pulmonary disease, unspecified: Secondary | ICD-10-CM | POA: Diagnosis not present

## 2022-05-10 DIAGNOSIS — E1165 Type 2 diabetes mellitus with hyperglycemia: Secondary | ICD-10-CM | POA: Diagnosis not present

## 2022-05-14 DIAGNOSIS — E119 Type 2 diabetes mellitus without complications: Secondary | ICD-10-CM | POA: Diagnosis not present

## 2022-05-21 DIAGNOSIS — E1165 Type 2 diabetes mellitus with hyperglycemia: Secondary | ICD-10-CM | POA: Diagnosis not present

## 2022-05-21 DIAGNOSIS — I10 Essential (primary) hypertension: Secondary | ICD-10-CM | POA: Diagnosis not present

## 2022-06-01 DIAGNOSIS — G8929 Other chronic pain: Secondary | ICD-10-CM | POA: Diagnosis not present

## 2022-06-01 DIAGNOSIS — E119 Type 2 diabetes mellitus without complications: Secondary | ICD-10-CM | POA: Diagnosis not present

## 2022-06-01 DIAGNOSIS — Z79899 Other long term (current) drug therapy: Secondary | ICD-10-CM | POA: Diagnosis not present

## 2022-06-01 DIAGNOSIS — I10 Essential (primary) hypertension: Secondary | ICD-10-CM | POA: Diagnosis not present

## 2022-06-01 DIAGNOSIS — K219 Gastro-esophageal reflux disease without esophagitis: Secondary | ICD-10-CM | POA: Diagnosis not present

## 2022-06-03 ENCOUNTER — Non-Acute Institutional Stay: Payer: 59 | Admitting: Family Medicine

## 2022-06-03 ENCOUNTER — Encounter: Payer: Self-pay | Admitting: Oncology

## 2022-06-03 ENCOUNTER — Telehealth: Payer: Self-pay | Admitting: Family Medicine

## 2022-06-03 ENCOUNTER — Telehealth: Payer: Self-pay | Admitting: Nurse Practitioner

## 2022-06-03 DIAGNOSIS — B372 Candidiasis of skin and nail: Secondary | ICD-10-CM

## 2022-06-03 DIAGNOSIS — C3412 Malignant neoplasm of upper lobe, left bronchus or lung: Secondary | ICD-10-CM

## 2022-06-03 DIAGNOSIS — J9610 Chronic respiratory failure, unspecified whether with hypoxia or hypercapnia: Secondary | ICD-10-CM

## 2022-06-03 DIAGNOSIS — L249 Irritant contact dermatitis, unspecified cause: Secondary | ICD-10-CM

## 2022-06-03 DIAGNOSIS — R5381 Other malaise: Secondary | ICD-10-CM

## 2022-06-03 DIAGNOSIS — L22 Diaper dermatitis: Secondary | ICD-10-CM | POA: Diagnosis not present

## 2022-06-03 DIAGNOSIS — R2689 Other abnormalities of gait and mobility: Secondary | ICD-10-CM | POA: Diagnosis not present

## 2022-06-03 NOTE — Telephone Encounter (Signed)
Called patient's daughter, Sedalia Muta re: Clinical cytogeneticist message. Diane says she is now patient's HCPOA. Patient continues to reside at [facility] facility. Is followed by outpatient palliative care. Diane continues to express that patient is not able to come to doctor's appointments or undergo workup. However, she has not elected for hospice services and remains full code. Diane is now questioning how to manage patient's pain and who would manage her pain. Patient is reporting back pain. I discussed options for appointment with Josh Borders to address goals. Daughter is interested but cannot facilitate virtual or in person appointment. I'll speak to Josh and Dr. Smith Robert to see if we can assist with care coordination and goal setting.

## 2022-06-03 NOTE — Telephone Encounter (Signed)
Receive vm that pt's daughter was wanting to follow up with nurse caring for her. Left for pt's daughter to return call. Joycelyn Man FNP-C

## 2022-06-03 NOTE — Progress Notes (Signed)
Therapist, nutritional Palliative Care Consult Note Telephone: [phone]  Fax: [phone]   Date of encounter: 06/03/22 9:41 AM PATIENT NAME: Lynn Robbins [address] [place] [address]   [phone] (home)  DOB: [birth year] MRN: [id number] PRIMARY CARE PROVIDER:    Sallyanne Kuster, NP,  [address] [place] [address] [phone]  REFERRING PROVIDER:   Sallyanne Kuster, NP [address] [place],  [place] [address] [phone]  Health Care Agent/Health Care Power of Attorney:    Contact Information     Name Relation Home Work [place] Daughter   [phone]   Wyonia Hough Daughter [phone]     Zenia Resides   [phone]        I met face to face with patient in [facility]. Palliative Care was asked to follow this patient by consultation request of Sallyanne Kuster, NP to address advance care planning and complex medical decision making. This is a follow up visit.  ADVANCE CARE PLANNING/GOALS OF CARE:  BARRIERS: Per review of chart notes from inpatient stay pt's daughter was seeking out Palliative Care and may be considering Hospice but wanted pt to remain a full code. Expressed to inpatient physician reportedly that they did not understand her clinical picture and whether or not cancer had metastasized  HEALTH CARE POA AND PHONE NUMBER: Sharman Crate [phone]   Has an advance directive document-living will and [facility] POA.  HC POA is completed but there are no initials to indicate pt wishes on living will.  I met face to face with patient in [facility] [employer]. Palliative Care was asked to follow this patient by consultation request of Sallyanne Kuster, NP to address advance care planning and complex medical decision making. This is a follow up visit.  CODE STATUS: Full Code  ASSESSMENT AND / RECOMMENDATIONS: PPS: 40%  Chronic respiratory failure:   Stable on 3L O2.                           Titrate to maintain sats at or above 92% Continue current inhaler maintenance and rescue regimen.  Staff to provide education on how to properly use inhaler.   Candidal diaper dermatitis-  Rash unresolved a this time.  Continue application of Nystatin ointment to reddened areas of perineum, groin and buttocks BID and thin film Triamcinolone 0.1% cream to perineum, groin and reddened areas of buttocks BID days.  Use barrier protective cream/Vaseline PRN between applications of Nystatin and triamcinolone.  Irritant contact dermatitis-  Resolved at this time. Report any changes.   Debility/Impaired Balance: Recommend physical therapy evaluation and treatment plan.   Follow up Palliative Care Visit:  Palliative Care continuing to follow up by monitoring for changes in appetite, weight, functional and cognitive status for chronic disease progression and management in agreement with patient's stated goals of care. Next visit in 4 weeks or prn.  This visit was coded based on medical decision making (MDM).  Chief Complaint : Follow up Palliative Care visit for medical management visit relating chronic conditions and comorbidities in setting of metastatic lung cancer and chronic respiratory failure.    HISTORY OF PRESENT ILLNESS: Lynn Robbins is a 82 y.o. 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 She c/o chronic generalized pain somewhat managed on current treatment. Decreased ability for transfers and balance reported. Patient  also states that she is scared to walk to the bathroom as her legs will give out so she defecates on herself. States that she has not had any therapy services as of yet.    ACTIVITIES OF DAILY LIVING: CONTINENT OF BLADDER/BOWEL? No BATHING/DRESSING/FEEDING?  Independent with feeding.  Assist with bathing/dressing  MOBILITY:  WHEELCHAIR  Bound APPETITE? Fair  CURRENT PROBLEM LIST:  Patient Active Problem List   Diagnosis Date Noted   Chronic respiratory failure 05/05/2022   Candidal diaper dermatitis 05/05/2022   Irritant contact dermatitis 05/05/2022   Cognitive deficits 05/05/2022   Cellulitis of jaw 04/09/2022   Chronic obstructive pulmonary disease 09/03/2019   Supplemental oxygen dependent 09/03/2019   Bilateral hearing loss 03/09/2019   Generalized anxiety disorder 03/09/2019   Goals of care, counseling/discussion 04/18/2018   Mass of upper lobe of left lung 04/18/2018   Intermittent asthma without complication 06/07/2017   Chronic pain disorder 06/07/2017   Essential hypertension 06/07/2017   Mixed hyperlipidemia 02/03/2017   Hypothyroidism 02/03/2017   Type 2 diabetes mellitus with hyperglycemia 02/03/2017   Dysuria 02/03/2017   PAST MEDICAL HISTORY:  Active Ambulatory Problems    Diagnosis Date Noted   Mixed hyperlipidemia 02/03/2017   Hypothyroidism 02/03/2017   Type 2 diabetes mellitus with hyperglycemia 02/03/2017   Dysuria 02/03/2017   Intermittent asthma without complication 06/07/2017   Chronic pain disorder 06/07/2017   Essential hypertension 06/07/2017   Goals of care, counseling/discussion 04/18/2018   Mass of upper lobe of left lung 04/18/2018   Bilateral hearing loss 03/09/2019   Generalized anxiety disorder 03/09/2019   Chronic obstructive pulmonary disease 09/03/2019   Supplemental oxygen dependent 09/03/2019   Cellulitis of jaw 04/09/2022   Chronic respiratory failure 05/05/2022   Candidal diaper dermatitis 05/05/2022   Irritant contact dermatitis 05/05/2022   Cognitive deficits 05/05/2022   Resolved Ambulatory Problems    Diagnosis Date Noted   No Resolved Ambulatory Problems   Past Medical History:  Diagnosis Date   Cancer (HCC)    CHF (congestive heart failure) (HCC)    COPD (chronic obstructive pulmonary disease) (HCC)    Depression    Diabetes mellitus without  complication (HCC)    HOH (hard of hearing)    Hypertension    SOCIAL HX:  Social History   Tobacco Use   Smoking status: Former    Packs/day: 3.00    Years: 57.00    Additional pack years: 0.00    Total pack years: 171.00    Types: Cigarettes    Quit date: 04/17/1998    Years since quitting: 24.1   Smokeless tobacco: Current    Types: Snuff  Substance Use Topics   Alcohol use: No   FAMILY HX:  Family History  Problem Relation Age of Onset   Colon cancer Mother    Aneurysm Mother 49       brain   Diabetes Father    Colon cancer Father    Lung cancer Sister    Breast cancer Sister    Lung cancer Sister    Cancer Sister        type unknown   Lung cancer Brother    Bone cancer Brother    Lung cancer Brother    Early death Daughter    Heart disease Daughter        Preferred Pharmacy: ALLERGIES: No Known Allergies   PERTINENT MEDICATIONS:  Outpatient Encounter Medications as of 06/03/2022  Medication Sig   Accu-Chek FastClix Lancets MISC Use as directed to  check sugars once daily. DX E11.65   albuterol (VENTOLIN HFA) 108 (90 Base) MCG/ACT inhaler Inhale 2 puffs into the lungs every 6 (six) hours as needed for wheezing or shortness of breath.   apixaban (ELIQUIS) 5 MG TABS tablet Take 1 tablet (5 mg total) by mouth 2 (two) times daily.   atenolol (TENORMIN) 25 MG tablet Take 1 tablet (25 mg total) by mouth daily.   clonazePAM (KLONOPIN) 1 MG tablet Take 1 tablet (1 mg total) by mouth 2 (two) times daily as needed. for anxiety   GI Cocktail (alum & mag hydroxide, lidocaine, dicyclomine) oral mixture Take 10 mLs by mouth 2 (two) times daily as needed (abdominal pain).   glimepiride (AMARYL) 1 MG tablet TAKE 1 TABLET BY MOUTH DAILY WITH BREAKFAST   glucose blood (ACCU-CHEK GUIDE) test strip Use as instructed to check blood sugars once daily DX: E11.65   HYDROcodone-acetaminophen (NORCO) 7.5-325 MG tablet TAKE ONE TABLET BY MOUTH TWICE DAILY AS NEEDED FOR MODERATE OR SEVERE  PAIN   losartan-hydrochlorothiazide (HYZAAR) 50-12.5 MG tablet TAKE 1 TABLET BY MOUTH ONCE DAILY   ondansetron (ZOFRAN-ODT) 4 MG disintegrating tablet Take 1 tablet (4 mg total) by mouth every 8 (eight) hours as needed for nausea or vomiting.   OXYGEN Inhale into the lungs.   pantoprazole (PROTONIX) 40 MG tablet Take 1 tablet (40 mg total) by mouth daily.   SitaGLIPtin-MetFORMIN HCl (JANUMET XR) 50-500 MG TB24 TAKE ONE TABLET EVERY DAY WITH FOOD FOR DIABETES   tiotropium (SPIRIVA HANDIHALER) 18 MCG inhalation capsule INHALE 1 CAPSULE VIA HANDIHALER ONCE DAILY AT THE SAME TIME EVERY DAY   No facility-administered encounter medications on file as of 06/03/2022.    History obtained from review of EMR, discussion with facility staff/caregiver and/or patient.    I reviewed available labs, medications, imaging, studies and related documents from the EMR. No new records or imaging noted.   Physical Exam: GENERAL: NAD HEENT: Edentulous, No redness/streaking at left jawline, no notable edema but difficulty observing due to large amount of tobacco in patients mouth LUNGS: Diminished to Bilateral bases, no increased work of breathing, O2 @ 3L[place] CARDIAC:  S1S2, RRR with no MRG, No edema/cyanosis ABD:  Hypo-active BS x 4 quads, soft, non-tender EXTREMITIES: Decreased ROM, no deformity, weakness to BLE, No muscle atrophy/subcutaneous fat loss, unsteady gait NEURO:  Increased weakness and mild cognitive impairment, Impaired balance PSYCH:  non-anxious affect, A & O x 3  Thank you for the opportunity to participate in the care of Lynn Robbins. Please call our main office at [phone] if we can be of additional assistance.    Joycelyn Man FNP-C  [email or web address] .[facility]  Phone:  [phone]

## 2022-06-04 ENCOUNTER — Telehealth: Payer: Self-pay | Admitting: Nurse Practitioner

## 2022-06-04 NOTE — Telephone Encounter (Signed)
Called to f/u. No answer for daughter. Left vm.

## 2022-06-07 ENCOUNTER — Encounter: Payer: Self-pay | Admitting: Nurse Practitioner

## 2022-06-07 ENCOUNTER — Telehealth: Payer: Self-pay

## 2022-06-07 ENCOUNTER — Telehealth: Payer: Self-pay | Admitting: Nurse Practitioner

## 2022-06-07 NOTE — Telephone Encounter (Signed)
Spoke with pt daughter she is in nursing home and she is having back advised her that she cn make virtual appt and for albuterol inhaler if they need refills we can send

## 2022-06-07 NOTE — Telephone Encounter (Signed)
Pt daughter send Mychart message about pt is having back pain Spoke with pt daughter she is in nursing home and she is having back advised her that she cn make virtual appt and for albuterol inhaler if they need refills we can send and facility will call and make virtual appt

## 2022-06-07 NOTE — Telephone Encounter (Signed)
Spoke to Diane by phone. She'll reach out to Dr Milta Deiters office to discuss hospice vs supportive care for patient's copd & back pain.

## 2022-06-09 DIAGNOSIS — C3411 Malignant neoplasm of upper lobe, right bronchus or lung: Secondary | ICD-10-CM | POA: Diagnosis not present

## 2022-06-09 DIAGNOSIS — J449 Chronic obstructive pulmonary disease, unspecified: Secondary | ICD-10-CM | POA: Diagnosis not present

## 2022-06-09 DIAGNOSIS — E119 Type 2 diabetes mellitus without complications: Secondary | ICD-10-CM | POA: Diagnosis not present

## 2022-06-09 DIAGNOSIS — I10 Essential (primary) hypertension: Secondary | ICD-10-CM | POA: Diagnosis not present

## 2022-06-09 DIAGNOSIS — K219 Gastro-esophageal reflux disease without esophagitis: Secondary | ICD-10-CM | POA: Diagnosis not present

## 2022-06-09 DIAGNOSIS — J9611 Chronic respiratory failure with hypoxia: Secondary | ICD-10-CM | POA: Diagnosis not present

## 2022-06-09 DIAGNOSIS — M545 Low back pain, unspecified: Secondary | ICD-10-CM | POA: Diagnosis not present

## 2022-06-18 DIAGNOSIS — R2689 Other abnormalities of gait and mobility: Secondary | ICD-10-CM | POA: Insufficient documentation

## 2022-06-18 DIAGNOSIS — R5381 Other malaise: Secondary | ICD-10-CM | POA: Insufficient documentation

## 2022-06-21 DIAGNOSIS — E119 Type 2 diabetes mellitus without complications: Secondary | ICD-10-CM | POA: Diagnosis not present

## 2022-06-21 DIAGNOSIS — R110 Nausea: Secondary | ICD-10-CM | POA: Diagnosis not present

## 2022-06-21 DIAGNOSIS — G8929 Other chronic pain: Secondary | ICD-10-CM | POA: Diagnosis not present

## 2022-06-21 DIAGNOSIS — I2782 Chronic pulmonary embolism: Secondary | ICD-10-CM | POA: Diagnosis not present

## 2022-06-21 DIAGNOSIS — I10 Essential (primary) hypertension: Secondary | ICD-10-CM | POA: Diagnosis not present

## 2022-06-21 DIAGNOSIS — J449 Chronic obstructive pulmonary disease, unspecified: Secondary | ICD-10-CM | POA: Diagnosis not present

## 2022-06-21 DIAGNOSIS — K219 Gastro-esophageal reflux disease without esophagitis: Secondary | ICD-10-CM | POA: Diagnosis not present

## 2022-06-23 DIAGNOSIS — R911 Solitary pulmonary nodule: Secondary | ICD-10-CM | POA: Diagnosis not present

## 2022-06-23 DIAGNOSIS — J449 Chronic obstructive pulmonary disease, unspecified: Secondary | ICD-10-CM | POA: Diagnosis not present

## 2022-06-23 DIAGNOSIS — I2782 Chronic pulmonary embolism: Secondary | ICD-10-CM | POA: Diagnosis not present

## 2022-06-23 DIAGNOSIS — I10 Essential (primary) hypertension: Secondary | ICD-10-CM | POA: Diagnosis not present

## 2022-06-23 DIAGNOSIS — E119 Type 2 diabetes mellitus without complications: Secondary | ICD-10-CM | POA: Diagnosis not present

## 2022-06-23 DIAGNOSIS — K219 Gastro-esophageal reflux disease without esophagitis: Secondary | ICD-10-CM | POA: Diagnosis not present

## 2022-06-23 DIAGNOSIS — C3412 Malignant neoplasm of upper lobe, left bronchus or lung: Secondary | ICD-10-CM | POA: Diagnosis not present

## 2022-06-24 DIAGNOSIS — R058 Other specified cough: Secondary | ICD-10-CM | POA: Diagnosis not present

## 2022-06-25 DIAGNOSIS — J449 Chronic obstructive pulmonary disease, unspecified: Secondary | ICD-10-CM | POA: Diagnosis not present

## 2022-06-25 DIAGNOSIS — R918 Other nonspecific abnormal finding of lung field: Secondary | ICD-10-CM | POA: Diagnosis not present

## 2022-06-25 DIAGNOSIS — Z79899 Other long term (current) drug therapy: Secondary | ICD-10-CM | POA: Diagnosis not present

## 2022-06-28 DIAGNOSIS — E119 Type 2 diabetes mellitus without complications: Secondary | ICD-10-CM | POA: Diagnosis not present

## 2022-06-28 DIAGNOSIS — D500 Iron deficiency anemia secondary to blood loss (chronic): Secondary | ICD-10-CM | POA: Diagnosis not present

## 2022-06-28 DIAGNOSIS — R058 Other specified cough: Secondary | ICD-10-CM | POA: Diagnosis not present

## 2022-07-03 DIAGNOSIS — B379 Candidiasis, unspecified: Secondary | ICD-10-CM | POA: Diagnosis not present

## 2022-07-06 DIAGNOSIS — I10 Essential (primary) hypertension: Secondary | ICD-10-CM | POA: Diagnosis not present

## 2022-07-08 DIAGNOSIS — E119 Type 2 diabetes mellitus without complications: Secondary | ICD-10-CM | POA: Diagnosis not present

## 2022-07-12 DIAGNOSIS — G8929 Other chronic pain: Secondary | ICD-10-CM | POA: Diagnosis not present

## 2022-07-19 DIAGNOSIS — G8929 Other chronic pain: Secondary | ICD-10-CM | POA: Diagnosis not present

## 2022-07-19 DIAGNOSIS — J449 Chronic obstructive pulmonary disease, unspecified: Secondary | ICD-10-CM | POA: Diagnosis not present

## 2022-07-19 DIAGNOSIS — I2782 Chronic pulmonary embolism: Secondary | ICD-10-CM | POA: Diagnosis not present

## 2022-07-21 DIAGNOSIS — E1165 Type 2 diabetes mellitus with hyperglycemia: Secondary | ICD-10-CM | POA: Diagnosis not present

## 2022-07-26 DIAGNOSIS — E119 Type 2 diabetes mellitus without complications: Secondary | ICD-10-CM | POA: Diagnosis not present

## 2022-07-27 ENCOUNTER — Inpatient Hospital Stay: Payer: 59 | Admitting: Oncology

## 2022-07-27 ENCOUNTER — Telehealth: Payer: Self-pay | Admitting: Oncology

## 2022-07-27 NOTE — Telephone Encounter (Signed)
Per MD cancel todays visits. no further f/u with me and the facility will dictate her care in the future. daughter states patient can never see [place] in person because of her lung status.

## 2022-07-27 NOTE — Telephone Encounter (Signed)
Called pts daughter and left vm about my chart visit. I rescheduled today's missed appt to 7/11 and asked her to call me back if this did not work or she had any questions

## 2022-07-29 ENCOUNTER — Ambulatory Visit: Payer: 59 | Admitting: Nurse Practitioner

## 2022-08-05 ENCOUNTER — Telehealth: Payer: Self-pay | Admitting: Nurse Practitioner

## 2022-08-05 NOTE — Telephone Encounter (Signed)
Per pt daughter pt is currently in nursing home she stated that we are no longer pt's pcp-nm

## 2022-08-10 DIAGNOSIS — G8929 Other chronic pain: Secondary | ICD-10-CM | POA: Diagnosis not present

## 2022-08-17 DIAGNOSIS — I10 Essential (primary) hypertension: Secondary | ICD-10-CM | POA: Diagnosis not present

## 2022-08-17 DIAGNOSIS — K5900 Constipation, unspecified: Secondary | ICD-10-CM | POA: Diagnosis not present

## 2022-08-17 DIAGNOSIS — K219 Gastro-esophageal reflux disease without esophagitis: Secondary | ICD-10-CM | POA: Diagnosis not present

## 2022-08-17 DIAGNOSIS — G8929 Other chronic pain: Secondary | ICD-10-CM | POA: Diagnosis not present

## 2022-08-17 DIAGNOSIS — I2782 Chronic pulmonary embolism: Secondary | ICD-10-CM | POA: Diagnosis not present

## 2022-08-17 DIAGNOSIS — J449 Chronic obstructive pulmonary disease, unspecified: Secondary | ICD-10-CM | POA: Diagnosis not present

## 2022-08-17 DIAGNOSIS — E119 Type 2 diabetes mellitus without complications: Secondary | ICD-10-CM | POA: Diagnosis not present

## 2022-08-26 ENCOUNTER — Telehealth: Payer: 59 | Admitting: Oncology

## 2022-09-13 DIAGNOSIS — M659 Synovitis and tenosynovitis, unspecified: Secondary | ICD-10-CM | POA: Diagnosis not present

## 2022-09-13 DIAGNOSIS — M79675 Pain in left toe(s): Secondary | ICD-10-CM | POA: Diagnosis not present

## 2022-09-13 DIAGNOSIS — L600 Ingrowing nail: Secondary | ICD-10-CM | POA: Diagnosis not present

## 2022-09-13 DIAGNOSIS — M79674 Pain in right toe(s): Secondary | ICD-10-CM | POA: Diagnosis not present

## 2022-09-13 DIAGNOSIS — E119 Type 2 diabetes mellitus without complications: Secondary | ICD-10-CM | POA: Diagnosis not present

## 2022-09-13 DIAGNOSIS — B351 Tinea unguium: Secondary | ICD-10-CM | POA: Diagnosis not present

## 2022-09-17 DIAGNOSIS — G8929 Other chronic pain: Secondary | ICD-10-CM | POA: Diagnosis not present

## 2022-09-17 DIAGNOSIS — J449 Chronic obstructive pulmonary disease, unspecified: Secondary | ICD-10-CM | POA: Diagnosis not present

## 2022-09-17 DIAGNOSIS — R42 Dizziness and giddiness: Secondary | ICD-10-CM | POA: Diagnosis not present

## 2022-09-17 DIAGNOSIS — H6120 Impacted cerumen, unspecified ear: Secondary | ICD-10-CM | POA: Diagnosis not present

## 2022-09-20 DIAGNOSIS — J449 Chronic obstructive pulmonary disease, unspecified: Secondary | ICD-10-CM | POA: Diagnosis not present

## 2022-09-20 DIAGNOSIS — K219 Gastro-esophageal reflux disease without esophagitis: Secondary | ICD-10-CM | POA: Diagnosis not present

## 2022-09-20 DIAGNOSIS — H6120 Impacted cerumen, unspecified ear: Secondary | ICD-10-CM | POA: Diagnosis not present

## 2022-09-20 DIAGNOSIS — I10 Essential (primary) hypertension: Secondary | ICD-10-CM | POA: Diagnosis not present

## 2022-09-21 DIAGNOSIS — I10 Essential (primary) hypertension: Secondary | ICD-10-CM | POA: Diagnosis not present

## 2022-09-30 DIAGNOSIS — D500 Iron deficiency anemia secondary to blood loss (chronic): Secondary | ICD-10-CM | POA: Diagnosis not present

## 2022-09-30 DIAGNOSIS — N189 Chronic kidney disease, unspecified: Secondary | ICD-10-CM | POA: Diagnosis not present

## 2022-10-12 DIAGNOSIS — G8929 Other chronic pain: Secondary | ICD-10-CM | POA: Diagnosis not present

## 2022-10-15 DIAGNOSIS — E119 Type 2 diabetes mellitus without complications: Secondary | ICD-10-CM | POA: Diagnosis not present

## 2022-10-19 DIAGNOSIS — E119 Type 2 diabetes mellitus without complications: Secondary | ICD-10-CM | POA: Diagnosis not present

## 2022-10-26 ENCOUNTER — Ambulatory Visit: Payer: 59 | Admitting: Nurse Practitioner

## 2022-11-04 DIAGNOSIS — E119 Type 2 diabetes mellitus without complications: Secondary | ICD-10-CM | POA: Diagnosis not present

## 2022-11-04 DIAGNOSIS — H2513 Age-related nuclear cataract, bilateral: Secondary | ICD-10-CM | POA: Diagnosis not present

## 2022-11-09 ENCOUNTER — Other Ambulatory Visit: Payer: Self-pay

## 2022-11-11 ENCOUNTER — Encounter: Payer: Self-pay | Admitting: Nurse Practitioner

## 2022-11-11 ENCOUNTER — Telehealth (INDEPENDENT_AMBULATORY_CARE_PROVIDER_SITE_OTHER): Payer: 59 | Admitting: Nurse Practitioner

## 2022-11-11 DIAGNOSIS — G8929 Other chronic pain: Secondary | ICD-10-CM | POA: Diagnosis not present

## 2022-11-11 DIAGNOSIS — G894 Chronic pain syndrome: Secondary | ICD-10-CM

## 2022-11-11 DIAGNOSIS — Z9981 Dependence on supplemental oxygen: Secondary | ICD-10-CM

## 2022-11-11 NOTE — Progress Notes (Signed)
[facility] [address] [place], [place] [address]  Internal MEDICINE  Telephone Visit  Patient Name: Lynn Robbins  [id number]  [id number]  Date of Service: 11/11/2022  I connected with the patient at 1700 by telephone and verified the patients identity using two identifiers.   I discussed the limitations, risks, security and privacy concerns of performing an evaluation and management service by telephone and the availability of in person appointments. I also discussed with the patient that there may be a patient responsible charge related to the service.  The patient expressed understanding and agrees to proceed.    Chief Complaint  Patient presents with   Telephone Assessment    Phone call number to use: [phone]   Telephone Screen   Medication Management    Patient unable to come into the office, had recent fall.    HPI Lynn Robbins presents for a telehealth virtual visit to check in with prior PCP.  She was admitted to a SNF in April has been receiving her care and being seen by the providers at the nursing home.  She is doing well per patient report, her daughter comes to see her at the nursing home regularly.    Current Medication: Outpatient Encounter Medications as of 11/11/2022  Medication Sig   Accu-Chek FastClix Lancets MISC Use as directed to check sugars once daily. DX E11.65   albuterol (VENTOLIN HFA) 108 (90 Base) MCG/ACT inhaler Inhale 2 puffs into the lungs every 6 (six) hours as needed for wheezing or shortness of breath.   apixaban (ELIQUIS) 5 MG TABS tablet Take 1 tablet (5 mg total) by mouth 2 (two) times daily.   atenolol (TENORMIN) 25 MG tablet Take 1 tablet (25 mg total) by mouth daily.   clonazePAM (KLONOPIN) 1 MG tablet Take 1 tablet (1 mg total) by mouth 2 (two) times daily as needed. for anxiety   GI Cocktail (alum & mag hydroxide, lidocaine, dicyclomine) oral mixture Take 10 mLs by mouth 2 (two) times daily as needed (abdominal pain).    glimepiride (AMARYL) 1 MG tablet TAKE 1 TABLET BY MOUTH DAILY WITH BREAKFAST   glucose blood (ACCU-CHEK GUIDE) test strip Use as instructed to check blood sugars once daily DX: E11.65   HYDROcodone-acetaminophen (NORCO) 7.5-325 MG tablet TAKE ONE TABLET BY MOUTH TWICE DAILY AS NEEDED FOR MODERATE OR SEVERE PAIN   losartan-hydrochlorothiazide (HYZAAR) 50-12.5 MG tablet TAKE 1 TABLET BY MOUTH ONCE DAILY   ondansetron (ZOFRAN-ODT) 4 MG disintegrating tablet Take 1 tablet (4 mg total) by mouth every 8 (eight) hours as needed for nausea or vomiting.   OXYGEN Inhale into the lungs.   pantoprazole (PROTONIX) 40 MG tablet Take 1 tablet (40 mg total) by mouth daily.   SitaGLIPtin-MetFORMIN HCl (JANUMET XR) 50-500 MG TB24 TAKE ONE TABLET EVERY DAY WITH FOOD FOR DIABETES   tiotropium (SPIRIVA HANDIHALER) 18 MCG inhalation capsule INHALE 1 CAPSULE VIA HANDIHALER ONCE DAILY AT THE SAME TIME EVERY DAY   No facility-administered encounter medications on file as of 11/11/2022.    Surgical History: Past Surgical History:  Procedure Laterality Date   CESAREAN SECTION      Medical History: Past Medical History:  Diagnosis Date   Cancer (HCC)    CHF (congestive heart failure) (HCC)    COPD (chronic obstructive pulmonary disease) (HCC)    Depression    Diabetes mellitus without complication (HCC)    HOH (hard of hearing)    Hypertension     Family History: Family History  Problem Relation Age of Onset   Colon cancer Mother    Aneurysm Mother 80       brain   Diabetes Father    Colon cancer Father    Lung cancer Sister    Breast cancer Sister    Lung cancer Sister    Cancer Sister        type unknown   Lung cancer Brother    Bone cancer Brother    Lung cancer Brother    Early death Daughter    Heart disease Daughter     Social History   Socioeconomic History   Marital status: Single    Spouse name: Not on file   Number of children: 5   Years of education: Not on file   Highest  education level: Not on file  Occupational History   Not on file  Tobacco Use   Smoking status: Former    Current packs/day: 0.00    Average packs/day: 3.0 packs/day for 57.0 years (171.0 ttl pk-yrs)    Types: Cigarettes    Start date: 04/16/1941    Quit date: 04/17/1998    Years since quitting: 24.5   Smokeless tobacco: Current    Types: Snuff  Vaping Use   Vaping status: Never Used  Substance and Sexual Activity   Alcohol use: No   Drug use: No   Sexual activity: Never  Other Topics Concern   Not on file  Social History Narrative   ** Merged History Encounter **       Social Determinants of Health   Financial Resource Strain: Low Risk  (10/03/2020)   Overall Financial Resource Strain (CARDIA)    Difficulty of Paying Living Expenses: Not very hard  Food Insecurity: No Food Insecurity (04/09/2022)   Hunger Vital Sign    Worried About Running Out of Food in the Last Year: Never true    Ran Out of Food in the Last Year: Never true  Transportation Needs: No Transportation Needs (04/09/2022)   PRAPARE - Administrator, Civil Service (Medical): No    Lack of Transportation (Non-Medical): No  Physical Activity: Not on file  Stress: Not on file  Social Connections: Not on file  Intimate Partner Violence: Not At Risk (04/09/2022)   Humiliation, Afraid, Rape, and Kick questionnaire    Fear of Current or Ex-Partner: No    Emotionally Abused: No    Physically Abused: No    Sexually Abused: No      Review of Systems  Constitutional:  Positive for activity change.  Respiratory: Negative.  Negative for cough, chest tightness, shortness of breath and wheezing.   Cardiovascular: Negative.  Negative for chest pain and palpitations.  Musculoskeletal:  Positive for arthralgias and back pain.  Psychiatric/Behavioral:  Positive for behavioral problems. Negative for self-injury, sleep disturbance and suicidal ideas. The patient is nervous/anxious.     Vital Signs: Resp 16    Ht 5\' 1"  (1.549 m)   BMI 30.99 kg/m    Observation/Objective: She is alert and oriented. No acute distress noted.    Assessment/Plan: 1. Chronic pain disorder Patient reports that the nursing home is giving her pain medication for her chronic pain. No issues here.   2. O2 dependent Still dependent on oxygen and she does wear it via nasal cannula most of the day and night   General Counseling: keerthi westfall understanding of the findings of today's phone visit and agrees with plan of treatment. I have discussed any further diagnostic  evaluation that may be needed or ordered today. We also reviewed her medications today. she has been encouraged to call the office with any questions or concerns that should arise related to todays visit.  Return for no follow up, not PCP anymore since she is in a nursing home. .   No orders of the defined types were placed in this encounter.   No orders of the defined types were placed in this encounter.   Time spent:30 Minutes Time spent with patient included reviewing progress notes, labs, imaging studies, and discussing plan for follow up.  [place] Controlled Substance Database was reviewed by me for overdose risk score (ORS) if appropriate.  This patient was seen by Sallyanne Kuster, FNP-C in collaboration with Dr. Beverely Risen as a part of collaborative care agreement.  Roch Quach R. Tedd Sias, MSN, FNP-C Internal medicine

## 2022-11-12 ENCOUNTER — Encounter: Payer: Self-pay | Admitting: Nurse Practitioner

## 2022-11-14 ENCOUNTER — Encounter: Payer: Self-pay | Admitting: Nurse Practitioner

## 2022-11-16 ENCOUNTER — Telehealth: Payer: 59 | Admitting: Nurse Practitioner

## 2022-11-24 DIAGNOSIS — C3490 Malignant neoplasm of unspecified part of unspecified bronchus or lung: Secondary | ICD-10-CM | POA: Diagnosis not present

## 2022-11-24 DIAGNOSIS — G8929 Other chronic pain: Secondary | ICD-10-CM | POA: Diagnosis not present

## 2022-11-24 DIAGNOSIS — E119 Type 2 diabetes mellitus without complications: Secondary | ICD-10-CM | POA: Diagnosis not present

## 2022-11-24 DIAGNOSIS — J449 Chronic obstructive pulmonary disease, unspecified: Secondary | ICD-10-CM | POA: Diagnosis not present

## 2022-11-24 DIAGNOSIS — I10 Essential (primary) hypertension: Secondary | ICD-10-CM | POA: Diagnosis not present

## 2022-11-24 DIAGNOSIS — I2782 Chronic pulmonary embolism: Secondary | ICD-10-CM | POA: Diagnosis not present

## 2022-12-08 DIAGNOSIS — L602 Onychogryphosis: Secondary | ICD-10-CM | POA: Diagnosis not present

## 2022-12-08 DIAGNOSIS — L603 Nail dystrophy: Secondary | ICD-10-CM | POA: Diagnosis not present

## 2022-12-08 DIAGNOSIS — Z7984 Long term (current) use of oral hypoglycemic drugs: Secondary | ICD-10-CM | POA: Diagnosis not present

## 2022-12-08 DIAGNOSIS — E1151 Type 2 diabetes mellitus with diabetic peripheral angiopathy without gangrene: Secondary | ICD-10-CM | POA: Diagnosis not present

## 2022-12-09 DIAGNOSIS — Z23 Encounter for immunization: Secondary | ICD-10-CM | POA: Diagnosis not present

## 2022-12-27 ENCOUNTER — Telehealth: Payer: Self-pay | Admitting: Nurse Practitioner

## 2022-12-27 DIAGNOSIS — K5900 Constipation, unspecified: Secondary | ICD-10-CM | POA: Diagnosis not present

## 2022-12-27 DIAGNOSIS — G8929 Other chronic pain: Secondary | ICD-10-CM | POA: Diagnosis not present

## 2022-12-27 NOTE — Telephone Encounter (Signed)
11/11/22 office note faxed to [facility] with [facility]; [phone]

## 2023-01-22 DIAGNOSIS — R279 Unspecified lack of coordination: Secondary | ICD-10-CM | POA: Diagnosis not present

## 2023-01-22 DIAGNOSIS — M6281 Muscle weakness (generalized): Secondary | ICD-10-CM | POA: Diagnosis not present

## 2023-01-24 DIAGNOSIS — I2782 Chronic pulmonary embolism: Secondary | ICD-10-CM | POA: Diagnosis not present

## 2023-01-24 DIAGNOSIS — J449 Chronic obstructive pulmonary disease, unspecified: Secondary | ICD-10-CM | POA: Diagnosis not present

## 2023-01-24 DIAGNOSIS — R279 Unspecified lack of coordination: Secondary | ICD-10-CM | POA: Diagnosis not present

## 2023-01-24 DIAGNOSIS — I10 Essential (primary) hypertension: Secondary | ICD-10-CM | POA: Diagnosis not present

## 2023-01-24 DIAGNOSIS — M6281 Muscle weakness (generalized): Secondary | ICD-10-CM | POA: Diagnosis not present

## 2023-01-25 DIAGNOSIS — M6281 Muscle weakness (generalized): Secondary | ICD-10-CM | POA: Diagnosis not present

## 2023-01-25 DIAGNOSIS — R279 Unspecified lack of coordination: Secondary | ICD-10-CM | POA: Diagnosis not present

## 2023-01-26 DIAGNOSIS — R279 Unspecified lack of coordination: Secondary | ICD-10-CM | POA: Diagnosis not present

## 2023-01-26 DIAGNOSIS — M6281 Muscle weakness (generalized): Secondary | ICD-10-CM | POA: Diagnosis not present

## 2023-01-27 DIAGNOSIS — R279 Unspecified lack of coordination: Secondary | ICD-10-CM | POA: Diagnosis not present

## 2023-01-27 DIAGNOSIS — M6281 Muscle weakness (generalized): Secondary | ICD-10-CM | POA: Diagnosis not present

## 2023-01-28 DIAGNOSIS — E119 Type 2 diabetes mellitus without complications: Secondary | ICD-10-CM | POA: Diagnosis not present

## 2023-01-28 DIAGNOSIS — I10 Essential (primary) hypertension: Secondary | ICD-10-CM | POA: Diagnosis not present

## 2023-01-31 ENCOUNTER — Ambulatory Visit: Payer: 59 | Admitting: Nurse Practitioner

## 2023-01-31 DIAGNOSIS — E119 Type 2 diabetes mellitus without complications: Secondary | ICD-10-CM | POA: Diagnosis not present

## 2023-01-31 DIAGNOSIS — D500 Iron deficiency anemia secondary to blood loss (chronic): Secondary | ICD-10-CM | POA: Diagnosis not present

## 2023-01-31 DIAGNOSIS — R279 Unspecified lack of coordination: Secondary | ICD-10-CM | POA: Diagnosis not present

## 2023-01-31 DIAGNOSIS — M6281 Muscle weakness (generalized): Secondary | ICD-10-CM | POA: Diagnosis not present

## 2023-02-01 DIAGNOSIS — R279 Unspecified lack of coordination: Secondary | ICD-10-CM | POA: Diagnosis not present

## 2023-02-01 DIAGNOSIS — M6281 Muscle weakness (generalized): Secondary | ICD-10-CM | POA: Diagnosis not present

## 2023-02-02 DIAGNOSIS — J449 Chronic obstructive pulmonary disease, unspecified: Secondary | ICD-10-CM | POA: Diagnosis not present

## 2023-02-02 DIAGNOSIS — M6281 Muscle weakness (generalized): Secondary | ICD-10-CM | POA: Diagnosis not present

## 2023-02-02 DIAGNOSIS — R279 Unspecified lack of coordination: Secondary | ICD-10-CM | POA: Diagnosis not present

## 2023-02-03 DIAGNOSIS — H6120 Impacted cerumen, unspecified ear: Secondary | ICD-10-CM | POA: Diagnosis not present

## 2023-02-03 DIAGNOSIS — R059 Cough, unspecified: Secondary | ICD-10-CM | POA: Diagnosis not present

## 2023-02-04 DIAGNOSIS — I10 Essential (primary) hypertension: Secondary | ICD-10-CM | POA: Diagnosis not present

## 2023-02-05 DIAGNOSIS — M6281 Muscle weakness (generalized): Secondary | ICD-10-CM | POA: Diagnosis not present

## 2023-02-05 DIAGNOSIS — R279 Unspecified lack of coordination: Secondary | ICD-10-CM | POA: Diagnosis not present

## 2023-02-17 NOTE — Telephone Encounter (Signed)
 Error

## 2023-03-14 DIAGNOSIS — G8929 Other chronic pain: Secondary | ICD-10-CM | POA: Diagnosis not present

## 2023-03-14 DIAGNOSIS — J449 Chronic obstructive pulmonary disease, unspecified: Secondary | ICD-10-CM | POA: Diagnosis not present

## 2023-03-23 DIAGNOSIS — J9611 Chronic respiratory failure with hypoxia: Secondary | ICD-10-CM | POA: Diagnosis not present

## 2023-03-23 DIAGNOSIS — G8929 Other chronic pain: Secondary | ICD-10-CM | POA: Diagnosis not present

## 2023-03-23 DIAGNOSIS — C3490 Malignant neoplasm of unspecified part of unspecified bronchus or lung: Secondary | ICD-10-CM | POA: Diagnosis not present

## 2023-03-23 DIAGNOSIS — I10 Essential (primary) hypertension: Secondary | ICD-10-CM | POA: Diagnosis not present

## 2023-03-23 DIAGNOSIS — I2782 Chronic pulmonary embolism: Secondary | ICD-10-CM | POA: Diagnosis not present

## 2023-03-23 DIAGNOSIS — J449 Chronic obstructive pulmonary disease, unspecified: Secondary | ICD-10-CM | POA: Diagnosis not present

## 2023-03-23 DIAGNOSIS — E119 Type 2 diabetes mellitus without complications: Secondary | ICD-10-CM | POA: Diagnosis not present

## 2023-03-28 DIAGNOSIS — E119 Type 2 diabetes mellitus without complications: Secondary | ICD-10-CM | POA: Diagnosis not present

## 2023-03-28 DIAGNOSIS — E559 Vitamin D deficiency, unspecified: Secondary | ICD-10-CM | POA: Diagnosis not present

## 2023-03-30 DIAGNOSIS — R918 Other nonspecific abnormal finding of lung field: Secondary | ICD-10-CM | POA: Diagnosis not present

## 2023-03-30 DIAGNOSIS — R059 Cough, unspecified: Secondary | ICD-10-CM | POA: Diagnosis not present

## 2023-03-30 DIAGNOSIS — J449 Chronic obstructive pulmonary disease, unspecified: Secondary | ICD-10-CM | POA: Diagnosis not present

## 2023-03-31 DIAGNOSIS — C3412 Malignant neoplasm of upper lobe, left bronchus or lung: Secondary | ICD-10-CM | POA: Diagnosis not present

## 2023-03-31 DIAGNOSIS — R059 Cough, unspecified: Secondary | ICD-10-CM | POA: Diagnosis not present

## 2023-03-31 DIAGNOSIS — J449 Chronic obstructive pulmonary disease, unspecified: Secondary | ICD-10-CM | POA: Diagnosis not present

## 2023-03-31 DIAGNOSIS — J9611 Chronic respiratory failure with hypoxia: Secondary | ICD-10-CM | POA: Diagnosis not present

## 2023-04-04 DIAGNOSIS — C3412 Malignant neoplasm of upper lobe, left bronchus or lung: Secondary | ICD-10-CM | POA: Diagnosis not present

## 2023-04-04 DIAGNOSIS — J9611 Chronic respiratory failure with hypoxia: Secondary | ICD-10-CM | POA: Diagnosis not present

## 2023-04-04 DIAGNOSIS — J449 Chronic obstructive pulmonary disease, unspecified: Secondary | ICD-10-CM | POA: Diagnosis not present

## 2023-04-04 DIAGNOSIS — R059 Cough, unspecified: Secondary | ICD-10-CM | POA: Diagnosis not present

## 2023-04-06 ENCOUNTER — Encounter: Payer: Self-pay | Admitting: Pulmonary Disease

## 2023-04-13 ENCOUNTER — Institutional Professional Consult (permissible substitution): Payer: 59 | Admitting: Pulmonary Disease

## 2023-04-13 DIAGNOSIS — R609 Edema, unspecified: Secondary | ICD-10-CM | POA: Diagnosis not present

## 2023-04-14 DIAGNOSIS — K219 Gastro-esophageal reflux disease without esophagitis: Secondary | ICD-10-CM | POA: Diagnosis not present

## 2023-04-14 DIAGNOSIS — R609 Edema, unspecified: Secondary | ICD-10-CM | POA: Diagnosis not present

## 2023-04-14 DIAGNOSIS — G8929 Other chronic pain: Secondary | ICD-10-CM | POA: Diagnosis not present

## 2023-04-15 DIAGNOSIS — Z7984 Long term (current) use of oral hypoglycemic drugs: Secondary | ICD-10-CM | POA: Diagnosis not present

## 2023-04-15 DIAGNOSIS — L602 Onychogryphosis: Secondary | ICD-10-CM | POA: Diagnosis not present

## 2023-04-15 DIAGNOSIS — I10 Essential (primary) hypertension: Secondary | ICD-10-CM | POA: Diagnosis not present

## 2023-04-15 DIAGNOSIS — E1151 Type 2 diabetes mellitus with diabetic peripheral angiopathy without gangrene: Secondary | ICD-10-CM | POA: Diagnosis not present

## 2023-04-15 DIAGNOSIS — L84 Corns and callosities: Secondary | ICD-10-CM | POA: Diagnosis not present

## 2023-04-15 DIAGNOSIS — L603 Nail dystrophy: Secondary | ICD-10-CM | POA: Diagnosis not present

## 2023-04-19 DIAGNOSIS — Z79899 Other long term (current) drug therapy: Secondary | ICD-10-CM | POA: Diagnosis not present

## 2023-04-21 DIAGNOSIS — M6281 Muscle weakness (generalized): Secondary | ICD-10-CM | POA: Diagnosis not present

## 2023-04-22 DIAGNOSIS — M6281 Muscle weakness (generalized): Secondary | ICD-10-CM | POA: Diagnosis not present

## 2023-04-25 DIAGNOSIS — M6281 Muscle weakness (generalized): Secondary | ICD-10-CM | POA: Diagnosis not present

## 2023-04-26 DIAGNOSIS — M6281 Muscle weakness (generalized): Secondary | ICD-10-CM | POA: Diagnosis not present

## 2023-04-27 DIAGNOSIS — M6281 Muscle weakness (generalized): Secondary | ICD-10-CM | POA: Diagnosis not present

## 2023-04-28 DIAGNOSIS — M6281 Muscle weakness (generalized): Secondary | ICD-10-CM | POA: Diagnosis not present

## 2023-04-29 DIAGNOSIS — E559 Vitamin D deficiency, unspecified: Secondary | ICD-10-CM | POA: Diagnosis not present

## 2023-04-29 DIAGNOSIS — M6281 Muscle weakness (generalized): Secondary | ICD-10-CM | POA: Diagnosis not present

## 2023-05-02 DIAGNOSIS — M6281 Muscle weakness (generalized): Secondary | ICD-10-CM | POA: Diagnosis not present

## 2023-05-03 DIAGNOSIS — J984 Other disorders of lung: Secondary | ICD-10-CM | POA: Diagnosis not present

## 2023-05-03 DIAGNOSIS — M6281 Muscle weakness (generalized): Secondary | ICD-10-CM | POA: Diagnosis not present

## 2023-05-04 DIAGNOSIS — M6281 Muscle weakness (generalized): Secondary | ICD-10-CM | POA: Diagnosis not present

## 2023-05-05 DIAGNOSIS — M6281 Muscle weakness (generalized): Secondary | ICD-10-CM | POA: Diagnosis not present

## 2023-05-07 DIAGNOSIS — M6281 Muscle weakness (generalized): Secondary | ICD-10-CM | POA: Diagnosis not present

## 2023-05-09 ENCOUNTER — Ambulatory Visit: Payer: 59 | Admitting: Pulmonary Disease

## 2023-05-09 ENCOUNTER — Encounter: Payer: Self-pay | Admitting: Pulmonary Disease

## 2023-05-09 VITALS — BP 110/68 | HR 82 | Temp 97.3°F | Ht 61.0 in | Wt 146.4 lb

## 2023-05-09 DIAGNOSIS — Z85118 Personal history of other malignant neoplasm of bronchus and lung: Secondary | ICD-10-CM

## 2023-05-09 DIAGNOSIS — J449 Chronic obstructive pulmonary disease, unspecified: Secondary | ICD-10-CM | POA: Diagnosis not present

## 2023-05-09 DIAGNOSIS — Z9981 Dependence on supplemental oxygen: Secondary | ICD-10-CM | POA: Diagnosis not present

## 2023-05-09 DIAGNOSIS — M6281 Muscle weakness (generalized): Secondary | ICD-10-CM | POA: Diagnosis not present

## 2023-05-09 DIAGNOSIS — J9611 Chronic respiratory failure with hypoxia: Secondary | ICD-10-CM

## 2023-05-09 DIAGNOSIS — R918 Other nonspecific abnormal finding of lung field: Secondary | ICD-10-CM

## 2023-05-09 DIAGNOSIS — Z87891 Personal history of nicotine dependence: Secondary | ICD-10-CM | POA: Diagnosis not present

## 2023-05-09 DIAGNOSIS — C3490 Malignant neoplasm of unspecified part of unspecified bronchus or lung: Secondary | ICD-10-CM

## 2023-05-09 MED ORDER — ALBUTEROL SULFATE HFA 108 (90 BASE) MCG/ACT IN AERS: 2.0000 | INHALATION_SPRAY | RESPIRATORY_TRACT | 2 refills | Status: DC | PRN

## 2023-05-09 NOTE — Progress Notes (Signed)
 Synopsis: Referred in by Letitia Libra, NP   Subjective:   PATIENT ID: Lynn Robbins GENDER: female DOB: 1940-12-15, MRN: [id number]  Chief Complaint  Patient presents with   Consult    SOB on O2.     HPI Lynn Robbins is a pleasant 83 years old female patient with a past medical history of left upper lobe cancer ( Not biopsied given high risk status however treated with SBRT) and followed with Dr. Smith Robert, COPD followed with Dr. Welton Flakes in the past presenting today to the pulmonary clinic for multiple episodes of pneumonia in the past year and further management of her COPD.   [facility]. Mostly sedentary and needs assistance with moving around.   She has missed multiple appointments with oncology due to rides or illness. Last seen by Oncology in hospital on 03/2022 where a repeat CT chest was obtained that showed a worsening left upper lobe opacity measuring 8.1cm previously measured 3.1cm. This was concerning for recurrent lung cancer. This was never biopsied nor followed up due to missing appointments.   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   She is accompanied by her daughter today and her main complaint is that she is having a lot of diarrhea. And they are not giving her her albuterol inhaler. She has a poor appetite and low energy. Patient is very hard of hearing.   ROS All systems were reviewed and are negative except for the above.  Objective:   Vitals:   05/09/23 1327  BP: 110/68  Pulse: 82  Temp: (!) 97.3 F (36.3 C)  SpO2: 98%  Weight: 146 lb 6.4 oz (66.4 kg)  Height: 5\' 1"  (1.549 m)   98% on RA BMI Readings from Last 3 Encounters:  05/09/23 27.66 kg/m   11/11/22 30.99 kg/m  04/09/22 30.99 kg/m   Wt Readings from Last 3 Encounters:  05/09/23 146 lb 6.4 oz (66.4 kg)  04/09/22 164 lb 0.4 oz (74.4 kg)  01/27/22 159 lb (72.1 kg)    Physical Exam GEN: NAD, chronically ill.  HEENT: Supple Neck, Reactive Pupils, EOMI  CVS: Normal S1, Normal S2, RRR, No murmurs or ES appreciated  Lungs: poor air movement.   Abdomen: Soft, non tender, non distended, + BS  Extremities: Warm and well perfused, No edema   Labs and imaging were reviewed.  Ancillary Information   CBC    Component Value Date/Time   WBC 7.5 04/10/2022 0458   RBC 2.86 (L) 04/10/2022 0458   HGB 8.7 (L) 04/10/2022 0458   HGB 10.7 (L) 02/18/2022 1914   HCT 30.3 (L) 04/10/2022 0458   HCT 32.7 (L) 02/18/2022 1914   PLT 192 04/10/2022 0458   PLT 299 02/18/2022 1914   MCV 105.9 (H) 04/10/2022 0458   MCV 96 02/18/2022 1914   MCV 95 01/29/2014 1101   MCH 30.4 04/10/2022 0458   MCHC 28.7 (L) 04/10/2022 0458   RDW 14.4 04/10/2022 0458   RDW 14.6 02/18/2022 1914   RDW 14.1 01/29/2014 1101   LYMPHSABS 1.0 02/18/2022 1914   MONOABS 0.5 11/15/2019 1143   EOSABS 0.1 02/18/2022 1914   BASOSABS 0.0 02/18/2022 1914  No data to display           Assessment & Plan:  Lynn Robbins is a pleasant 83 years old female patient with a past medical history of left upper lobe cancer ( Not biopsied given high risk status however treated with SBRT) and followed with Dr. Smith Robert, COPD followed with Dr. Welton Flakes in the past presenting today to the pulmonary clinic for multiple episodes of pneumonia in the past year and further management of her COPD.   #Lung nodule suspicious for lung cancer s/p SBRT in 2020 (not biopsied due to high risk).  #Now with growing LUL lung mass suspicious for recurrent lung cancer.  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 Given the last scan was in 2024 we elected to proceed with a PET scan before deciding on the next step.   Discussed what the next steps will look like including Transthoracic biopsy vs Hospice care if no treatment is desired however we elected to make a decision after her PET scan is done.   Regarding her COPD, I agree with Breztri BID and Albuterol on an as needed basis.   Regarding her ongoing diarrhea, I advised her daughter to make the staff aware of excessive laxatives and discuss with PCP pancreatic enzyme replacement and imodium if needed.   Return in about 4 weeks (around 06/06/2023).  I spent 60 minutes caring for this patient today, including preparing to see the patient, obtaining a medical history , reviewing a separately obtained history, performing a medically appropriate examination and/or evaluation, counseling and educating the patient/family/caregiver, ordering medications, tests, or procedures, documenting clinical information in the electronic health record, and independently interpreting results (not separately reported/billed) and communicating results to the patient/family/caregiver  Janann Colonel, MD [facility] 05/09/2023 3:05 PM

## 2023-05-11 ENCOUNTER — Ambulatory Visit
Admission: RE | Admit: 2023-05-11 | Discharge: 2023-05-11 | Disposition: A | Discharge status: Home / Self Care | Source: Ambulatory Visit | Attending: Pulmonary Disease | Admitting: Pulmonary Disease

## 2023-05-11 DIAGNOSIS — J439 Emphysema, unspecified: Secondary | ICD-10-CM | POA: Diagnosis not present

## 2023-05-11 DIAGNOSIS — K8020 Calculus of gallbladder without cholecystitis without obstruction: Secondary | ICD-10-CM | POA: Diagnosis not present

## 2023-05-11 DIAGNOSIS — C3490 Malignant neoplasm of unspecified part of unspecified bronchus or lung: Secondary | ICD-10-CM | POA: Insufficient documentation

## 2023-05-11 DIAGNOSIS — C3411 Malignant neoplasm of upper lobe, right bronchus or lung: Secondary | ICD-10-CM | POA: Diagnosis not present

## 2023-05-11 DIAGNOSIS — I700 Atherosclerosis of aorta: Secondary | ICD-10-CM | POA: Insufficient documentation

## 2023-05-11 DIAGNOSIS — N200 Calculus of kidney: Secondary | ICD-10-CM | POA: Insufficient documentation

## 2023-05-11 LAB — GLUCOSE, CAPILLARY: Glucose-Capillary: 103 mg/dL — ABNORMAL HIGH (ref 70–99)

## 2023-05-11 MED ORDER — FLUDEOXYGLUCOSE F - 18 (FDG) INJECTION
7.7800 | Freq: Once | INTRAVENOUS | Status: AC | PRN
  Administered 2023-05-11: 7.78 via INTRAVENOUS

## 2023-05-12 DIAGNOSIS — C3412 Malignant neoplasm of upper lobe, left bronchus or lung: Secondary | ICD-10-CM | POA: Diagnosis not present

## 2023-05-12 DIAGNOSIS — G8929 Other chronic pain: Secondary | ICD-10-CM | POA: Diagnosis not present

## 2023-05-16 DIAGNOSIS — G8929 Other chronic pain: Secondary | ICD-10-CM | POA: Diagnosis not present

## 2023-05-19 DIAGNOSIS — N182 Chronic kidney disease, stage 2 (mild): Secondary | ICD-10-CM | POA: Diagnosis not present

## 2023-05-19 DIAGNOSIS — E785 Hyperlipidemia, unspecified: Secondary | ICD-10-CM | POA: Diagnosis not present

## 2023-05-19 DIAGNOSIS — E119 Type 2 diabetes mellitus without complications: Secondary | ICD-10-CM | POA: Diagnosis not present

## 2023-05-19 DIAGNOSIS — D500 Iron deficiency anemia secondary to blood loss (chronic): Secondary | ICD-10-CM | POA: Diagnosis not present

## 2023-05-27 DIAGNOSIS — H25813 Combined forms of age-related cataract, bilateral: Secondary | ICD-10-CM | POA: Diagnosis not present

## 2023-06-02 ENCOUNTER — Encounter: Payer: Self-pay | Admitting: Pulmonary Disease

## 2023-06-02 NOTE — Telephone Encounter (Signed)
Patient would like PET scan results

## 2023-06-09 DIAGNOSIS — K5900 Constipation, unspecified: Secondary | ICD-10-CM | POA: Diagnosis not present

## 2023-06-10 ENCOUNTER — Emergency Department

## 2023-06-10 ENCOUNTER — Emergency Department
Admission: EM | Admit: 2023-06-10 | Discharge: 2023-06-10 | Disposition: A | Discharge status: Home / Self Care | Attending: Emergency Medicine | Admitting: Emergency Medicine

## 2023-06-10 ENCOUNTER — Other Ambulatory Visit: Payer: Self-pay

## 2023-06-10 DIAGNOSIS — I509 Heart failure, unspecified: Secondary | ICD-10-CM | POA: Diagnosis not present

## 2023-06-10 DIAGNOSIS — E119 Type 2 diabetes mellitus without complications: Secondary | ICD-10-CM | POA: Diagnosis not present

## 2023-06-10 DIAGNOSIS — J449 Chronic obstructive pulmonary disease, unspecified: Secondary | ICD-10-CM | POA: Insufficient documentation

## 2023-06-10 DIAGNOSIS — Z85118 Personal history of other malignant neoplasm of bronchus and lung: Secondary | ICD-10-CM | POA: Insufficient documentation

## 2023-06-10 DIAGNOSIS — J9690 Respiratory failure, unspecified, unspecified whether with hypoxia or hypercapnia: Secondary | ICD-10-CM | POA: Diagnosis not present

## 2023-06-10 DIAGNOSIS — R197 Diarrhea, unspecified: Secondary | ICD-10-CM | POA: Diagnosis not present

## 2023-06-10 DIAGNOSIS — I10 Essential (primary) hypertension: Secondary | ICD-10-CM | POA: Diagnosis not present

## 2023-06-10 DIAGNOSIS — K5730 Diverticulosis of large intestine without perforation or abscess without bleeding: Secondary | ICD-10-CM | POA: Diagnosis not present

## 2023-06-10 DIAGNOSIS — I700 Atherosclerosis of aorta: Secondary | ICD-10-CM | POA: Insufficient documentation

## 2023-06-10 DIAGNOSIS — Z743 Need for continuous supervision: Secondary | ICD-10-CM | POA: Diagnosis not present

## 2023-06-10 DIAGNOSIS — E860 Dehydration: Secondary | ICD-10-CM | POA: Insufficient documentation

## 2023-06-10 DIAGNOSIS — I110 Hypertensive heart disease with heart failure: Secondary | ICD-10-CM | POA: Insufficient documentation

## 2023-06-10 DIAGNOSIS — C3490 Malignant neoplasm of unspecified part of unspecified bronchus or lung: Secondary | ICD-10-CM | POA: Diagnosis not present

## 2023-06-10 DIAGNOSIS — R69 Illness, unspecified: Secondary | ICD-10-CM | POA: Diagnosis not present

## 2023-06-10 DIAGNOSIS — N200 Calculus of kidney: Secondary | ICD-10-CM | POA: Diagnosis not present

## 2023-06-10 LAB — COMPREHENSIVE METABOLIC PANEL WITH GFR
ALT: 12 U/L (ref 0–44)
AST: 15 U/L (ref 15–41)
Albumin: <1.5 g/dL — ABNORMAL LOW (ref 3.5–5.0)
Alkaline Phosphatase: 116 U/L (ref 38–126)
Anion gap: 8 (ref 5–15)
BUN: 21 mg/dL (ref 8–23)
CO2: 27 mmol/L (ref 22–32)
Calcium: 7.7 mg/dL — ABNORMAL LOW (ref 8.9–10.3)
Chloride: 105 mmol/L (ref 98–111)
Creatinine, Ser: 0.93 mg/dL (ref 0.44–1.00)
GFR, Estimated: >60 mL/min (ref >60)
Glucose, Bld: 132 mg/dL — ABNORMAL HIGH (ref 70–99)
Potassium: 3.9 mmol/L (ref 3.5–5.1)
Sodium: 140 mmol/L (ref 135–145)
Total Bilirubin: 0.3 mg/dL (ref 0.0–1.2)
Total Protein: 5.6 g/dL — ABNORMAL LOW (ref 6.5–8.1)

## 2023-06-10 LAB — CBC
HCT: 29.1 % — ABNORMAL LOW (ref 36.0–46.0)
Hemoglobin: 9.2 g/dL — ABNORMAL LOW (ref 12.0–15.0)
MCH: 30.7 pg (ref 26.0–34.0)
MCHC: 31.6 g/dL (ref 30.0–36.0)
MCV: 97 fL (ref 80.0–100.0)
Platelets: 334 10*3/uL (ref 150–400)
RBC: 3 MIL/uL — ABNORMAL LOW (ref 3.87–5.11)
RDW: 16.2 % — ABNORMAL HIGH (ref 11.5–15.5)
WBC: 11.3 10*3/uL — ABNORMAL HIGH (ref 4.0–10.5)
nRBC: 0 % (ref 0.0–0.2)

## 2023-06-10 LAB — LIPASE, BLOOD: Lipase: 21 U/L (ref 11–51)

## 2023-06-10 MED ORDER — AZITHROMYCIN 500 MG PO TABS: 500.0000 mg | ORAL_TABLET | Freq: Every day | ORAL | 0 refills | Status: AC

## 2023-06-10 MED ORDER — IOHEXOL 300 MG/ML  SOLN
100.0000 mL | Freq: Once | INTRAMUSCULAR | Status: AC | PRN
  Administered 2023-06-10: 100 mL via INTRAVENOUS

## 2023-06-10 MED ORDER — SODIUM CHLORIDE 0.9 % IV BOLUS
1000.0000 mL | Freq: Once | INTRAVENOUS | Status: AC
  Administered 2023-06-10: 1000 mL via INTRAVENOUS

## 2023-06-10 NOTE — Discharge Instructions (Addendum)
 Please hold all stool softeners and laxatives until diarrhea resolves.

## 2023-06-10 NOTE — ED Notes (Signed)
 This RN and [place] NT cleaned pt at this time. Soiled with feces. Pt is cleaned and dry at this time.

## 2023-06-10 NOTE — ED Notes (Signed)
 This RN called daughter Diane Chamber to give update on patient poc

## 2023-06-10 NOTE — ED Provider Notes (Signed)
 Emergency department handoff note  Care of this patient was signed out to me at the end of the previous provider shift.  All pertinent patient information was conveyed and all questions were answered.  Patient pending CT of the abdomen and pelvis that shows mild colitis.  Given patient's duration of symptoms and proximity to healthcare acquired illnesses, will treat empirically with 3 days of azithromycin  The patient has been reexamined and is ready to be discharged.  All diagnostic results have been reviewed and discussed with the patient/family.  Care plan has been outlined and the patient/family understands all current diagnoses, results, and treatment plans.  There are no new complaints, changes, or physical findings at this time.  All questions have been addressed and answered.  Patient was instructed to, and agrees to follow-up with their primary care physician as well as return to the emergency department if any new or worsening symptoms develop.   Brayon Bielefeld K, MD 06/10/23 [phone]

## 2023-06-10 NOTE — ED Provider Notes (Signed)
 [facility] Provider Note    Event Date/Time   First MD Initiated Contact with Patient 06/10/23 1206     (approximate)   History   Chief Complaint: Diarrhea and abnormal xray   HPI  Lynn Robbins is a 83 y.o. female with a history of CHF, COPD, diabetes, hypertension, lung cancer status post radiation treatment presented to the ED from [facility] due to abnormal abdominal x-ray.  She has been having diarrhea for the last few days.  X-ray report sent with the patient which describes air-fluid levels and concern for obstruction.  Patient denies nausea or vomiting.        Past Medical History:  Diagnosis Date   Cancer (HCC)    CHF (congestive heart failure) (HCC)    COPD (chronic obstructive pulmonary disease) (HCC)    Depression    Diabetes mellitus without complication (HCC)    HOH (hard of hearing)    Hypertension     Current Outpatient Rx   Order #: [id number] Class: Normal   Order #: [id number] Class: Normal   Order #: [id number] Class: Normal   Order #: [id number] Class: Normal   Order #: [id number] Class: Normal   Order #: [id number] Class: Historical Med   Order #: [id number] Class: Normal   Order #: [id number] Class: Normal   Order #: [id number] Class: Normal   Order #: [id number] Class: Normal   Order #: [id number] Class: Historical Med   Order #: [id number] Class: Print   Order #: [id number] Class: Normal   Order #: [id number] Class: Historical Med   Order #: [id number] Class: Historical Med   Order #: [id number] Class: Normal   Order #: [id number] Class: Historical Med   Order #: [id number] Class: Historical Med   Order #: [id number] Class: Normal   Order #: [id number] Class: Historical Med   Order #: [id number] Class: Normal    Past Surgical History:  Procedure Laterality Date   CESAREAN SECTION      Physical Exam   Triage Vital Signs: ED Triage Vitals  Encounter Vitals Group     BP 06/10/23 1121 125/85     Systolic BP Percentile --       Diastolic BP Percentile --      Pulse Rate 06/10/23 1121 75     Resp 06/10/23 1121 18     Temp 06/10/23 1121 98 F (36.7 C)     Temp src --      SpO2 06/10/23 1121 94 %     Weight --      Height --      Head Circumference --      Peak Flow --      Pain Score 06/10/23 1115 0     Pain Loc --      Pain Education --      Exclude from Growth Chart --     Most recent vital signs: Vitals:   06/10/23 1121 06/10/23 1214  BP: 125/85   Pulse: 75   Resp: 18   Temp: 98 F (36.7 C)   SpO2: 94% 96%    General: Awake, no distress.  CV:  Good peripheral perfusion.  Regular rate rhythm Resp:  Normal effort.  Clear to auscultation bilaterally Abd:  No distention.  Soft with mild epigastric tenderness.  No hernias Other:  Dry oral mucosa   ED Results / Procedures / Treatments   Labs (all labs ordered are listed, but only abnormal results are displayed) Labs Reviewed  COMPREHENSIVE METABOLIC PANEL WITH GFR - Abnormal; Notable for the following components:  Result Value   Glucose, Bld 132 (*)    Calcium 7.7 (*)    Total Protein 5.6 (*)    Albumin <1.5 (*)    All other components within normal limits  CBC - Abnormal; Notable for the following components:   WBC 11.3 (*)    RBC 3.00 (*)    Hemoglobin 9.2 (*)    HCT 29.1 (*)    RDW 16.2 (*)    All other components within normal limits  LIPASE, BLOOD  URINALYSIS, ROUTINE W REFLEX MICROSCOPIC     EKG    RADIOLOGY    PROCEDURES:  Procedures   MEDICATIONS ORDERED IN ED: Medications  sodium chloride  0.9 % bolus 1,000 mL (1,000 mLs Intravenous New Bag/Given 06/10/23 1226)     IMPRESSION / MDM / ASSESSMENT AND PLAN / ED COURSE  I reviewed the triage vital signs and the nursing notes.  DDx: Dehydration, AKI, electrolyte derangement, anemia, bowel obstruction, diarrheal illness  Patient's presentation is most consistent with acute presentation with potential threat to life or bodily function.  Patient sent to the  ED for abnormal abdominal x-ray in the setting of a diarrheal illness.  She does appear to be somewhat dehydrated, will give IV fluids while checking labs and CT scan.  Doubt obstruction but due to her age and abnormal x-ray, will obtain a CT to further evaluate.    ----------------------------------------- 1:43 PM on 06/10/2023 ----------------------------------------- Labs unremarkable, consistent with chronic malnutrition.  CT pending.     FINAL CLINICAL IMPRESSION(S) / ED DIAGNOSES   Final diagnoses:  Diarrhea of presumed infectious origin  Dehydration     Rx / DC Orders   ED Discharge Orders     None        Note:  This document was prepared using Dragon voice recognition software and may include unintentional dictation errors.   Jacquie Maudlin, MD 06/10/23 1343

## 2023-06-10 NOTE — ED Triage Notes (Signed)
 First Nurse Note:  Pt via [facility] from [facility]. Pt had an abd XR yesterday, said it was abnormal. Pt c/o diarrhea but denies any pain. Pt is A&OX4 and NAD 95% on RA  120/62 HR  76 HT

## 2023-06-10 NOTE — ED Notes (Signed)
 This RN gave report to Orelia Binet, Charity fundraiser at [employer]

## 2023-06-10 NOTE — ED Triage Notes (Signed)
 Pt comes via [facility] from [facility] with concern of abnormal xray. Notes indicate pt may have return of cancer. Pt also having diarrhea.

## 2023-06-10 NOTE — ED Notes (Signed)
 [facility]  CALLED  FOR  TRANSPORT  TO  [facility]

## 2023-06-10 NOTE — ED Notes (Signed)
 Two warm blankets provided by this RN.

## 2023-06-13 ENCOUNTER — Ambulatory Visit: Admitting: Pulmonary Disease

## 2023-06-15 DIAGNOSIS — C3490 Malignant neoplasm of unspecified part of unspecified bronchus or lung: Secondary | ICD-10-CM | POA: Diagnosis not present

## 2023-06-15 DIAGNOSIS — E1151 Type 2 diabetes mellitus with diabetic peripheral angiopathy without gangrene: Secondary | ICD-10-CM | POA: Diagnosis not present

## 2023-06-15 DIAGNOSIS — Z7984 Long term (current) use of oral hypoglycemic drugs: Secondary | ICD-10-CM | POA: Diagnosis not present

## 2023-06-15 DIAGNOSIS — R109 Unspecified abdominal pain: Secondary | ICD-10-CM | POA: Diagnosis not present

## 2023-06-15 DIAGNOSIS — L603 Nail dystrophy: Secondary | ICD-10-CM | POA: Diagnosis not present

## 2023-06-15 DIAGNOSIS — L602 Onychogryphosis: Secondary | ICD-10-CM | POA: Diagnosis not present

## 2023-07-07 DIAGNOSIS — H2513 Age-related nuclear cataract, bilateral: Secondary | ICD-10-CM | POA: Diagnosis not present

## 2023-07-07 DIAGNOSIS — H04123 Dry eye syndrome of bilateral lacrimal glands: Secondary | ICD-10-CM | POA: Diagnosis not present

## 2023-07-07 DIAGNOSIS — H2512 Age-related nuclear cataract, left eye: Secondary | ICD-10-CM | POA: Diagnosis not present

## 2023-07-07 DIAGNOSIS — E119 Type 2 diabetes mellitus without complications: Secondary | ICD-10-CM | POA: Diagnosis not present

## 2023-07-07 DIAGNOSIS — H538 Other visual disturbances: Secondary | ICD-10-CM | POA: Diagnosis not present

## 2023-07-13 DIAGNOSIS — C3490 Malignant neoplasm of unspecified part of unspecified bronchus or lung: Secondary | ICD-10-CM | POA: Diagnosis not present

## 2023-07-13 DIAGNOSIS — J449 Chronic obstructive pulmonary disease, unspecified: Secondary | ICD-10-CM | POA: Diagnosis not present

## 2023-07-13 DIAGNOSIS — I2782 Chronic pulmonary embolism: Secondary | ICD-10-CM | POA: Diagnosis not present

## 2023-07-13 DIAGNOSIS — G8929 Other chronic pain: Secondary | ICD-10-CM | POA: Diagnosis not present

## 2023-07-13 DIAGNOSIS — E119 Type 2 diabetes mellitus without complications: Secondary | ICD-10-CM | POA: Diagnosis not present

## 2023-07-13 DIAGNOSIS — J9611 Chronic respiratory failure with hypoxia: Secondary | ICD-10-CM | POA: Diagnosis not present

## 2023-07-13 DIAGNOSIS — I10 Essential (primary) hypertension: Secondary | ICD-10-CM | POA: Diagnosis not present

## 2023-07-14 DIAGNOSIS — H2512 Age-related nuclear cataract, left eye: Secondary | ICD-10-CM | POA: Diagnosis not present

## 2023-07-14 DIAGNOSIS — H04123 Dry eye syndrome of bilateral lacrimal glands: Secondary | ICD-10-CM | POA: Diagnosis not present

## 2023-07-22 ENCOUNTER — Observation Stay
Admission: EM | Admit: 2023-07-22 | Discharge: 2023-07-24 | Disposition: A | Discharge status: Skilled Nursing Facility | Attending: Internal Medicine | Admitting: Internal Medicine

## 2023-07-22 ENCOUNTER — Emergency Department

## 2023-07-22 ENCOUNTER — Other Ambulatory Visit: Payer: Self-pay

## 2023-07-22 DIAGNOSIS — J9611 Chronic respiratory failure with hypoxia: Secondary | ICD-10-CM | POA: Insufficient documentation

## 2023-07-22 DIAGNOSIS — N179 Acute kidney failure, unspecified: Secondary | ICD-10-CM | POA: Diagnosis not present

## 2023-07-22 DIAGNOSIS — D761 Hemophagocytic lymphohistiocytosis: Secondary | ICD-10-CM | POA: Insufficient documentation

## 2023-07-22 DIAGNOSIS — E119 Type 2 diabetes mellitus without complications: Secondary | ICD-10-CM | POA: Diagnosis not present

## 2023-07-22 DIAGNOSIS — J9610 Chronic respiratory failure, unspecified whether with hypoxia or hypercapnia: Secondary | ICD-10-CM | POA: Diagnosis present

## 2023-07-22 DIAGNOSIS — I10 Essential (primary) hypertension: Secondary | ICD-10-CM | POA: Diagnosis present

## 2023-07-22 DIAGNOSIS — N390 Urinary tract infection, site not specified: Secondary | ICD-10-CM

## 2023-07-22 DIAGNOSIS — R6520 Severe sepsis without septic shock: Secondary | ICD-10-CM | POA: Diagnosis not present

## 2023-07-22 DIAGNOSIS — N3000 Acute cystitis without hematuria: Secondary | ICD-10-CM

## 2023-07-22 DIAGNOSIS — Z87891 Personal history of nicotine dependence: Secondary | ICD-10-CM | POA: Insufficient documentation

## 2023-07-22 DIAGNOSIS — I110 Hypertensive heart disease with heart failure: Secondary | ICD-10-CM | POA: Diagnosis not present

## 2023-07-22 DIAGNOSIS — E872 Acidosis, unspecified: Secondary | ICD-10-CM

## 2023-07-22 DIAGNOSIS — I5030 Unspecified diastolic (congestive) heart failure: Secondary | ICD-10-CM | POA: Insufficient documentation

## 2023-07-22 DIAGNOSIS — I2724 Chronic thromboembolic pulmonary hypertension: Secondary | ICD-10-CM | POA: Diagnosis not present

## 2023-07-22 DIAGNOSIS — Z7901 Long term (current) use of anticoagulants: Secondary | ICD-10-CM

## 2023-07-22 DIAGNOSIS — I959 Hypotension, unspecified: Secondary | ICD-10-CM

## 2023-07-22 DIAGNOSIS — E039 Hypothyroidism, unspecified: Secondary | ICD-10-CM | POA: Diagnosis not present

## 2023-07-22 DIAGNOSIS — H9190 Unspecified hearing loss, unspecified ear: Secondary | ICD-10-CM

## 2023-07-22 DIAGNOSIS — J449 Chronic obstructive pulmonary disease, unspecified: Secondary | ICD-10-CM | POA: Diagnosis not present

## 2023-07-22 DIAGNOSIS — Z85118 Personal history of other malignant neoplasm of bronchus and lung: Secondary | ICD-10-CM | POA: Insufficient documentation

## 2023-07-22 DIAGNOSIS — R918 Other nonspecific abnormal finding of lung field: Secondary | ICD-10-CM | POA: Diagnosis not present

## 2023-07-22 DIAGNOSIS — R55 Syncope and collapse: Secondary | ICD-10-CM

## 2023-07-22 DIAGNOSIS — R6889 Other general symptoms and signs: Secondary | ICD-10-CM | POA: Diagnosis not present

## 2023-07-22 DIAGNOSIS — A419 Sepsis, unspecified organism: Principal | ICD-10-CM | POA: Diagnosis present

## 2023-07-22 DIAGNOSIS — R1310 Dysphagia, unspecified: Secondary | ICD-10-CM | POA: Insufficient documentation

## 2023-07-22 DIAGNOSIS — J189 Pneumonia, unspecified organism: Secondary | ICD-10-CM

## 2023-07-22 DIAGNOSIS — F411 Generalized anxiety disorder: Secondary | ICD-10-CM | POA: Diagnosis not present

## 2023-07-22 DIAGNOSIS — R000 Tachycardia, unspecified: Secondary | ICD-10-CM | POA: Diagnosis not present

## 2023-07-22 DIAGNOSIS — R42 Dizziness and giddiness: Secondary | ICD-10-CM

## 2023-07-22 DIAGNOSIS — I749 Embolism and thrombosis of unspecified artery: Secondary | ICD-10-CM

## 2023-07-22 DIAGNOSIS — I700 Atherosclerosis of aorta: Secondary | ICD-10-CM | POA: Diagnosis not present

## 2023-07-22 DIAGNOSIS — R404 Transient alteration of awareness: Secondary | ICD-10-CM | POA: Diagnosis not present

## 2023-07-22 LAB — TROPONIN I (HIGH SENSITIVITY)
Troponin I (High Sensitivity): 15 ng/L (ref <18)
Troponin I (High Sensitivity): 17 ng/L (ref <18)

## 2023-07-22 LAB — URINALYSIS, W/ REFLEX TO CULTURE (INFECTION SUSPECTED)
Bilirubin Urine: NEGATIVE
Glucose, UA: NEGATIVE mg/dL
Ketones, ur: NEGATIVE mg/dL
Nitrite: NEGATIVE
Protein, ur: 100 mg/dL — AB
RBC / HPF: >50 RBC/hpf (ref 0–5)
Specific Gravity, Urine: 1.016 (ref 1.005–1.030)
pH: 6 (ref 5.0–8.0)

## 2023-07-22 LAB — CBC WITH DIFFERENTIAL/PLATELET
Abs Immature Granulocytes: 0.06 10*3/uL (ref 0.00–0.07)
Basophils Absolute: 0.1 10*3/uL (ref 0.0–0.1)
Basophils Relative: 1 %
Eosinophils Absolute: 0.1 10*3/uL (ref 0.0–0.5)
Eosinophils Relative: 1 %
HCT: 35 % — ABNORMAL LOW (ref 36.0–46.0)
Hemoglobin: 10.9 g/dL — ABNORMAL LOW (ref 12.0–15.0)
Immature Granulocytes: 1 %
Lymphocytes Relative: 15 %
Lymphs Abs: 1.3 10*3/uL (ref 0.7–4.0)
MCH: 30.2 pg (ref 26.0–34.0)
MCHC: 31.1 g/dL (ref 30.0–36.0)
MCV: 97 fL (ref 80.0–100.0)
Monocytes Absolute: 0.6 10*3/uL (ref 0.1–1.0)
Monocytes Relative: 7 %
Neutro Abs: 6.5 10*3/uL (ref 1.7–7.7)
Neutrophils Relative %: 75 %
Platelets: 340 10*3/uL (ref 150–400)
RBC: 3.61 MIL/uL — ABNORMAL LOW (ref 3.87–5.11)
RDW: 17.3 % — ABNORMAL HIGH (ref 11.5–15.5)
WBC: 8.6 10*3/uL (ref 4.0–10.5)
nRBC: 0 % (ref 0.0–0.2)

## 2023-07-22 LAB — COMPREHENSIVE METABOLIC PANEL WITH GFR
ALT: 18 U/L (ref 0–44)
AST: 28 U/L (ref 15–41)
Albumin: 1.5 g/dL — ABNORMAL LOW (ref 3.5–5.0)
Alkaline Phosphatase: 126 U/L (ref 38–126)
Anion gap: 9 (ref 5–15)
BUN: 21 mg/dL (ref 8–23)
CO2: 23 mmol/L (ref 22–32)
Calcium: 8.1 mg/dL — ABNORMAL LOW (ref 8.9–10.3)
Chloride: 107 mmol/L (ref 98–111)
Creatinine, Ser: 1.15 mg/dL — ABNORMAL HIGH (ref 0.44–1.00)
GFR, Estimated: 48 mL/min — ABNORMAL LOW (ref >60)
Glucose, Bld: 167 mg/dL — ABNORMAL HIGH (ref 70–99)
Potassium: 4.9 mmol/L (ref 3.5–5.1)
Sodium: 139 mmol/L (ref 135–145)
Total Bilirubin: 0.6 mg/dL (ref 0.0–1.2)
Total Protein: 5.6 g/dL — ABNORMAL LOW (ref 6.5–8.1)

## 2023-07-22 LAB — LACTIC ACID, PLASMA
Lactic Acid, Venous: 5 mmol/L (ref 0.5–1.9)
Lactic Acid, Venous: 6.6 mmol/L (ref 0.5–1.9)

## 2023-07-22 LAB — GLUCOSE, CAPILLARY: Glucose-Capillary: 106 mg/dL — ABNORMAL HIGH (ref 70–99)

## 2023-07-22 MED ORDER — ACETAMINOPHEN 325 MG PO TABS
650.0000 mg | ORAL_TABLET | Freq: Four times a day (QID) | ORAL | Status: DC | PRN
Start: 2023-07-22 — End: 2023-07-24
  Administered 2023-07-24: 650 mg via ORAL
  Filled 2023-07-22: qty 2

## 2023-07-22 MED ORDER — ONDANSETRON HCL 4 MG PO TABS: 4.0000 mg | ORAL_TABLET | Freq: Four times a day (QID) | ORAL | Status: DC | PRN

## 2023-07-22 MED ORDER — VANCOMYCIN HCL IN DEXTROSE 1-5 GM/200ML-% IV SOLN
1000.0000 mg | Freq: Once | INTRAVENOUS | Status: AC
  Administered 2023-07-22: 1000 mg via INTRAVENOUS
  Filled 2023-07-22: qty 200

## 2023-07-22 MED ORDER — LACTATED RINGERS IV BOLUS (SEPSIS)
1000.0000 mL | Freq: Once | INTRAVENOUS | Status: AC
  Administered 2023-07-22: 1000 mL via INTRAVENOUS

## 2023-07-22 MED ORDER — SODIUM CHLORIDE 0.9 % IV SOLN
2.0000 g | Freq: Once | INTRAVENOUS | Status: AC
  Administered 2023-07-22: 2 g via INTRAVENOUS
  Filled 2023-07-22: qty 12.5

## 2023-07-22 MED ORDER — HYDROCODONE-ACETAMINOPHEN 5-325 MG PO TABS: 1.0000 | ORAL_TABLET | ORAL | Status: DC | PRN

## 2023-07-22 MED ORDER — HYDROCODONE-ACETAMINOPHEN 7.5-325 MG PO TABS: 1.0000 | ORAL_TABLET | ORAL | Status: DC | PRN

## 2023-07-22 MED ORDER — ACETAMINOPHEN 650 MG RE SUPP: 650.0000 mg | Freq: Four times a day (QID) | RECTAL | Status: DC | PRN

## 2023-07-22 MED ORDER — LACTATED RINGERS IV BOLUS
250.0000 mL | Freq: Once | INTRAVENOUS | Status: AC
  Administered 2023-07-22: 250 mL via INTRAVENOUS

## 2023-07-22 MED ORDER — VANCOMYCIN HCL 750 MG/150ML IV SOLN
750.0000 mg | INTRAVENOUS | Status: DC
  Administered 2023-07-22: 750 mg via INTRAVENOUS
  Filled 2023-07-22 (×2): qty 150

## 2023-07-22 MED ORDER — SODIUM CHLORIDE 0.9 % IV SOLN
2.0000 g | Freq: Two times a day (BID) | INTRAVENOUS | Status: DC
  Administered 2023-07-23: 2 g via INTRAVENOUS
  Filled 2023-07-22 (×2): qty 12.5

## 2023-07-22 MED ORDER — SENNOSIDES-DOCUSATE SODIUM 8.6-50 MG PO TABS: 1.0000 | ORAL_TABLET | Freq: Every evening | ORAL | Status: DC | PRN

## 2023-07-22 MED ORDER — APIXABAN 5 MG PO TABS
5.0000 mg | ORAL_TABLET | Freq: Two times a day (BID) | ORAL | Status: DC
  Administered 2023-07-22 – 2023-07-24 (×4): 5 mg via ORAL
  Filled 2023-07-22 (×4): qty 1

## 2023-07-22 MED ORDER — CLONAZEPAM 1 MG PO TABS: 1.0000 mg | ORAL_TABLET | Freq: Two times a day (BID) | ORAL | Status: DC | PRN

## 2023-07-22 MED ORDER — ONDANSETRON HCL 4 MG/2ML IJ SOLN: 4.0000 mg | Freq: Four times a day (QID) | INTRAMUSCULAR | Status: DC | PRN

## 2023-07-22 MED ORDER — INSULIN ASPART 100 UNIT/ML IJ SOLN: 0.0000 [IU] | Freq: Every day | INTRAMUSCULAR | Status: DC

## 2023-07-22 MED ORDER — LACTATED RINGERS IV SOLN
150.0000 mL/h | INTRAVENOUS | Status: AC
  Administered 2023-07-22 – 2023-07-23 (×2): 150 mL/h via INTRAVENOUS

## 2023-07-22 MED ORDER — INSULIN ASPART 100 UNIT/ML IJ SOLN: 0.0000 [IU] | Freq: Three times a day (TID) | INTRAMUSCULAR | Status: DC

## 2023-07-22 MED ORDER — PANTOPRAZOLE SODIUM 40 MG PO TBEC
40.0000 mg | DELAYED_RELEASE_TABLET | Freq: Every day | ORAL | Status: DC
  Administered 2023-07-23 – 2023-07-24 (×2): 40 mg via ORAL
  Filled 2023-07-22 (×2): qty 1

## 2023-07-22 MED ORDER — BUDESON-GLYCOPYRROL-FORMOTEROL 160-9-4.8 MCG/ACT IN AERO
2.0000 | INHALATION_SPRAY | Freq: Every day | RESPIRATORY_TRACT | Status: DC
  Administered 2023-07-22 – 2023-07-23 (×2): 2 via RESPIRATORY_TRACT
  Filled 2023-07-22: qty 5.9

## 2023-07-22 MED ORDER — MAGNESIUM OXIDE -MG SUPPLEMENT 400 (240 MG) MG PO TABS
400.0000 mg | ORAL_TABLET | Freq: Three times a day (TID) | ORAL | Status: DC
  Administered 2023-07-22 – 2023-07-24 (×5): 400 mg via ORAL
  Filled 2023-07-22 (×5): qty 1

## 2023-07-22 MED ORDER — MENTHOL 3 MG MT LOZG
1.0000 | LOZENGE | OROMUCOSAL | Status: DC | PRN
  Filled 2023-07-22: qty 9

## 2023-07-22 MED ORDER — ALBUTEROL SULFATE (2.5 MG/3ML) 0.083% IN NEBU
2.5000 mg | INHALATION_SOLUTION | RESPIRATORY_TRACT | Status: DC | PRN
  Administered 2023-07-23 – 2023-07-24 (×2): 2.5 mg via RESPIRATORY_TRACT
  Filled 2023-07-22 (×2): qty 3

## 2023-07-22 NOTE — Assessment & Plan Note (Signed)
 Increased nursing assistance for communication of needs

## 2023-07-22 NOTE — ED Notes (Signed)
 Lab called to draw second set of cultures after this RN was unsuccessful

## 2023-07-22 NOTE — ED Provider Notes (Signed)
 [facility] Provider Note    Event Date/Time   First MD Initiated Contact with Patient 07/22/23 1630     (approximate)   History   Hypotension   HPI  Lynn Robbins is a 83 y.o. female with history of CHF, COPD, diabetes, hypertension, and lung cancer status postradiation who presents with lightheadedness and low blood pressure.  Per EMS, the patient was feeling dizzy while getting her hair done.  When they arrived, her blood pressure was 70/50.  Currently, the patient denies feeling lightheaded anymore.  She denies any acute pain.  She denies any nausea or vomiting.  She has no difficulty breathing.  She states that she has not been feeling weak.  She has not eaten anything since breakfast.  I reviewed the past medical records.  The patient was most recently seen in the ED on 4/25 with abnormal abdominal x-rays; workup showed colitis.  The patient did not require inpatient admission.  Her most recent outpatient counter was with [facility] on 3/24 for management of her COPD.   Physical Exam   Triage Vital Signs: ED Triage Vitals  Encounter Vitals Group     BP --      Systolic BP Percentile --      Diastolic BP Percentile --      Pulse Rate 07/22/23 1636 (!) 113     Resp 07/22/23 1636 (!) 24     Temp --      Temp src --      SpO2 07/22/23 1636 100 %     Weight --      Height 07/22/23 1631 5\' 2"  (1.575 m)     Head Circumference --      Peak Flow --      Pain Score 07/22/23 1631 0     Pain Loc --      Pain Education --      Exclude from Growth Chart --     Most recent vital signs: Vitals:   07/22/23 2040 07/22/23 2324  BP: 118/71 107/78  Pulse: 74 (!) 107  Resp: 18 18  Temp: 98.2 F (36.8 C) 98.1 F (36.7 C)  SpO2: 97% 94%    General: Alert and oriented, no distress.  CV:  Good peripheral perfusion.  Lungs CTAB. Resp:  Normal effort.  Abd:  No distention.  Soft with no focal tenderness. Other:  Slightly dry mucous membranes.   EOMI.  PERRLA.  Normal speech.  Motor intact in all extremities.   ED Results / Procedures / Treatments   Labs (all labs ordered are listed, but only abnormal results are displayed) Labs Reviewed  LACTIC ACID, PLASMA - Abnormal; Notable for the following components:      Result Value   Lactic Acid, Venous 5.0 (*)    All other components within normal limits  LACTIC ACID, PLASMA - Abnormal; Notable for the following components:   Lactic Acid, Venous 6.6 (*)    All other components within normal limits  COMPREHENSIVE METABOLIC PANEL WITH GFR - Abnormal; Notable for the following components:   Glucose, Bld 167 (*)    Creatinine, Ser 1.15 (*)    Calcium 8.1 (*)    Total Protein 5.6 (*)    Albumin 1.5 (*)    GFR, Estimated 48 (*)    All other components within normal limits  CBC WITH DIFFERENTIAL/PLATELET - Abnormal; Notable for the following components:   RBC 3.61 (*)    Hemoglobin 10.9 (*)  HCT 35.0 (*)    RDW 17.3 (*)    All other components within normal limits  URINALYSIS, W/ REFLEX TO CULTURE (INFECTION SUSPECTED) - Abnormal; Notable for the following components:   Color, Urine YELLOW (*)    APPearance CLOUDY (*)    Hgb urine dipstick MODERATE (*)    Protein, ur 100 (*)    Leukocytes,Ua TRACE (*)    Bacteria, UA RARE (*)    All other components within normal limits  GLUCOSE, CAPILLARY - Abnormal; Notable for the following components:   Glucose-Capillary 106 (*)    All other components within normal limits  CULTURE, BLOOD (ROUTINE X 2)  CULTURE, BLOOD (ROUTINE X 2)  BASIC METABOLIC PANEL WITH GFR  CBC  HEMOGLOBIN A1C  LACTIC ACID, PLASMA  LACTIC ACID, PLASMA  TROPONIN I (HIGH SENSITIVITY)  TROPONIN I (HIGH SENSITIVITY)     EKG  ED ECG REPORT I, Lind Repine, the attending physician, personally viewed and interpreted this ECG.  Date: 07/22/2023 EKG Time: 1636 Rate: 114 Rhythm: Tachycardia, possibly junctional versus atrial fibrillation QRS Axis:  normal Intervals: normal ST/T Wave abnormalities: Nonspecific ST abnormalities Narrative Interpretation: no evidence of acute ischemia    RADIOLOGY  Chest x-ray: I independently viewed and interpreted the images; there is a right perihilar opacity concerning for pneumonia   PROCEDURES:  Critical Care performed: Yes, see critical care procedure note(s)  .Critical Care  Performed by: Lind Repine, MD Authorized by: Lind Repine, MD   Critical care provider statement:    Critical care time (minutes):  30   Critical care time was exclusive of:  Separately billable procedures and treating other patients   Critical care was necessary to treat or prevent imminent or life-threatening deterioration of the following conditions:  Shock and sepsis   Critical care was time spent personally by me on the following activities:  Development of treatment plan with patient or surrogate, discussions with consultants, evaluation of patient's response to treatment, examination of patient, ordering and review of laboratory studies, ordering and review of radiographic studies, ordering and performing treatments and interventions, pulse oximetry, re-evaluation of patient's condition, review of old charts and obtaining history from patient or surrogate   Care discussed with: admitting provider      MEDICATIONS ORDERED IN ED: Medications  lactated ringers  infusion (150 mL/hr Intravenous New Bag/Given 07/22/23 2251)  acetaminophen  (TYLENOL ) tablet 650 mg (has no administration in time range)    Or  acetaminophen  (TYLENOL ) suppository 650 mg (has no administration in time range)  HYDROcodone -acetaminophen  (NORCO/VICODIN) 5-325 MG per tablet 1-2 tablet (has no administration in time range)  senna-docusate (Senokot-S) tablet 1 tablet (has no administration in time range)  ondansetron  (ZOFRAN ) tablet 4 mg (has no administration in time range)    Or  ondansetron  (ZOFRAN ) injection 4 mg (has no  administration in time range)  albuterol  (PROVENTIL ) (2.5 MG/3ML) 0.083% nebulizer solution 2.5 mg (has no administration in time range)  ceFEPIme  (MAXIPIME ) 2 g in sodium chloride  0.9 % 100 mL IVPB (has no administration in time range)  vancomycin  (VANCOREADY) IVPB 750 mg/150 mL (750 mg Intravenous New Bag/Given 07/22/23 2342)  insulin  aspart (novoLOG ) injection 0-15 Units (has no administration in time range)  insulin  aspart (novoLOG ) injection 0-5 Units ( Subcutaneous Not Given 07/22/23 2326)  apixaban  (ELIQUIS ) tablet 5 mg (5 mg Oral Given 07/22/23 2249)  budesonide-glycopyrrolate-formoterol (BREZTRI) 160-9-4.8 MCG/ACT inhaler 2 puff (2 puffs Inhalation Given 07/22/23 2250)  clonazePAM  (KLONOPIN ) tablet 1 mg (has no administration in time range)  HYDROcodone -acetaminophen  (NORCO) 7.5-325 MG per tablet 1 tablet (has no administration in time range)  magnesium oxide (MAG-OX) tablet 400 mg (400 mg Oral Given 07/22/23 2249)  pantoprazole  (PROTONIX ) EC tablet 40 mg (has no administration in time range)  lactated ringers  bolus 1,000 mL (0 mLs Intravenous Stopped 07/22/23 1805)    And  lactated ringers  bolus 1,000 mL (0 mLs Intravenous Stopped 07/22/23 1942)  lactated ringers  bolus 250 mL (250 mLs Intravenous New Bag/Given 07/22/23 1930)  ceFEPIme  (MAXIPIME ) 2 g in sodium chloride  0.9 % 100 mL IVPB (0 g Intravenous Stopped 07/22/23 1919)  vancomycin  (VANCOCIN ) IVPB 1000 mg/200 mL premix (0 mg Intravenous Stopped 07/22/23 2310)     IMPRESSION / MDM / ASSESSMENT AND PLAN / ED COURSE  I reviewed the triage vital signs and the nursing notes.  83 year old female with PMH as noted above presents with dizziness while getting her hair done, associated with hypotension.  On exam in the ED the patient remains quite hypotensive as well as tachycardic to the 110s.  Other vital signs are normal.  However, the patient denies any acute symptoms right now, states she no longer feels dizzy, and does not appear  toxic.  Differential diagnosis includes, but is not limited to, acute infection/sepsis, dehydration, hypovolemia, metabolic disturbance, less likely cardiac etiology.  We will give fluids, obtain lab workup per the sepsis protocol, and reassess.  Although sepsis is on the differential, the patient is not having any specific symptoms to suggest an acute infection so we will hold off on empiric antibiotics until more of the workup is completed.  Patient's presentation is most consistent with acute presentation with potential threat to life or bodily function.  The patient is on the cardiac monitor to evaluate for evidence of arrhythmia and/or significant heart rate changes.  ----------------------------------------- 7:41 PM on 07/22/2023 -----------------------------------------   Chest x-ray shows a possible opacity.  Urinalysis also is concerning for UTI.  Lactate is significantly elevated.  CBC shows no leukocytosis.  Sepsis reassessment: Repeat lactate is still elevated after fluids.  The patient is finishing her 30 mL/kg bolus.  Blood pressure rapidly improved after the fluid bolus was started.  The patient remains hemodynamically stable.  Empiric antibiotics have been ordered.  I consulted Dr. Vallarie Gauze from the hospitalist service; based on our discussion she agrees to evaluate the patient for admission.    FINAL CLINICAL IMPRESSION(S) / ED DIAGNOSES   Final diagnoses:  Sepsis, due to unspecified organism, unspecified whether acute organ dysfunction present [facility])     Rx / DC Orders   ED Discharge Orders     None        Note:  This document was prepared using Dragon voice recognition software and may include unintentional dictation errors.    Lind Repine, MD 07/22/23 [phone]

## 2023-07-22 NOTE — Assessment & Plan Note (Signed)
 Chronic respiratory failure Not acutely exacerbated Continue home inhalers with DuoNebs as needed Continue supplemental oxygen 

## 2023-07-22 NOTE — ED Notes (Signed)
 Pt cleaned up with cleansing wipes after brief was found wet. New bedpads and brief applied

## 2023-07-22 NOTE — Consult Note (Addendum)
 Pharmacy Antibiotic Note  Lynn Robbins is a 83 y.o. female admitted on 07/22/2023 with sepsis.  Pharmacy has been consulted for cefepime  and vancomycin  dosing. Afeb, lactic acid 6.6, WBC 8.6.   Plan: Will start cefepime  2 g q12H. Used 74 kg.   Will give total of vancomycin  1750 mg x 1 loading dose (1000 mg + 750 mg today) followed by 750 mg q24H.   Height: 5\' 2"  (157.5 cm) IBW/kg (Calculated) : 50.1  Temp (24hrs), Avg:98.3 F (36.8 C), Min:98.3 F (36.8 C), Max:98.3 F (36.8 C)  Recent Labs  Lab 07/22/23 1637 07/22/23 1749  WBC 8.6  --   CREATININE 1.15*  --   LATICACIDVEN 5.0* 6.6*    CrCl cannot be calculated (Unknown ideal weight.).    No Known Allergies  Antimicrobials this admission: 6/6/ cefepime  >>  6/6 vancomycin  >>   Dose adjustments this admission: None  Microbiology results: 6/6 BCx: pending   Thank you for allowing pharmacy to be a part of this patient's care.  Trinidad Funk, PharmD, BCPS 07/22/2023 7:49 PM

## 2023-07-22 NOTE — Assessment & Plan Note (Signed)
 Sliding scale insulin coverage

## 2023-07-22 NOTE — Assessment & Plan Note (Signed)
 Chronic anticoagulation Continue Eliquis  Acute complication not suspected at this time

## 2023-07-22 NOTE — ED Triage Notes (Addendum)
 Pt arrives via [facility] for feeling dizzy while getting her heair done. Pts BP was 70/50. EMS gave 50 mL of NS. Pt is on blood thinners and is normally pale. Hard of hearing. Pt ANOx3 on arrival  EMS vitals: 118 HR 116 CBG 97.79F

## 2023-07-22 NOTE — ED Notes (Addendum)
 This RN and phlebotomist unable to obtain second set of blood cultures. ED provider made aware and said it was okay to start first antibiotic at [facility]

## 2023-07-22 NOTE — Assessment & Plan Note (Addendum)
 SIRS, possible severe sepsis Possible pneumonia, possible postobstructive/aspiration Possible UTI Sore throat with odynophagia Patient was hypotensive and tachycardic and tachypneic on arrival with normal WBC but first lactic 6.6 Continue with IV fluid resuscitation Antibiotics of cefepime  and vancomycin  to cover both possible pneumonia and UTI Will get strep test, respiratory viral panel Cepacol lozenges, as needed Magic mouthwash Close hemodynamic monitoring Follow blood cultures

## 2023-07-22 NOTE — Assessment & Plan Note (Signed)
 Suspected cancer although never biopsied or treated-has followed with oncology Last seen by pulmonology on 04/2023-and prior PET scan Can consider pulmonology consult felt while inpatient

## 2023-07-22 NOTE — Assessment & Plan Note (Signed)
 Continue home Klonopin.

## 2023-07-22 NOTE — Assessment & Plan Note (Signed)
 Suspecting related to volume depletion Hydrate and monitor and avoid nephrotoxins

## 2023-07-22 NOTE — Assessment & Plan Note (Signed)
 Continue levothyroxine 

## 2023-07-22 NOTE — ED Notes (Signed)
 Pt is a high fall risk, non skid socks placed, bed alarm is on and pt has a fall risk bracelet at this time.

## 2023-07-22 NOTE — Assessment & Plan Note (Signed)
 Hypotension Etiology uncertain, possible vagovagal possible early severe sepsis Improved on its own with IV fluids Will treat initially as sepsis as outlined on the respective problem Orthostatic vital checks Neurologic checks with fall and aspiration precautions

## 2023-07-22 NOTE — Assessment & Plan Note (Signed)
 Holding home atenolol  and losartan  HCT due to hypotension

## 2023-07-22 NOTE — H&P (Signed)
 History and Physical    Patient: Lynn Robbins ZOX:[id number] DOB: 03-17-1940 DOA: 07/22/2023 DOS: the patient was seen and examined on 07/22/2023 PCP: Geraline Knapp, NP  Patient coming from: ALF/ILF  Chief Complaint:  Chief Complaint  Patient presents with   Hypotension    HPI: Lynn Robbins is a 83 y.o. 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  Patient was in her usual state of health and was getting her haircut at the facility when she became lightheaded.  She stated she felt lightheaded and she heard one of her friends saying "don't leave me", and the next thing she remembered was waking up in her room.  She now feels back to her baseline, however she does complain of a sore throat, painful swallowing and a painful raw tongue for the past 10 days or so.  They have been treating her with lozenges without effect.   Of note, patient did have an ED visit 4/25 with workup showing colitis that did not require inpatient admission.  She currently has no vomiting or diarrhea.  On EMS arrival BP was 70/50. ED course and data review,: Upon arrival tachycardic to 113 with BP 60/52 and respirations 24 with O2 sat at 100% on room air.  Her blood pressure was fluid responsive to 109/76 by admission.  She was afebrile. Labs notable for normal WBC of 8.6 but with lactic acid 6.6,Hemoglobin 10.9, which is her baseline.  Creatinine was 1.15 which is up from her baseline of 0.93 a month ago.  Urinalysis mostly unremarkable with cloudy appearance to urine trace leukocytes and rare bacteria. EKG, personally viewed and interpreted  showing junctional tachycardia at 114 with nonspecific ST-T wave changes  Chest x-ray showed possible pneumonia as follows: IMPRESSION: 1. Suspected ill-defined opacity in the right perihilar region, may represent pneumonia in the appropriate clinical setting. 2. Peripheral opacity in the left upper lobe is unchanged from prior PET, likely post treatment related change  Patient treated with sepsis fluids with movement and blood pressure and tachycardia she was started on cefepime  and vancomycin  for suspected pneumonia versus UTI.  Hospitalist consulted for admission.     Review of Systems: As mentioned in the history of present illness. All other systems reviewed and are negative.  Past Medical History:  Diagnosis Date   Cancer (HCC)    CHF (congestive heart failure) (HCC)    COPD (chronic obstructive pulmonary disease) (HCC)    Depression    Diabetes mellitus without complication (HCC)    HOH (hard of hearing)    Hypertension    Past Surgical History:  Procedure Laterality Date   CESAREAN SECTION     Social History:  reports that she quit smoking about 25 years ago. Her smoking use included cigarettes. She started smoking about 82 years ago. She has a 171 pack-year smoking history. Her smokeless tobacco use includes snuff. She reports that she does not drink alcohol and does not use drugs.  No Known Allergies  Family History  Problem Relation Age of Onset   Colon cancer Mother    Aneurysm Mother 24       brain   Diabetes Father    Colon cancer Father  Lung cancer Sister    Breast cancer Sister    Lung cancer Sister    Cancer Sister        type unknown   Lung cancer Brother    Bone cancer Brother    Lung cancer Brother    Early death Daughter    Heart disease Daughter     Prior to Admission medications   Medication Sig Start Date End Date Taking? Authorizing Provider  albuterol  (VENTOLIN  HFA) 108 (90 Base) MCG/ACT inhaler Inhale 2 puffs into the lungs every 4  (four) hours as needed for wheezing or shortness of breath. 05/09/23  Yes Assaker, Marianne Shirts, MD  apixaban  (ELIQUIS ) 5 MG TABS tablet Take 1 tablet (5 mg total) by mouth 2 (two) times daily. 04/12/22  Yes Luna Salinas, MD  atenolol  (TENORMIN ) 25 MG tablet Take 1 tablet (25 mg total) by mouth daily. 09/15/21  Yes Abernathy, Janeice Medal, NP  BREZTRI  AEROSPHERE 160-9-4.8 MCG/ACT AERO Inhale 2 puffs into the lungs in the morning and at bedtime. 03/23/23  Yes [provider]  clonazePAM  (KLONOPIN ) 1 MG tablet Take 1 tablet (1 mg total) by mouth 2 (two) times daily as needed. for anxiety 02/05/22  Yes Abernathy, Alyssa, NP  HYDROcodone -acetaminophen  (NORCO) 7.5-325 MG tablet TAKE ONE TABLET BY MOUTH TWICE DAILY AS NEEDED FOR MODERATE OR SEVERE PAIN 04/13/22  Yes Amin, Sumayya, MD  JANUMET  50-500 MG tablet Take 1 tablet by mouth daily. 07/21/23  Yes [provider]  losartan -hydrochlorothiazide  (HYZAAR ) 50-12.5 MG tablet TAKE 1 TABLET BY MOUTH ONCE DAILY 10/16/21  Yes Abernathy, Alyssa, NP  magnesium  oxide (MAG-OX) 400 (240 Mg) MG tablet Take 400 mg by mouth in the morning, at noon, and at bedtime.   Yes [provider]  pantoprazole  (PROTONIX ) 40 MG tablet Take 40 mg by mouth daily. 07/21/23  Yes [provider]  Accu-Chek FastClix Lancets MISC Use as directed to check sugars once daily. DX E11.65 01/19/21   Laurence Pons, NP  glucose blood (ACCU-CHEK GUIDE) test strip Use as instructed to check blood sugars once daily DX: E11.65 01/19/21   Abernathy, Alyssa, NP  meclizine (ANTIVERT) 12.5 MG tablet Take 12.5 mg by mouth 3 (three) times daily as needed for dizziness. Patient not taking: Reported on 07/22/2023    [provider]  OXYGEN  Inhale into the lungs.    [provider]  STIOLTO RESPIMAT 2.5-2.5 MCG/ACT AERS Inhale 2 puffs into the lungs daily. Patient not taking: Reported on 05/09/2023 03/02/23   [provider]    Physical Exam: Vitals:   07/22/23  1705 07/22/23 1830 07/22/23 1900 07/22/23 2040  BP:  (!) 124/108 109/76 118/71  Pulse:  75 76 74  Resp:  14 (!) 21 18  Temp: 98.3 F (36.8 C)   98.2 F (36.8 C)  TempSrc: Oral     SpO2:  100% 98% 97%  Height:       Physical Exam Vitals and nursing note reviewed.  Constitutional:      General: She is not in acute distress. HENT:     Head: Normocephalic and atraumatic.     Mouth/Throat:     Comments: Tongue appears red.  No thrush Cardiovascular:     Rate and Rhythm: Normal rate and regular rhythm.     Heart sounds: Normal heart sounds.  Pulmonary:     Effort: Pulmonary effort is normal.     Breath sounds: Normal breath sounds.  Abdominal:     Palpations: Abdomen is soft.  Tenderness: There is no abdominal tenderness.  Neurological:     Mental Status: Mental status is at baseline.     Labs on Admission: I have personally reviewed following labs and imaging studies  CBC: Recent Labs  Lab 07/22/23 1637  WBC 8.6  NEUTROABS 6.5  HGB 10.9*  HCT 35.0*  MCV 97.0  PLT 340   Basic Metabolic Panel: Recent Labs  Lab 07/22/23 1637  NA 139  K 4.9  CL 107  CO2 23  GLUCOSE 167*  BUN 21  CREATININE 1.15*  CALCIUM 8.1*   GFR: CrCl cannot be calculated (Unknown ideal weight.). Liver Function Tests: Recent Labs  Lab 07/22/23 1637  AST 28  ALT 18  ALKPHOS 126  BILITOT 0.6  PROT 5.6*  ALBUMIN 1.5*   No results for input(s): "LIPASE", "AMYLASE" in the last 168 hours. No results for input(s): "AMMONIA" in the last 168 hours. Coagulation Profile: No results for input(s): "INR", "PROTIME" in the last 168 hours. Cardiac Enzymes: No results for input(s): "CKTOTAL", "CKMB", "CKMBINDEX", "TROPONINI" in the last 168 hours. BNP (last 3 results) No results for input(s): "PROBNP" in the last 8760 hours. HbA1C: No results for input(s): "HGBA1C" in the last 72 hours. CBG: No results for input(s): "GLUCAP" in the last 168 hours. Lipid Profile: No results for  input(s): "CHOL", "HDL", "LDLCALC", "TRIG", "CHOLHDL", "LDLDIRECT" in the last 72 hours. Thyroid  Function Tests: No results for input(s): "TSH", "T4TOTAL", "FREET4", "T3FREE", "THYROIDAB" in the last 72 hours. Anemia Panel: No results for input(s): "VITAMINB12", "FOLATE", "FERRITIN", "TIBC", "IRON", "RETICCTPCT" in the last 72 hours. Urine analysis:    Component Value Date/Time   COLORURINE YELLOW (A) 07/22/2023 1806   APPEARANCEUR CLOUDY (A) 07/22/2023 1806   APPEARANCEUR Turbid (A) 01/19/2021 1140   LABSPEC 1.016 07/22/2023 1806   PHURINE 6.0 07/22/2023 1806   GLUCOSEU NEGATIVE 07/22/2023 1806   HGBUR MODERATE (A) 07/22/2023 1806   BILIRUBINUR NEGATIVE 07/22/2023 1806   BILIRUBINUR Negative 01/19/2021 1140   KETONESUR NEGATIVE 07/22/2023 1806   PROTEINUR 100 (A) 07/22/2023 1806   NITRITE NEGATIVE 07/22/2023 1806   LEUKOCYTESUR TRACE (A) 07/22/2023 1806    Radiological Exams on Admission: DG Chest Port 1 View Result Date: 07/22/2023 CLINICAL DATA:  Sepsis.  Dizziness. EXAM: PORTABLE CHEST 1 VIEW COMPARISON:  Radiograph 04/10/2018.  PET CT 05/11/2023 FINDINGS: Stable heart size and mediastinal contours. Aortic atherosclerosis. Peripheral opacity in the left upper lobe is unchanged from prior PET, likely post treatment related change. No pulmonary edema, large pleural effusion or pneumothorax. Skin folds project over the right hemithorax. Suspected ill-defined opacity in the right perihilar region. IMPRESSION: 1. Suspected ill-defined opacity in the right perihilar region, may represent pneumonia in the appropriate clinical setting. 2. Peripheral opacity in the left upper lobe is unchanged from prior PET, likely post treatment related change. Electronically Signed   By: Chadwick Colonel M.D.   On: 07/22/2023 17:22   Data Reviewed for HPI: Relevant notes from primary care and specialist visits, past discharge summaries as available in EHR, including Care Everywhere. Prior diagnostic testing  as pertinent to current admission diagnoses Updated medications and problem lists for reconciliation ED course, including vitals, labs, imaging, treatment and response to treatment Triage notes, nursing and pharmacy notes and ED provider's notes Notable results as noted above in HPI      Assessment and Plan: * Postural dizziness with presyncope Hypotension Etiology uncertain, possible vagovagal possible early severe sepsis Improved on its own with IV fluids Will treat initially as sepsis  as outlined on the respective problem Orthostatic vital checks Neurologic checks with fall and aspiration precautions  Lactic acidosis Secondary to severe sepsis versus tissue hypoxia from hypotension We will treat her sepsis for now until ruled out  Severe sepsis (HCC) SIRS, possible severe sepsis Possible pneumonia, possible postobstructive/aspiration Possible UTI Sore throat with odynophagia Patient was hypotensive and tachycardic and tachypneic on arrival with normal WBC but first lactic 6.6 Continue with IV fluid resuscitation Antibiotics of cefepime  and vancomycin  to cover both possible pneumonia and UTI Will get strep test, respiratory viral panel Cepacol lozenges, as needed Magic mouthwash Close hemodynamic monitoring Follow blood cultures   Mass of upper lobe of left lung Suspected cancer although never biopsied or treated-has followed with oncology Last seen by pulmonology on 04/2023-and prior PET scan Can consider pulmonology consult felt while inpatient  Chronic thromboembolic right pulmonary artery (HCC) Chronic anticoagulation Continue Eliquis  Acute complication not suspected at this time  AKI (acute kidney injury) (HCC) Suspecting related to volume depletion Hydrate and monitor and avoid nephrotoxins  Hypertension Holding home atenolol  and losartan  HCT due to hypotension  COPD (chronic obstructive pulmonary disease) (HCC) Chronic respiratory failure Not acutely  exacerbated Continue home inhalers with DuoNebs as needed Continue supplemental oxygen   Hypothyroidism Continue levothyroxine  Diabetes mellitus without complication (HCC) Sliding scale insulin  coverage  Generalized anxiety disorder Continue home Klonopin   HOH (hard of hearing) Increased nursing assistance for communication of needs        DVT prophylaxis: Apixaban   Consults: none  Advance Care Planning:   Code Status: Limited: Do not attempt resuscitation (DNR) -DNR-LIMITED -Do Not Intubate/DNI    Family Communication: none  Disposition Plan: Back to previous home environment  Severity of Illness: The appropriate patient status for this patient is OBSERVATION. Observation status is judged to be reasonable and necessary in order to provide the required intensity of service to ensure the patient's safety. The patient's presenting symptoms, physical exam findings, and initial radiographic and laboratory data in the context of their medical condition is felt to place them at decreased risk for further clinical deterioration. Furthermore, it is anticipated that the patient will be medically stable for discharge from the hospital within 2 midnights of admission.   Author: Lanetta Pion, MD 07/22/2023 9:01 PM  For on call review [email or web address].

## 2023-07-23 DIAGNOSIS — E872 Acidosis, unspecified: Secondary | ICD-10-CM | POA: Diagnosis not present

## 2023-07-23 DIAGNOSIS — R42 Dizziness and giddiness: Secondary | ICD-10-CM | POA: Diagnosis not present

## 2023-07-23 DIAGNOSIS — R55 Syncope and collapse: Secondary | ICD-10-CM | POA: Diagnosis not present

## 2023-07-23 DIAGNOSIS — N3000 Acute cystitis without hematuria: Secondary | ICD-10-CM | POA: Diagnosis not present

## 2023-07-23 DIAGNOSIS — I959 Hypotension, unspecified: Secondary | ICD-10-CM | POA: Diagnosis not present

## 2023-07-23 DIAGNOSIS — A419 Sepsis, unspecified organism: Secondary | ICD-10-CM | POA: Diagnosis not present

## 2023-07-23 DIAGNOSIS — J449 Chronic obstructive pulmonary disease, unspecified: Secondary | ICD-10-CM | POA: Diagnosis not present

## 2023-07-23 DIAGNOSIS — H9190 Unspecified hearing loss, unspecified ear: Secondary | ICD-10-CM | POA: Diagnosis not present

## 2023-07-23 LAB — BLOOD CULTURE ID PANEL (REFLEXED) - BCID2

## 2023-07-23 LAB — BASIC METABOLIC PANEL WITH GFR
Anion gap: 3 — ABNORMAL LOW (ref 5–15)
BUN: 22 mg/dL (ref 8–23)
CO2: 27 mmol/L (ref 22–32)
Calcium: 7.7 mg/dL — ABNORMAL LOW (ref 8.9–10.3)
Chloride: 108 mmol/L (ref 98–111)
Creatinine, Ser: 1.01 mg/dL — ABNORMAL HIGH (ref 0.44–1.00)
GFR, Estimated: 56 mL/min — ABNORMAL LOW (ref >60)
Glucose, Bld: 113 mg/dL — ABNORMAL HIGH (ref 70–99)
Potassium: 4.1 mmol/L (ref 3.5–5.1)
Sodium: 138 mmol/L (ref 135–145)

## 2023-07-23 LAB — GLUCOSE, CAPILLARY
Glucose-Capillary: 105 mg/dL — ABNORMAL HIGH (ref 70–99)
Glucose-Capillary: 94 mg/dL (ref 70–99)
Glucose-Capillary: 96 mg/dL (ref 70–99)
Glucose-Capillary: 153 mg/dL — ABNORMAL HIGH (ref 70–99)

## 2023-07-23 LAB — CBC
HCT: 28.4 % — ABNORMAL LOW (ref 36.0–46.0)
Hemoglobin: 9.2 g/dL — ABNORMAL LOW (ref 12.0–15.0)
MCH: 30.1 pg (ref 26.0–34.0)
MCHC: 32.4 g/dL (ref 30.0–36.0)
MCV: 92.8 fL (ref 80.0–100.0)
Platelets: 279 10*3/uL (ref 150–400)
RBC: 3.06 MIL/uL — ABNORMAL LOW (ref 3.87–5.11)
RDW: 17.5 % — ABNORMAL HIGH (ref 11.5–15.5)
WBC: 6 10*3/uL (ref 4.0–10.5)
nRBC: 0 % (ref 0.0–0.2)

## 2023-07-23 LAB — RESP PANEL BY RT-PCR (RSV, FLU A&B, COVID)  RVPGX2
Influenza A by PCR: NEGATIVE
Influenza B by PCR: NEGATIVE
Resp Syncytial Virus by PCR: NEGATIVE
SARS Coronavirus 2 by RT PCR: NEGATIVE

## 2023-07-23 LAB — MRSA NEXT GEN BY PCR, NASAL: MRSA by PCR Next Gen: NOT DETECTED

## 2023-07-23 LAB — LACTIC ACID, PLASMA
Lactic Acid, Venous: 2.8 mmol/L (ref 0.5–1.9)
Lactic Acid, Venous: 1.8 mmol/L (ref 0.5–1.9)

## 2023-07-23 MED ORDER — SODIUM CHLORIDE 0.9 % IV SOLN
1.0000 g | INTRAVENOUS | Status: DC
  Administered 2023-07-23: 1 g via INTRAVENOUS
  Filled 2023-07-23 (×2): qty 10

## 2023-07-23 MED ORDER — HYDROCODONE-ACETAMINOPHEN 5-325 MG PO TABS: 1.0000 | ORAL_TABLET | Freq: Four times a day (QID) | ORAL | Status: DC | PRN

## 2023-07-23 MED ORDER — MAGIC MOUTHWASH
10.0000 mL | Freq: Four times a day (QID) | ORAL | Status: DC | PRN
  Filled 2023-07-23: qty 10

## 2023-07-23 MED ORDER — SODIUM CHLORIDE 0.9 % IV BOLUS
500.0000 mL | Freq: Once | INTRAVENOUS | Status: AC
  Administered 2023-07-23: 500 mL via INTRAVENOUS

## 2023-07-23 NOTE — Plan of Care (Signed)
°  Problem: Fluid Volume: °Goal: Hemodynamic stability will improve °Outcome: Progressing °  °Problem: Clinical Measurements: °Goal: Diagnostic test results will improve °Outcome: Progressing °Goal: Signs and symptoms of infection will decrease °Outcome: Progressing °  °

## 2023-07-23 NOTE — Progress Notes (Signed)
 1      PROGRESS NOTE    Lynn Robbins  RUE:[id number] DOB: 1940/05/29 DOA: 07/22/2023 PCP: Geraline Knapp, NP    Brief Narrative:   83 y.o. 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 , who was sent from her assisted living facility with lightheadedness and hypotension, presyncope versus syncope   6/7: PT, OT, palliative care consult   Assessment & Plan:   Principal Problem:   Postural dizziness with presyncope Active Problems:   Hypotension   Severe sepsis (HCC)   Lactic acidosis   Pneumonia   Urinary tract infection   Mass of upper lobe of left lung   Hypertension   AKI (acute kidney injury) (HCC)   Chronic thromboembolic right pulmonary artery (HCC)   Chronic anticoagulation   Hypothyroidism   COPD (chronic obstructive pulmonary disease) (HCC)   Chronic respiratory failure (HCC)   Diabetes mellitus without complication (HCC)   Generalized anxiety disorder   HOH (hard of hearing)  * Postural dizziness with presyncope Hypotension Likely vasovagal due to hypotension Improved with IV hydration Will treat initially as sepsis as outlined on the respective problem Orthostatic vital checks Neurologic checks with fall and aspiration precautions   Lactic acidosis Secondary to tissue hypoxia from hypotension, could be also from sepsis Lactic acid normalized from 6.6-2.8-1.8   Severe sepsis (HCC) ruled out Sepsis due to UTI based on UA Pneumonia ruled out Sore throat with odynophagia Patient was hypotensive and tachycardic and tachypneic on arrival with normal WBC but first lactic 6.6.  This has normalized with IV hydration and antibiotics Discontinue cefepime  and vancomycin  which was initiated on  admission Will switch her to IV Rocephin  for possible UTI Influenza and COVID test negative.  Will check respiratory panel Cepacol lozenges, as needed Magic mouthwash Close hemodynamic monitoring Pending blood cultures   Mass of upper lobe of left lung Suspected cancer although never biopsied or treated-has followed with oncology Last seen by pulmonology on 04/2023-and prior PET scan Can consider pulmonology consult felt while inpatient   Chronic thromboembolic right pulmonary artery (HCC) Chronic anticoagulation Continue Eliquis  Acute complication not suspected at this time   AKI (acute kidney injury) (HCC) Suspecting related to volume depletion Hydrate and monitor and avoid nephrotoxins   Hypertension Holding home atenolol  and losartan  HCT due to hypotension   COPD (chronic obstructive pulmonary disease) (HCC) Chronic respiratory failure Not acutely exacerbated Continue home inhalers with DuoNebs as needed Continue supplemental oxygen    Hypothyroidism Continue levothyroxine   Diabetes mellitus without complication (HCC) Sliding scale insulin  coverage   Generalized anxiety disorder Continue home Klonopin    HOH (hard of hearing) Increased nursing assistance for communication of needs  Weakness Will obtain PT and OT evaluation.  Consult palliative care for goals of care   DVT prophylaxis: (Eliquis   apixaban  (ELIQUIS ) tablet 5 mg     Code Status: DNR Family Communication: None at bedside Disposition Plan: Possible discharge in next 1 to 2 days depending on clinical condition   Antimicrobials:  Rocephin     Subjective:  Hard of Hearing, no fever, denies any new issues, requesting Albuterol   Objective: Vitals:   07/23/23 0420 07/23/23 0646 07/23/23 0842 07/23/23 1251  BP: (!) 100/50 119/78 109/81 129/67  Pulse: 70 98 (!) 109 77  Resp:   20 18  Temp:   99.1 F (37.3 C) 98.9 F (37.2 C)  TempSrc:      SpO2:   97% 94%  Height:        Intake/Output  Summary (Last 24 hours) at 07/23/2023 1349 Last data filed at 07/23/2023 0655 Gross per 24 hour  Intake 1937.71 ml  Output --  Net 1937.71 ml   There were no vitals filed for this visit.  Examination:  General exam: Appears calm and comfortable, hard of hearing Respiratory system: Clear to auscultation. Respiratory effort normal. Cardiovascular system: S1 & S2 heard, RRR. No murmurs. No pedal edema. Gastrointestinal system: Abdomen is soft, benign Central nervous system: Alert and awake. No focal neurological deficits. Extremities: Symmetric 5 x 5 power. Skin: No rashes, lesions or ulcers Psychiatry: Judgement and insight appear normal. Mood & affect appropriate.     Data Reviewed: I have personally reviewed following labs and imaging studies  CBC: Recent Labs  Lab 07/22/23 1637 07/23/23 0806  WBC 8.6 6.0  NEUTROABS 6.5  --   HGB 10.9* 9.2*  HCT 35.0* 28.4*  MCV 97.0 92.8  PLT 340 279   Basic Metabolic Panel: Recent Labs  Lab 07/22/23 1637 07/23/23 0806  NA 139 138  K 4.9 4.1  CL 107 108  CO2 23 27  GLUCOSE 167* 113*  BUN 21 22  CREATININE 1.15* 1.01*  CALCIUM 8.1* 7.7*   GFR: CrCl cannot be calculated (Unknown ideal weight.). Liver Function Tests: Recent Labs  Lab 07/22/23 1637  AST 28  ALT 18  ALKPHOS 126  BILITOT 0.6  PROT 5.6*  ALBUMIN 1.5*    CBG: Recent Labs  Lab 07/22/23 2326 07/23/23 0845 07/23/23 1253  GLUCAP 106* 105* 94    Sepsis Labs: Recent Labs  Lab 07/22/23 1637 07/22/23 1749 07/22/23 2317 07/23/23 0136  LATICACIDVEN 5.0* 6.6* 2.8* 1.8    Recent Results (from the past 240 hours)  Resp panel by RT-PCR (RSV, Flu A&B, Covid) Anterior Nasal Swab     Status: None   Collection Time: 07/22/23  4:18 AM   Specimen: Anterior Nasal Swab  Result Value Ref Range Status   SARS Coronavirus 2 by RT PCR NEGATIVE NEGATIVE Final    Comment: (NOTE) SARS-CoV-2 target nucleic acids are NOT DETECTED.  The SARS-CoV-2 RNA is generally  detectable in upper respiratory specimens during the acute phase of infection. The lowest concentration of SARS-CoV-2 viral copies this assay can detect is 138 copies/mL. A negative result does not preclude SARS-Cov-2 infection and should not be used as the sole basis for treatment or other patient management decisions. A negative result may occur with  improper specimen collection/handling, submission of specimen other than nasopharyngeal swab, presence of viral mutation(s) within the areas targeted by this assay, and inadequate number of viral copies(<138 copies/mL). A negative result must be combined with clinical observations, patient history, and epidemiological information. The expected result is Negative.  Fact Sheet for Patients:  BloggerCourse.com  Fact Sheet for Healthcare Providers:  SeriousBroker.it  This test is no t yet approved or cleared by the [place]  FDA and  has been authorized for detection and/or diagnosis of SARS-CoV-2 by FDA under an Emergency Use Authorization (EUA). This EUA will remain  in effect (meaning this test can be used) for the duration of the COVID-19 declaration under Section 564(b)(1) of the Act, 21 U.S.C.section 360bbb-3(b)(1), unless the authorization is terminated  or revoked sooner.       Influenza A by PCR NEGATIVE NEGATIVE Final  Influenza B by PCR NEGATIVE NEGATIVE Final    Comment: (NOTE) The Xpert Xpress SARS-CoV-2/FLU/RSV plus assay is intended as an aid in the diagnosis of influenza from Nasopharyngeal swab specimens and should not be used as a sole basis for treatment. Nasal washings and aspirates are unacceptable for Xpert Xpress SARS-CoV-2/FLU/RSV testing.  Fact Sheet for Patients: BloggerCourse.com  Fact Sheet for Healthcare Providers: SeriousBroker.it  This test is not yet approved or cleared by the [place]  FDA  and has been authorized for detection and/or diagnosis of SARS-CoV-2 by FDA under an Emergency Use Authorization (EUA). This EUA will remain in effect (meaning this test can be used) for the duration of the COVID-19 declaration under Section 564(b)(1) of the Act, 21 U.S.C. section 360bbb-3(b)(1), unless the authorization is terminated or revoked.     Resp Syncytial Virus by PCR NEGATIVE NEGATIVE Final    Comment: (NOTE) Fact Sheet for Patients: BloggerCourse.com  Fact Sheet for Healthcare Providers: SeriousBroker.it  This test is not yet approved or cleared by the [place]  FDA and has been authorized for detection and/or diagnosis of SARS-CoV-2 by FDA under an Emergency Use Authorization (EUA). This EUA will remain in effect (meaning this test can be used) for the duration of the COVID-19 declaration under Section 564(b)(1) of the Act, 21 U.S.C. section 360bbb-3(b)(1), unless the authorization is terminated or revoked.  Performed at [facility], [address] [place], [place] [address]   Blood Culture (routine x 2)     Status: None (Preliminary result)   Collection Time: 07/22/23  4:37 PM   Specimen: Right Antecubital; Blood  Result Value Ref Range Status   Specimen Description RIGHT ANTECUBITAL  Final   Special Requests   Final    BOTTLES DRAWN AEROBIC AND ANAEROBIC Blood Culture adequate volume   Culture   Final    NO GROWTH < 12 HOURS Performed at [facility], [address] [place], [place] [address]    Report Status PENDING  Incomplete         Radiology Studies: DG Chest Port 1 View Result Date: 07/22/2023 CLINICAL DATA:  Sepsis.  Dizziness. EXAM: PORTABLE CHEST 1 VIEW COMPARISON:  Radiograph 04/10/2018.  PET CT 05/11/2023 FINDINGS: Stable heart size and mediastinal contours. Aortic atherosclerosis. Peripheral opacity in the left upper lobe is unchanged from prior PET, likely post  treatment related change. No pulmonary edema, large pleural effusion or pneumothorax. Skin folds project over the right hemithorax. Suspected ill-defined opacity in the right perihilar region. IMPRESSION: 1. Suspected ill-defined opacity in the right perihilar region, may represent pneumonia in the appropriate clinical setting. 2. Peripheral opacity in the left upper lobe is unchanged from prior PET, likely post treatment related change. Electronically Signed   By: Chadwick Colonel M.D.   On: 07/22/2023 17:22        Scheduled Meds:  apixaban   5 mg Oral BID   budesonide -glycopyrrolate -formoterol   2 puff Inhalation QHS   insulin  aspart  0-15 Units Subcutaneous TID WC   insulin  aspart  0-5 Units Subcutaneous QHS   magnesium  oxide  400 mg Oral TID   pantoprazole   40 mg Oral Daily   Continuous Infusions:  ceFEPime  (MAXIPIME ) IV 2 g (07/23/23 0511)   lactated ringers  150 mL/hr (07/23/23 0316)   vancomycin  750 mg (07/22/23 2342)     LOS: 0 days    Time spent: 35 mins    Brentin Shin Mason Sole, MD [facility] Pager [phone]  If 7PM-7AM, please contact night-coverage [email or web address]  07/23/2023,  1:49 PM

## 2023-07-23 NOTE — Assessment & Plan Note (Addendum)
 Secondary to severe sepsis versus tissue hypoxia from hypotension We will treat her sepsis for now until ruled out

## 2023-07-23 NOTE — Progress Notes (Signed)
 PHARMACY - PHYSICIAN COMMUNICATION CRITICAL VALUE ALERT - BLOOD CULTURE IDENTIFICATION (BCID)  Lynn Robbins is an 83 y.o. female who presented to [facility] from their assisted living facility on 07/22/2023 with a chief complaint of of lightheadedness and hypotension. Initially treated for sepsis and then switched to current antibiotics due to possible UTI  Assessment: 6/6 Bcx; 1/4 Gram positive cocci, Staphylococcus epidermidis, suspected contaminant  Name of physician (or Provider) Contacted: Dr. Mason Sole  Current antibiotics: Ceftriaxone  1g IV daily   Changes to prescribed antibiotics recommended:  Suspected contaminant. Will continue with current antibiotic plan with no changes   Results for orders placed or performed during the hospital encounter of 07/22/23  Blood Culture ID Panel (Reflexed) (Collected: 07/22/2023  4:37 PM)  Result Value Ref Range   Enterococcus faecalis NOT DETECTED NOT DETECTED   Enterococcus Faecium NOT DETECTED NOT DETECTED   Listeria monocytogenes NOT DETECTED NOT DETECTED   Staphylococcus species DETECTED (A) NOT DETECTED   Staphylococcus aureus (BCID) NOT DETECTED NOT DETECTED   Staphylococcus epidermidis DETECTED (A) NOT DETECTED   Staphylococcus lugdunensis NOT DETECTED NOT DETECTED   Streptococcus species NOT DETECTED NOT DETECTED   Streptococcus agalactiae NOT DETECTED NOT DETECTED   Streptococcus pneumoniae NOT DETECTED NOT DETECTED   Streptococcus pyogenes NOT DETECTED NOT DETECTED   A.calcoaceticus-baumannii NOT DETECTED NOT DETECTED   Bacteroides fragilis NOT DETECTED NOT DETECTED   Enterobacterales NOT DETECTED NOT DETECTED   Enterobacter cloacae complex NOT DETECTED NOT DETECTED   Escherichia coli NOT DETECTED NOT DETECTED   Klebsiella aerogenes NOT DETECTED NOT DETECTED   Klebsiella oxytoca NOT DETECTED NOT DETECTED   Klebsiella pneumoniae NOT DETECTED NOT DETECTED   Proteus species NOT DETECTED NOT DETECTED   Salmonella species NOT DETECTED  NOT DETECTED   Serratia marcescens NOT DETECTED NOT DETECTED   Haemophilus influenzae NOT DETECTED NOT DETECTED   Neisseria meningitidis NOT DETECTED NOT DETECTED   Pseudomonas aeruginosa NOT DETECTED NOT DETECTED   Stenotrophomonas maltophilia NOT DETECTED NOT DETECTED   Candida albicans NOT DETECTED NOT DETECTED   Candida auris NOT DETECTED NOT DETECTED   Candida glabrata NOT DETECTED NOT DETECTED   Candida krusei NOT DETECTED NOT DETECTED   Candida parapsilosis NOT DETECTED NOT DETECTED   Candida tropicalis NOT DETECTED NOT DETECTED   Cryptococcus neoformans/gattii NOT DETECTED NOT DETECTED   Methicillin resistance mecA/C DETECTED (A) NOT DETECTED   Thank you for allowing pharmacy to participate in this patient's care.  Craven Do, PharmD Pharmacy Resident  07/23/2023 5:17 PM

## 2023-07-24 DIAGNOSIS — Z743 Need for continuous supervision: Secondary | ICD-10-CM | POA: Diagnosis not present

## 2023-07-24 DIAGNOSIS — H9190 Unspecified hearing loss, unspecified ear: Secondary | ICD-10-CM | POA: Diagnosis not present

## 2023-07-24 DIAGNOSIS — N3000 Acute cystitis without hematuria: Secondary | ICD-10-CM | POA: Diagnosis not present

## 2023-07-24 DIAGNOSIS — A419 Sepsis, unspecified organism: Secondary | ICD-10-CM | POA: Diagnosis not present

## 2023-07-24 DIAGNOSIS — J449 Chronic obstructive pulmonary disease, unspecified: Secondary | ICD-10-CM

## 2023-07-24 DIAGNOSIS — R6889 Other general symptoms and signs: Secondary | ICD-10-CM | POA: Diagnosis not present

## 2023-07-24 DIAGNOSIS — Z7401 Bed confinement status: Secondary | ICD-10-CM | POA: Diagnosis not present

## 2023-07-24 DIAGNOSIS — R55 Syncope and collapse: Secondary | ICD-10-CM | POA: Diagnosis not present

## 2023-07-24 DIAGNOSIS — I959 Hypotension, unspecified: Secondary | ICD-10-CM | POA: Diagnosis not present

## 2023-07-24 DIAGNOSIS — R42 Dizziness and giddiness: Secondary | ICD-10-CM | POA: Diagnosis not present

## 2023-07-24 LAB — RESPIRATORY PANEL BY PCR

## 2023-07-24 LAB — GLUCOSE, CAPILLARY
Glucose-Capillary: 87 mg/dL (ref 70–99)
Glucose-Capillary: 159 mg/dL — ABNORMAL HIGH (ref 70–99)

## 2023-07-24 LAB — CBC
HCT: 27.8 % — ABNORMAL LOW (ref 36.0–46.0)
Hemoglobin: 9.2 g/dL — ABNORMAL LOW (ref 12.0–15.0)
MCH: 30.7 pg (ref 26.0–34.0)
MCHC: 33.1 g/dL (ref 30.0–36.0)
MCV: 92.7 fL (ref 80.0–100.0)
Platelets: 272 10*3/uL (ref 150–400)
RBC: 3 MIL/uL — ABNORMAL LOW (ref 3.87–5.11)
RDW: 17.2 % — ABNORMAL HIGH (ref 11.5–15.5)
WBC: 5.1 10*3/uL (ref 4.0–10.5)
nRBC: 0 % (ref 0.0–0.2)

## 2023-07-24 LAB — BASIC METABOLIC PANEL WITH GFR
Anion gap: 6 (ref 5–15)
BUN: 18 mg/dL (ref 8–23)
CO2: 23 mmol/L (ref 22–32)
Calcium: 7.7 mg/dL — ABNORMAL LOW (ref 8.9–10.3)
Chloride: 108 mmol/L (ref 98–111)
Creatinine, Ser: 0.85 mg/dL (ref 0.44–1.00)
GFR, Estimated: >60 mL/min (ref >60)
Glucose, Bld: 84 mg/dL (ref 70–99)
Potassium: 3.6 mmol/L (ref 3.5–5.1)
Sodium: 137 mmol/L (ref 135–145)

## 2023-07-24 MED ORDER — CIPROFLOXACIN HCL 500 MG PO TABS: 500.0000 mg | ORAL_TABLET | Freq: Two times a day (BID) | ORAL | Status: AC

## 2023-07-24 MED ORDER — MENTHOL 3 MG MT LOZG: 1.0000 | LOZENGE | OROMUCOSAL | Status: DC | PRN

## 2023-07-24 MED ORDER — MAGIC MOUTHWASH: 10.0000 mL | Freq: Four times a day (QID) | ORAL | Status: DC | PRN

## 2023-07-24 NOTE — Discharge Summary (Signed)
 Physician Discharge Summary   Patient: Lynn Robbins MRN: [id number] DOB: April 14, 1940  Admit date:     07/22/2023  Discharge date: 07/24/23  Discharge Physician: Brenna Cam   PCP: Geraline Knapp, NP   Recommendations at discharge:    F/up with outpatient providers as requested  Discharge Diagnoses: Principal Problem:   Postural dizziness with presyncope Active Problems:   Hypotension   Severe sepsis (HCC)   Lactic acidosis   Pneumonia   Urinary tract infection   Mass of upper lobe of left lung   Hypertension   AKI (acute kidney injury) (HCC)   Chronic thromboembolic right pulmonary artery (HCC)   Chronic anticoagulation   Hypothyroidism   COPD (chronic obstructive pulmonary disease) (HCC)   Chronic respiratory failure (HCC)   Diabetes mellitus without complication (HCC)   Generalized anxiety disorder   HOH (hard of hearing)  Hospital Course: Assessment and Plan:  83 y.o. 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  Treated Pneumonia ruled out Sore throat with odynophagia Patient was hypotensive and tachycardic and tachypneic on arrival with normal WBC but first lactic 6.6.  This has normalized with IV hydration and antibiotics Initially treated  with cefepime  and vancomycin .  Switched to IV Rocephin  and being discharged on oral Cipro .  She has remained afebrile.  This may be mild UTI Influenza and COVID test negative.  respiratory panel negative Cepacol lozenges, as needed Magic mouthwash 6/6 Bcx; 1/4 Gram positive cocci, Staphylococcus epidermidis, suspected contaminant    Mass of upper lobe of left lung Suspected cancer although never biopsied or treated-has followed with oncology Last seen by pulmonology on 04/2023-and prior PET scan Outpatient pulmonary and oncology follow-up   Chronic thromboembolic right pulmonary artery (HCC) Chronic anticoagulation Continue Eliquis    AKI (acute kidney injury) (HCC) Resolved with IV hydration   Hypertension Continue home medications   COPD (chronic obstructive pulmonary disease) (HCC) Chronic respiratory failure Not acutely exacerbated   Hypothyroidism Continue levothyroxine   Diabetes mellitus without complication (HCC)    Generalized anxiety disorder Continue home Klonopin    HOH (hard of hearing) Increased nursing assistance for communication of needs   Weakness Evaluated by PT and OT.  She is at baseline per their evaluation.  She uses hoyer lift for transfers to w/c           Disposition: Long term care facility with home health PT, OT and palliative care Diet recommendation:  Carb modified diet DISCHARGE MEDICATION: Allergies as of 07/24/2023   No Known Allergies      Medication List     STOP taking these medications    meclizine 12.5 MG tablet Commonly known as: ANTIVERT   Stiolto Respimat 2.5-2.5 MCG/ACT Aers Generic drug: Tiotropium Bromide -Olodaterol       TAKE these medications    Accu-Chek FastClix Lancets Misc Use as directed to check sugars once  daily. DX E11.65   Accu-Chek Guide test strip Generic drug: glucose blood Use as instructed to check blood sugars once daily DX: E11.65   albuterol  108 (90 Base) MCG/ACT inhaler Commonly  known as: VENTOLIN  HFA Inhale 2 puffs into the lungs every 4 (four) hours as needed for wheezing or shortness of breath.   apixaban  5 MG Tabs tablet Commonly known as: ELIQUIS  Take 1 tablet (5 mg total) by mouth 2 (two) times daily.   atenolol  25 MG tablet Commonly known as: TENORMIN  Take 1 tablet (25 mg total) by mouth daily.   Breztri  Aerosphere 160-9-4.8 MCG/ACT Aero inhaler Generic drug: budesonide -glycopyrrolate -formoterol  Inhale 2 puffs into the lungs in the morning and at bedtime.   ciprofloxacin  500 MG tablet Commonly known as: Cipro  Take 1 tablet (500 mg total) by mouth 2 (two) times daily for 3 days.   clonazePAM  1 MG tablet Commonly known as: KLONOPIN  Take 1 tablet (1 mg total) by mouth 2 (two) times daily as needed. for anxiety   HYDROcodone -acetaminophen  7.5-325 MG tablet Commonly known as: NORCO TAKE ONE TABLET BY MOUTH TWICE DAILY AS NEEDED FOR MODERATE OR SEVERE PAIN   Janumet  50-500 MG tablet Generic drug: sitaGLIPtin -metformin  Take 1 tablet by mouth daily.   losartan -hydrochlorothiazide  50-12.5 MG tablet Commonly known as: HYZAAR  TAKE 1 TABLET BY MOUTH ONCE DAILY   magic mouthwash Soln Take 10 mLs by mouth 4 (four) times daily as needed for mouth pain. Suspension contains equal amounts of Maalox Extra Strength, nystatin, and diphenhydramine.   magnesium  oxide 400 (240 Mg) MG tablet Commonly known as: MAG-OX Take 400 mg by mouth in the morning, at noon, and at bedtime.   menthol -cetylpyridinium 3 MG lozenge Commonly known as: CEPACOL Take 1 lozenge (3 mg total) by mouth as needed for sore throat.   OXYGEN  Inhale into the lungs.   pantoprazole  40 MG tablet Commonly known as: PROTONIX  Take 40 mg by mouth daily.        Discharge Exam: There were no vitals filed for this visit. General exam: Appears calm and comfortable, hard of hearing Respiratory system: Clear to auscultation. Respiratory effort normal. Cardiovascular system: S1 & S2 heard,  RRR. No murmurs. No pedal edema. Gastrointestinal system: Abdomen is soft, benign Central nervous system: Alert and awake. No focal neurological deficits. Extremities: Symmetric 5 x 5 power. Skin: No rashes, lesions or ulcers Psychiatry: Judgement and insight appear normal. Mood & affect appropriate.   Condition at discharge: fair  The results of significant diagnostics from this hospitalization (including imaging, microbiology, ancillary and laboratory) are listed below for reference.   Imaging Studies: DG Chest Port 1 View Result Date: 07/22/2023 CLINICAL DATA:  Sepsis.  Dizziness. EXAM: PORTABLE CHEST 1 VIEW COMPARISON:  Radiograph 04/10/2018.  PET CT 05/11/2023 FINDINGS: Stable heart size and mediastinal contours. Aortic atherosclerosis. Peripheral opacity in the left upper lobe is unchanged from prior PET, likely post treatment related change. No pulmonary edema, large pleural effusion or pneumothorax. Skin folds project over the right hemithorax. Suspected ill-defined opacity in the right perihilar region. IMPRESSION: 1. Suspected ill-defined opacity in the right perihilar region, may represent pneumonia in the appropriate clinical setting. 2. Peripheral opacity in the left upper lobe is unchanged from prior PET, likely post treatment related change. Electronically Signed   By: Chadwick Colonel M.D.   On: 07/22/2023 17:22    Microbiology: Results for orders placed or performed during the hospital encounter of 07/22/23  Resp panel by RT-PCR (RSV, Flu A&B, Covid) Anterior Nasal Swab  Status: None   Collection Time: 07/22/23  4:18 AM   Specimen: Anterior Nasal Swab  Result Value Ref Range Status   SARS Coronavirus 2 by RT PCR NEGATIVE NEGATIVE Final    Comment: (NOTE) SARS-CoV-2 target nucleic acids are NOT DETECTED.  The SARS-CoV-2 RNA is generally detectable in upper respiratory specimens during the acute phase of infection. The lowest concentration of SARS-CoV-2 viral copies this  assay can detect is 138 copies/mL. A negative result does not preclude SARS-Cov-2 infection and should not be used as the sole basis for treatment or other patient management decisions. A negative result may occur with  improper specimen collection/handling, submission of specimen other than nasopharyngeal swab, presence of viral mutation(s) within the areas targeted by this assay, and inadequate number of viral copies(<138 copies/mL). A negative result must be combined with clinical observations, patient history, and epidemiological information. The expected result is Negative.  Fact Sheet for Patients:  BloggerCourse.com  Fact Sheet for Healthcare Providers:  SeriousBroker.it  This test is no t yet approved or cleared by the [place]  FDA and  has been authorized for detection and/or diagnosis of SARS-CoV-2 by FDA under an Emergency Use Authorization (EUA). This EUA will remain  in effect (meaning this test can be used) for the duration of the COVID-19 declaration under Section 564(b)(1) of the Act, 21 U.S.C.section 360bbb-3(b)(1), unless the authorization is terminated  or revoked sooner.       Influenza A by PCR NEGATIVE NEGATIVE Final   Influenza B by PCR NEGATIVE NEGATIVE Final    Comment: (NOTE) The Xpert Xpress SARS-CoV-2/FLU/RSV plus assay is intended as an aid in the diagnosis of influenza from Nasopharyngeal swab specimens and should not be used as a sole basis for treatment. Nasal washings and aspirates are unacceptable for Xpert Xpress SARS-CoV-2/FLU/RSV testing.  Fact Sheet for Patients: BloggerCourse.com  Fact Sheet for Healthcare Providers: SeriousBroker.it  This test is not yet approved or cleared by the [place]  FDA and has been authorized for detection and/or diagnosis of SARS-CoV-2 by FDA under an Emergency Use Authorization (EUA). This EUA will  remain in effect (meaning this test can be used) for the duration of the COVID-19 declaration under Section 564(b)(1) of the Act, 21 U.S.C. section 360bbb-3(b)(1), unless the authorization is terminated or revoked.     Resp Syncytial Virus by PCR NEGATIVE NEGATIVE Final    Comment: (NOTE) Fact Sheet for Patients: BloggerCourse.com  Fact Sheet for Healthcare Providers: SeriousBroker.it  This test is not yet approved or cleared by the [place]  FDA and has been authorized for detection and/or diagnosis of SARS-CoV-2 by FDA under an Emergency Use Authorization (EUA). This EUA will remain in effect (meaning this test can be used) for the duration of the COVID-19 declaration under Section 564(b)(1) of the Act, 21 U.S.C. section 360bbb-3(b)(1), unless the authorization is terminated or revoked.  Performed at [facility], [address] [place], [place] [address]   Blood Culture (routine x 2)     Status: None (Preliminary result)   Collection Time: 07/22/23  4:37 PM   Specimen: Right Antecubital; Blood  Result Value Ref Range Status   Specimen Description   Final    RIGHT ANTECUBITAL Performed at [facility], [address] [place], [place] [address]    Special Requests   Final    BOTTLES DRAWN AEROBIC AND ANAEROBIC Blood Culture adequate volume Performed at [facility], [address], [place], [place] [address]    Culture  Setup  Time   Final    Organism ID to follow GRAM POSITIVE COCCI ANAEROBIC BOTTLE ONLY CRITICAL RESULT CALLED TO, READ BACK BY AND VERIFIED WITH: ANDREA DOBBS AT 1626 07/23/23.PMF    Culture   Final    GRAM POSITIVE COCCI TOO YOUNG TO READ Performed at [facility], [address] [place], [place] [address]    Report Status PENDING  Incomplete  Blood Culture ID Panel (Reflexed)     Status: Abnormal   Collection Time: 07/22/23  4:37 PM  Result Value Ref Range  Status   Enterococcus faecalis NOT DETECTED NOT DETECTED Final   Enterococcus Faecium NOT DETECTED NOT DETECTED Final   Listeria monocytogenes NOT DETECTED NOT DETECTED Final   Staphylococcus species DETECTED (A) NOT DETECTED Final    Comment: CRITICAL RESULT CALLED TO, READ BACK BY AND VERIFIED WITH: ANDREA DOBBS AT 1626 07/23/23.PMF    Staphylococcus aureus (BCID) NOT DETECTED NOT DETECTED Final   Staphylococcus epidermidis DETECTED (A) NOT DETECTED Final    Comment: Methicillin (oxacillin) resistant coagulase negative staphylococcus. Possible blood culture contaminant (unless isolated from more than one blood culture draw or clinical case suggests pathogenicity). No antibiotic treatment is indicated for blood  culture contaminants. CRITICAL RESULT CALLED TO, READ BACK BY AND VERIFIED WITH: ANDREA DOBBS AT 1626 07/23/23.PMF    Staphylococcus lugdunensis NOT DETECTED NOT DETECTED Final   Streptococcus species NOT DETECTED NOT DETECTED Final   Streptococcus agalactiae NOT DETECTED NOT DETECTED Final   Streptococcus pneumoniae NOT DETECTED NOT DETECTED Final   Streptococcus pyogenes NOT DETECTED NOT DETECTED Final   A.calcoaceticus-baumannii NOT DETECTED NOT DETECTED Final   Bacteroides fragilis NOT DETECTED NOT DETECTED Final   Enterobacterales NOT DETECTED NOT DETECTED Final   Enterobacter cloacae complex NOT DETECTED NOT DETECTED Final   Escherichia coli NOT DETECTED NOT DETECTED Final   Klebsiella aerogenes NOT DETECTED NOT DETECTED Final   Klebsiella oxytoca NOT DETECTED NOT DETECTED Final   Klebsiella pneumoniae NOT DETECTED NOT DETECTED Final   Proteus species NOT DETECTED NOT DETECTED Final   Salmonella species NOT DETECTED NOT DETECTED Final   Serratia marcescens NOT DETECTED NOT DETECTED Final   Haemophilus influenzae NOT DETECTED NOT DETECTED Final   Neisseria meningitidis NOT DETECTED NOT DETECTED Final   Pseudomonas aeruginosa NOT DETECTED NOT DETECTED Final    Stenotrophomonas maltophilia NOT DETECTED NOT DETECTED Final   Candida albicans NOT DETECTED NOT DETECTED Final   Candida auris NOT DETECTED NOT DETECTED Final   Candida glabrata NOT DETECTED NOT DETECTED Final   Candida krusei NOT DETECTED NOT DETECTED Final   Candida parapsilosis NOT DETECTED NOT DETECTED Final   Candida tropicalis NOT DETECTED NOT DETECTED Final   Cryptococcus neoformans/gattii NOT DETECTED NOT DETECTED Final   Methicillin resistance mecA/C DETECTED (A) NOT DETECTED Final    Comment: CRITICAL RESULT CALLED TO, READ BACK BY AND VERIFIED WITH: ANDREA DOBBS AT 1626 07/23/23.PMF Performed at [facility], [address] [place], [place] [address]   Blood Culture (routine x 2)     Status: None (Preliminary result)   Collection Time: 07/22/23  8:03 PM   Specimen: BLOOD  Result Value Ref Range Status   Specimen Description BLOOD BLOOD LEFT HAND  Final   Special Requests AEROBIC BOTTLE ONLY Blood Culture adequate volume  Final   Culture   Final    NO GROWTH 1 DAY Performed at [facility], [address] [place], [place] [address]    Report Status PENDING  Incomplete  MRSA Next Gen by PCR, Nasal     Status: None   Collection Time: 07/23/23  9:37 PM   Specimen: Nasal Mucosa; Nasal Swab  Result Value Ref Range Status   MRSA by PCR Next Gen NOT DETECTED NOT DETECTED Final    Comment: (NOTE) The GeneXpert MRSA Assay (FDA approved for NASAL specimens only), is one component of a comprehensive MRSA colonization surveillance program. It is not intended to diagnose MRSA infection nor to guide or monitor treatment for MRSA infections. Test performance is not FDA approved in patients less than 15 years old. Performed at [facility], [address] [place], [place] [address]   Respiratory (~20 pathogens) panel by PCR     Status: None   Collection Time: 07/23/23  9:39 PM   Specimen: Nasopharyngeal Swab; Respiratory  Result Value Ref Range  Status   Adenovirus NOT DETECTED NOT DETECTED Final   Coronavirus 229E NOT DETECTED NOT DETECTED Final    Comment: (NOTE) The Coronavirus on the Respiratory Panel, DOES NOT test for the novel  Coronavirus (2019 nCoV)    Coronavirus HKU1 NOT DETECTED NOT DETECTED Final   Coronavirus NL63 NOT DETECTED NOT DETECTED Final   Coronavirus OC43 NOT DETECTED NOT DETECTED Final   Metapneumovirus NOT DETECTED NOT DETECTED Final   Rhinovirus / Enterovirus NOT DETECTED NOT DETECTED Final   Influenza A NOT DETECTED NOT DETECTED Final   Influenza B NOT DETECTED NOT DETECTED Final   Parainfluenza Virus 1 NOT DETECTED NOT DETECTED Final   Parainfluenza Virus 2 NOT DETECTED NOT DETECTED Final   Parainfluenza Virus 3 NOT DETECTED NOT DETECTED Final   Parainfluenza Virus 4 NOT DETECTED NOT DETECTED Final   Respiratory Syncytial Virus NOT DETECTED NOT DETECTED Final   Bordetella pertussis NOT DETECTED NOT DETECTED Final   Bordetella Parapertussis NOT DETECTED NOT DETECTED Final   Chlamydophila pneumoniae NOT DETECTED NOT DETECTED Final   Mycoplasma pneumoniae NOT DETECTED NOT DETECTED Final    Comment: Performed at [facility], [address] [place], [place] [address]    Labs: CBC: Recent Labs  Lab 07/22/23 1637 07/23/23 0806 07/24/23 0809  WBC 8.6 6.0 5.1  NEUTROABS 6.5  --   --   HGB 10.9* 9.2* 9.2*  HCT 35.0* 28.4* 27.8*  MCV 97.0 92.8 92.7  PLT 340 279 272   Basic Metabolic Panel: Recent Labs  Lab 07/22/23 1637 07/23/23 0806 07/24/23 0809  NA 139 138 137  K 4.9 4.1 3.6  CL 107 108 108  CO2 23 27 23   GLUCOSE 167* 113* 84  BUN 21 22 18   CREATININE 1.15* 1.01* 0.85  CALCIUM 8.1* 7.7* 7.7*   Liver Function Tests: Recent Labs  Lab 07/22/23 1637  AST 28  ALT 18  ALKPHOS 126  BILITOT 0.6  PROT 5.6*  ALBUMIN 1.5*   CBG: Recent Labs  Lab 07/23/23 0845 07/23/23 1253 07/23/23 1717 07/23/23 2131 07/24/23 0757  GLUCAP 105* 94 96 153* 87    Discharge time spent:  greater than 30 minutes.  Signed: Brenna Cam, MD [facility] 07/24/2023

## 2023-07-24 NOTE — Evaluation (Signed)
 Occupational Therapy Evaluation Patient Details Name: Lynn Robbins MRN: [id number] DOB: 07-14-1940 Today's Date: 07/24/2023   History of Present Illness   Pt is a 83 y.o. female who was sent from her ALF with lightheadedness and hypotension, presyncope versus syncope. Admitted for management of above, sepsis, UTI and sore throat. 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     Clinical Impressions Pt was seen for OT evaluation this date. 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 She is mostly bedbound, reports she can sit at EOB and perform ADLs with set up assist for bathing and dressing. Has assist for toileting. Pt presents to acute OT performing at her baseline. Mod I with increased time and effort to reach EOB from semi-supine using bed rails. Supervision for seated balance. Able to doff socks at bed level with MOD I via figure four. Max A to scoot up to [facility]. Denies dizziness. Reports pain behind her eyes as she is having scheduled cataract surgery end of this month and was premedicated prior to session. No further OT needs and can have follow up therapy at facility at their discretion.      If plan is discharge home, recommend the following:   Two people to help with walking and/or transfers;A little help with bathing/dressing/bathroom     Functional Status Assessment   Patient has not had a recent decline in their functional status     Equipment Recommendations         Recommendations for Other Services         Precautions/Restrictions   Precautions Precautions: Fall Recall of Precautions/Restrictions: Intact Restrictions Weight Bearing Restrictions Per  Provider Order: No     Mobility Bed Mobility Overal bed mobility: Modified Independent             General bed mobility comments: able to get to EOB with HOB elevated and use of bed rails and return to supine; needed Max A to scoot up in bed which she reports is baseline for her    Transfers                   General transfer comment: deferred-hoyer lift at baseline      Balance Overall balance assessment: Needs assistance, Mild deficits observed, not formally tested Sitting-balance support: Feet supported Sitting balance-Leahy Scale: Fair                                     ADL either performed or assessed with clinical judgement   ADL Overall ADL's : At baseline                                       General ADL Comments: doffed socks in bed via figure four on her own     Vision         Perception         Praxis         Pertinent Vitals/Pain Pain Assessment Pain Assessment: Faces Faces Pain Scale: Hurts little more Pain Location: behind her eyes Pain  Intervention(s): Monitored during session, Premedicated before session, Repositioned, Limited activity within patient's tolerance     Extremity/Trunk Assessment Upper Extremity Assessment Upper Extremity Assessment: Generalized weakness   Lower Extremity Assessment Lower Extremity Assessment: Generalized weakness       Communication Communication Communication: Impaired Factors Affecting Communication: Hearing impaired   Cognition Arousal: Alert Behavior During Therapy: WFL for tasks assessed/performed Cognition: No apparent impairments             OT - Cognition Comments: alert and oriented x4                 Following commands: Intact       Cueing  General Comments          Exercises     Shoulder Instructions      Home Living Family/patient expects to be discharged to:: Skilled nursing facility [facility] LTC)                                  Additional Comments: pt reports she is a resident at [facility] under LTC      Prior Functioning/Environment Prior Level of Function : Needs assist       Physical Assist : Mobility (physical);ADLs (physical) Mobility (physical): Transfers ADLs (physical): Bathing Mobility Comments: reports being a hoyer lift transfer at baseline, but able to perform bed mobility with MOD I ADLs Comments: mostly IND once set up with items needed for bathing/dressing at bed level/EOB    OT Problem List:     OT Treatment/Interventions:        OT Goals(Current goals can be found in the care plan section)       OT Frequency:       Co-evaluation              AM-PAC OT "6 Clicks" Daily Activity     Outcome Measure Help from another person eating meals?: None Help from another person taking care of personal grooming?: A Little Help from another person toileting, which includes using toliet, bedpan, or urinal?: Total Help from another person bathing (including washing, rinsing, drying)?: A Little Help from another person to put on and taking off regular upper body clothing?: A Little Help from another person to put on and taking off regular lower body clothing?: A Little 6 Click Score: 17   End of Session Nurse Communication: Mobility status  Activity Tolerance: Patient tolerated treatment well Patient left: in bed;with call bell/phone within reach;with bed alarm set  OT Visit Diagnosis: Other abnormalities of gait and mobility (R26.89)                Time: 1610-9604 OT Time Calculation (min): 20 min Charges:  OT General Charges $OT Visit: 1 Visit OT Evaluation $OT Eval Low Complexity: 1 Low  Jakorian Marengo, OTR/L 07/24/23, 2:15 PM  Hailly Fess E Kenner Lewan 07/24/2023, 2:12 PM

## 2023-07-24 NOTE — Progress Notes (Signed)
 PT Cancellation Note  Patient Details Name: Lynn Robbins MRN: [id number] DOB: [birth year]   Cancelled Treatment:    Reason Eval/Treat Not Completed: PT screened, no needs identified, will sign off Per OT, pt is at baseline level of mobility of sitting EOB without assistance, hoyer lift transfers to recliner. PT to complete current orders at this time, MD made aware.  Emaline Handsome, PT, DPT 07/24/23, 10:29 AM   [name] 07/24/2023, 10:29 AM

## 2023-07-24 NOTE — Care Management Obs Status (Signed)
 MEDICARE OBSERVATION STATUS NOTIFICATION   Patient Details  Name: Lynn Robbins MRN: [id number] Date of Birth: 02-07-1941   Medicare Observation Status Notification Given:  No (patient did not want a copy)    Anise Kerns 07/24/2023, 10:38 AM

## 2023-07-24 NOTE — TOC Transition Note (Signed)
 Transition of Care [facility]) - Discharge Note   Patient Details  Name: Lynn Robbins MRN: [id number] Date of Birth: May 01, 1940  Transition of Care [facility] CM/SW Contact:  Alexandra Ice, RN Phone Number: 07/24/2023, 10:55 AM   Clinical Narrative:     Patient to discharge today, return to [facility]. Faxed discharge summary and orders to facility, [phone]. Nurse to call report to [phone]. Spoke with Laurel at [facility], pick up scheduled for 12pm. Notified MD and bedside nurse.   Final next level of care: Skilled Nursing Facility Barriers to Discharge: Barriers Resolved   Patient Goals and CMS Choice            Discharge Placement                Patient to be transferred to facility by: [facility] Name of family member notified: Diane Chambers Patient and family notified of of transfer: 07/24/23  Discharge Plan and Services Additional resources added to the After Visit Summary for                    DME Agency: NA       HH Arranged: NA          Social Drivers of Health (SDOH) Interventions SDOH Screenings   Food Insecurity: No Food Insecurity (07/23/2023)  Housing: Low Risk  (07/23/2023)  Transportation Needs: No Transportation Needs (07/23/2023)  Utilities: At Risk (07/23/2023)  Alcohol Screen: Low Risk  (07/22/2021)  Depression (PHQ2-9): Low Risk  (01/19/2021)  Financial Resource Strain: Low Risk  (10/03/2020)  Social Connections: Moderately Integrated (07/23/2023)  Tobacco Use: High Risk (07/22/2023)     Readmission Risk Interventions     No data to display

## 2023-07-24 NOTE — Progress Notes (Signed)
 Civil engineer, contracting [facility]) Liaison Note  New referral for outpatient palliative at [facility] received from [place], TOC.  Referral sent to [facility] referral intake office.  Thank you for allowing participation in this patient's care.  Ambrosio Junker, MA, BSN, RN, FNE Nurse Liaison [phone]

## 2023-07-24 NOTE — NC FL2 (Signed)
 [place]  MEDICAID FL2 LEVEL OF CARE FORM     IDENTIFICATION  Patient Name: Lynn Robbins Birthdate: 1940/03/17 Sex: female Admission Date (Current Location): 07/22/2023  [facility] and [place] Number:  Chiropodist and Address:  [facility], [address], [place], [place] [address]      Provider Number: [id number]  Attending Physician Name and Address:  Brenna Cam, MD  Relative Name and Phone Number:  Daughter: Frutoso Lynn, [phone]    Current Level of Care: Hospital Recommended Level of Care: Skilled Nursing Facility Prior Approval Number:    Date Approved/Denied:   PASRR Number: [id number]  Discharge Plan: SNF    Current Diagnoses: Patient Active Problem List   Diagnosis Date Noted   Severe sepsis (HCC) 07/22/2023   Postural dizziness with presyncope 07/22/2023   Hypotension 07/22/2023   AKI (acute kidney injury) (HCC) 07/22/2023   Lactic acidosis 07/22/2023   Chronic thromboembolic right pulmonary artery (HCC) 07/22/2023   Chronic anticoagulation 07/22/2023   Pneumonia 07/22/2023   Urinary tract infection 07/22/2023   HOH (hard of hearing)    Diabetes mellitus without complication (HCC)    Physical debility 06/18/2022   Impaired gait and mobility 06/18/2022   Chronic respiratory failure (HCC) 05/05/2022   Candidal diaper dermatitis 05/05/2022   Irritant contact dermatitis 05/05/2022   Cognitive deficits 05/05/2022   Cellulitis of jaw 04/09/2022   COPD (chronic obstructive pulmonary disease) (HCC) 09/03/2019   Supplemental oxygen  dependent 09/03/2019   Bilateral hearing loss 03/09/2019   Generalized anxiety disorder 03/09/2019   Goals of care, counseling/discussion 04/18/2018   Mass of upper lobe of left lung 04/18/2018   Intermittent asthma without complication 06/07/2017   Chronic pain disorder 06/07/2017   Hypertension 06/07/2017   Mixed hyperlipidemia 02/03/2017   Hypothyroidism 02/03/2017   Type 2 diabetes  mellitus with hyperglycemia (HCC) 02/03/2017   Dysuria 02/03/2017    Orientation RESPIRATION BLADDER Height & Weight     Self, Time, Situation  Normal Incontinent Weight:   Height:  5\' 2"  (157.5 cm)  BEHAVIORAL SYMPTOMS/MOOD NEUROLOGICAL BOWEL NUTRITION STATUS      Incontinent Diet (Heart healthy, carb-modified)  AMBULATORY STATUS COMMUNICATION OF NEEDS Skin   Extensive Assist Verbally Normal                       Personal Care Assistance Level of Assistance  Bathing, Feeding, Dressing Bathing Assistance: Limited assistance Feeding assistance: Independent Dressing Assistance: Limited assistance     Functional Limitations Info             SPECIAL CARE FACTORS FREQUENCY  PT (By licensed PT), OT (By licensed OT)     PT Frequency: 3 times per week OT Frequency: 3 times per week            Contractures Contractures Info: Not present    Additional Factors Info  Code Status, Isolation Precautions, Allergies Code Status Info: DNR-Limited Allergies Info: NKDA     Isolation Precautions Info: Droplet precautions     Current Medications (07/24/2023):  This is the current hospital active medication list Current Facility-Administered Medications  Medication Dose Route Frequency Provider Last Rate Last Admin   acetaminophen  (TYLENOL ) tablet 650 mg  650 mg Oral Q6H PRN Duncan, Hazel V, MD   650 mg at 07/24/23 8469   Or   acetaminophen  (TYLENOL ) suppository 650 mg  650 mg Rectal Q6H PRN Duncan, Hazel V, MD       albuterol  (PROVENTIL ) (2.5 MG/3ML) 0.083%  nebulizer solution 2.5 mg  2.5 mg Nebulization Q2H PRN Duncan, Hazel V, MD   2.5 mg at 07/24/23 1610   apixaban  (ELIQUIS ) tablet 5 mg  5 mg Oral BID Duncan, Hazel V, MD   5 mg at 07/24/23 9604   budesonide -glycopyrrolate -formoterol  (BREZTRI ) 160-9-4.8 MCG/ACT inhaler 2 puff  2 puff Inhalation QHS Duncan, Hazel V, MD   2 puff at 07/23/23 2131   cefTRIAXone  (ROCEPHIN ) 1 g in sodium chloride  0.9 % 100 mL IVPB  1 g Intravenous  Q24H Shah, Vipul, MD 200 mL/hr at 07/23/23 1651 1 g at 07/23/23 1651   clonazePAM  (KLONOPIN ) tablet 1 mg  1 mg Oral BID PRN Duncan, Hazel V, MD       HYDROcodone -acetaminophen  (NORCO/VICODIN) 5-325 MG per tablet 1-2 tablet  1-2 tablet Oral Q6H PRN Brenna Cam, MD       insulin  aspart (novoLOG ) injection 0-15 Units  0-15 Units Subcutaneous TID WC Duncan, Hazel V, MD       insulin  aspart (novoLOG ) injection 0-5 Units  0-5 Units Subcutaneous QHS Lanetta Pion, MD       magic mouthwash  10 mL Oral QID PRN Duncan, Hazel V, MD       magnesium  oxide (MAG-OX) tablet 400 mg  400 mg Oral TID Duncan, Hazel V, MD   400 mg at 07/24/23 0930   menthol -cetylpyridinium (CEPACOL) lozenge 3 mg  1 lozenge Oral PRN Lanetta Pion, MD       ondansetron  (ZOFRAN ) tablet 4 mg  4 mg Oral Q6H PRN Duncan, Hazel V, MD       Or   ondansetron  (ZOFRAN ) injection 4 mg  4 mg Intravenous Q6H PRN Lanetta Pion, MD       pantoprazole  (PROTONIX ) EC tablet 40 mg  40 mg Oral Daily Duncan, Hazel V, MD   40 mg at 07/24/23 0930   senna-docusate (Senokot-S) tablet 1 tablet  1 tablet Oral QHS PRN Duncan, Hazel V, MD         Discharge Medications: Please see discharge summary for a list of discharge medications.  Relevant Imaging Results:  Relevant Lab Results:   Additional Information ssn: [id number]  Alexandra Ice, RN

## 2023-07-25 DIAGNOSIS — C3490 Malignant neoplasm of unspecified part of unspecified bronchus or lung: Secondary | ICD-10-CM | POA: Diagnosis not present

## 2023-07-25 DIAGNOSIS — Z789 Other specified health status: Secondary | ICD-10-CM | POA: Diagnosis not present

## 2023-07-25 DIAGNOSIS — J9611 Chronic respiratory failure with hypoxia: Secondary | ICD-10-CM | POA: Diagnosis not present

## 2023-07-25 DIAGNOSIS — J449 Chronic obstructive pulmonary disease, unspecified: Secondary | ICD-10-CM | POA: Diagnosis not present

## 2023-07-25 DIAGNOSIS — N390 Urinary tract infection, site not specified: Secondary | ICD-10-CM | POA: Diagnosis not present

## 2023-07-25 LAB — CULTURE, BLOOD (ROUTINE X 2): Special Requests: ADEQUATE

## 2023-07-25 LAB — HEMOGLOBIN A1C
Hgb A1c MFr Bld: 5.7 % — ABNORMAL HIGH (ref 4.8–5.6)
Mean Plasma Glucose: 117 mg/dL

## 2023-07-27 DIAGNOSIS — Z515 Encounter for palliative care: Secondary | ICD-10-CM | POA: Diagnosis not present

## 2023-07-28 LAB — CULTURE, BLOOD (ROUTINE X 2)
Culture: NO GROWTH
Special Requests: ADEQUATE

## 2023-08-01 DIAGNOSIS — I10 Essential (primary) hypertension: Secondary | ICD-10-CM | POA: Diagnosis not present

## 2023-08-01 DIAGNOSIS — K219 Gastro-esophageal reflux disease without esophagitis: Secondary | ICD-10-CM | POA: Diagnosis not present

## 2023-08-01 DIAGNOSIS — E119 Type 2 diabetes mellitus without complications: Secondary | ICD-10-CM | POA: Diagnosis not present

## 2023-08-01 DIAGNOSIS — H269 Unspecified cataract: Secondary | ICD-10-CM | POA: Diagnosis not present

## 2023-08-01 DIAGNOSIS — I5032 Chronic diastolic (congestive) heart failure: Secondary | ICD-10-CM | POA: Diagnosis not present

## 2023-08-01 DIAGNOSIS — J449 Chronic obstructive pulmonary disease, unspecified: Secondary | ICD-10-CM | POA: Diagnosis not present

## 2023-08-09 DIAGNOSIS — H2513 Age-related nuclear cataract, bilateral: Secondary | ICD-10-CM | POA: Diagnosis not present

## 2023-08-09 DIAGNOSIS — I10 Essential (primary) hypertension: Secondary | ICD-10-CM | POA: Diagnosis not present

## 2023-08-09 DIAGNOSIS — Z79899 Other long term (current) drug therapy: Secondary | ICD-10-CM | POA: Diagnosis not present

## 2023-08-09 DIAGNOSIS — E1136 Type 2 diabetes mellitus with diabetic cataract: Secondary | ICD-10-CM | POA: Diagnosis not present

## 2023-08-09 DIAGNOSIS — J4489 Other specified chronic obstructive pulmonary disease: Secondary | ICD-10-CM | POA: Diagnosis not present

## 2023-08-17 DIAGNOSIS — J029 Acute pharyngitis, unspecified: Secondary | ICD-10-CM | POA: Diagnosis not present

## 2023-08-17 DIAGNOSIS — G8929 Other chronic pain: Secondary | ICD-10-CM | POA: Diagnosis not present

## 2023-08-22 DIAGNOSIS — R1311 Dysphagia, oral phase: Secondary | ICD-10-CM | POA: Diagnosis not present

## 2023-09-05 DIAGNOSIS — Z515 Encounter for palliative care: Secondary | ICD-10-CM | POA: Diagnosis not present

## 2023-09-13 DIAGNOSIS — G894 Chronic pain syndrome: Secondary | ICD-10-CM | POA: Diagnosis not present

## 2023-09-19 DIAGNOSIS — H9212 Otorrhea, left ear: Secondary | ICD-10-CM | POA: Diagnosis not present

## 2023-09-19 DIAGNOSIS — H6123 Impacted cerumen, bilateral: Secondary | ICD-10-CM | POA: Diagnosis not present

## 2023-10-03 DIAGNOSIS — Z515 Encounter for palliative care: Secondary | ICD-10-CM | POA: Diagnosis not present

## 2023-10-06 DIAGNOSIS — J449 Chronic obstructive pulmonary disease, unspecified: Secondary | ICD-10-CM | POA: Diagnosis not present

## 2023-10-06 DIAGNOSIS — Z7189 Other specified counseling: Secondary | ICD-10-CM | POA: Diagnosis not present

## 2023-10-11 DIAGNOSIS — G8929 Other chronic pain: Secondary | ICD-10-CM | POA: Diagnosis not present

## 2023-10-27 DIAGNOSIS — E1151 Type 2 diabetes mellitus with diabetic peripheral angiopathy without gangrene: Secondary | ICD-10-CM | POA: Diagnosis not present

## 2023-10-27 DIAGNOSIS — L603 Nail dystrophy: Secondary | ICD-10-CM | POA: Diagnosis not present

## 2023-10-27 DIAGNOSIS — Z7984 Long term (current) use of oral hypoglycemic drugs: Secondary | ICD-10-CM | POA: Diagnosis not present

## 2023-10-27 DIAGNOSIS — L602 Onychogryphosis: Secondary | ICD-10-CM | POA: Diagnosis not present

## 2023-11-09 DIAGNOSIS — K5900 Constipation, unspecified: Secondary | ICD-10-CM | POA: Diagnosis not present

## 2023-11-09 DIAGNOSIS — J9611 Chronic respiratory failure with hypoxia: Secondary | ICD-10-CM | POA: Diagnosis not present

## 2023-11-09 DIAGNOSIS — I2782 Chronic pulmonary embolism: Secondary | ICD-10-CM | POA: Diagnosis not present

## 2023-11-09 DIAGNOSIS — G894 Chronic pain syndrome: Secondary | ICD-10-CM | POA: Diagnosis not present

## 2023-11-09 DIAGNOSIS — I10 Essential (primary) hypertension: Secondary | ICD-10-CM | POA: Diagnosis not present

## 2023-11-09 DIAGNOSIS — J449 Chronic obstructive pulmonary disease, unspecified: Secondary | ICD-10-CM | POA: Diagnosis not present

## 2023-11-09 DIAGNOSIS — E119 Type 2 diabetes mellitus without complications: Secondary | ICD-10-CM | POA: Diagnosis not present

## 2023-11-14 DIAGNOSIS — G8929 Other chronic pain: Secondary | ICD-10-CM | POA: Diagnosis not present

## 2023-11-15 DIAGNOSIS — R532 Functional quadriplegia: Secondary | ICD-10-CM | POA: Diagnosis not present

## 2023-11-15 DIAGNOSIS — J449 Chronic obstructive pulmonary disease, unspecified: Secondary | ICD-10-CM | POA: Diagnosis not present

## 2023-12-01 DIAGNOSIS — J449 Chronic obstructive pulmonary disease, unspecified: Secondary | ICD-10-CM | POA: Diagnosis not present

## 2023-12-01 DIAGNOSIS — I10 Essential (primary) hypertension: Secondary | ICD-10-CM | POA: Diagnosis not present

## 2023-12-01 DIAGNOSIS — K5900 Constipation, unspecified: Secondary | ICD-10-CM | POA: Diagnosis not present

## 2023-12-01 DIAGNOSIS — E559 Vitamin D deficiency, unspecified: Secondary | ICD-10-CM | POA: Diagnosis not present

## 2023-12-01 DIAGNOSIS — E1165 Type 2 diabetes mellitus with hyperglycemia: Secondary | ICD-10-CM | POA: Diagnosis not present

## 2023-12-01 DIAGNOSIS — G894 Chronic pain syndrome: Secondary | ICD-10-CM | POA: Diagnosis not present

## 2023-12-06 DIAGNOSIS — J449 Chronic obstructive pulmonary disease, unspecified: Secondary | ICD-10-CM | POA: Diagnosis not present

## 2023-12-06 DIAGNOSIS — S51812A Laceration without foreign body of left forearm, initial encounter: Secondary | ICD-10-CM | POA: Diagnosis not present

## 2023-12-17 DEATH — deceased
